# Patient Record
Sex: Male | Born: 1935 | Race: White | Hispanic: No | Smoking: Former smoker
Health system: Southern US, Community
[De-identification: ages and names within clinical notes are randomized; demographics above are authoritative.]

## PROBLEM LIST (undated history)

## (undated) DIAGNOSIS — E871 Hypo-osmolality and hyponatremia: Secondary | ICD-10-CM

## (undated) DIAGNOSIS — Z789 Other specified health status: Secondary | ICD-10-CM

## (undated) DIAGNOSIS — J439 Emphysema, unspecified: Secondary | ICD-10-CM

## (undated) DIAGNOSIS — N183 Chronic kidney disease, stage 3 (moderate): Secondary | ICD-10-CM

## (undated) DIAGNOSIS — I10 Essential (primary) hypertension: Secondary | ICD-10-CM

## (undated) DIAGNOSIS — I2510 Atherosclerotic heart disease of native coronary artery without angina pectoris: Secondary | ICD-10-CM

## (undated) DIAGNOSIS — E785 Hyperlipidemia, unspecified: Secondary | ICD-10-CM

## (undated) DIAGNOSIS — I700 Atherosclerosis of aorta: Secondary | ICD-10-CM

## (undated) DIAGNOSIS — C3492 Malignant neoplasm of unspecified part of left bronchus or lung: Secondary | ICD-10-CM

## (undated) DIAGNOSIS — F419 Anxiety disorder, unspecified: Secondary | ICD-10-CM

## (undated) DIAGNOSIS — C3490 Malignant neoplasm of unspecified part of unspecified bronchus or lung: Secondary | ICD-10-CM

## (undated) HISTORY — DX: Emphysema, unspecified: J43.9

## (undated) HISTORY — DX: Hypo-osmolality and hyponatremia: E87.1

## (undated) HISTORY — DX: Atherosclerosis of aorta: I70.0

## (undated) HISTORY — DX: Hyperlipidemia, unspecified: E78.5

## (undated) HISTORY — DX: Malignant neoplasm of unspecified part of unspecified bronchus or lung: C34.90

## (undated) HISTORY — DX: Essential (primary) hypertension: I10

## (undated) HISTORY — DX: Malignant neoplasm of unspecified part of left bronchus or lung: C34.92

## (undated) HISTORY — DX: Other specified health status: Z78.9

## (undated) HISTORY — PX: CORONARY STENT PLACEMENT: SHX1402

## (undated) HISTORY — DX: Anxiety disorder, unspecified: F41.9

## (undated) HISTORY — DX: Atherosclerotic heart disease of native coronary artery without angina pectoris: I25.10

## (undated) HISTORY — PX: PNEUMONECTOMY: SHX168

## (undated) HISTORY — DX: Chronic kidney disease, stage 3 (moderate): N18.3

---

## 2004-02-18 DIAGNOSIS — C3490 Malignant neoplasm of unspecified part of unspecified bronchus or lung: Secondary | ICD-10-CM

## 2004-02-18 HISTORY — DX: Malignant neoplasm of unspecified part of unspecified bronchus or lung: C34.90

## 2004-09-02 ENCOUNTER — Ambulatory Visit: Payer: Self-pay | Admitting: Internal Medicine

## 2004-09-09 ENCOUNTER — Ambulatory Visit: Payer: Self-pay | Admitting: Internal Medicine

## 2004-09-10 ENCOUNTER — Ambulatory Visit: Payer: Self-pay | Admitting: Cardiology

## 2004-09-23 ENCOUNTER — Ambulatory Visit: Payer: Self-pay | Admitting: Internal Medicine

## 2004-09-26 ENCOUNTER — Ambulatory Visit: Admission: RE | Admit: 2004-09-26 | Discharge: 2004-09-26 | Payer: Self-pay | Admitting: Internal Medicine

## 2004-09-26 ENCOUNTER — Encounter (INDEPENDENT_AMBULATORY_CARE_PROVIDER_SITE_OTHER): Payer: Self-pay | Admitting: Specialist

## 2004-09-26 ENCOUNTER — Ambulatory Visit: Payer: Self-pay | Admitting: Internal Medicine

## 2004-10-02 ENCOUNTER — Ambulatory Visit: Payer: Self-pay | Admitting: Internal Medicine

## 2004-10-18 ENCOUNTER — Ambulatory Visit (HOSPITAL_COMMUNITY): Admission: RE | Admit: 2004-10-18 | Discharge: 2004-10-18 | Payer: Self-pay | Admitting: Thoracic Surgery

## 2004-10-22 ENCOUNTER — Ambulatory Visit (HOSPITAL_COMMUNITY): Admission: RE | Admit: 2004-10-22 | Discharge: 2004-10-22 | Payer: Self-pay | Admitting: Thoracic Surgery

## 2004-10-22 ENCOUNTER — Encounter (INDEPENDENT_AMBULATORY_CARE_PROVIDER_SITE_OTHER): Payer: Self-pay | Admitting: *Deleted

## 2004-10-24 ENCOUNTER — Ambulatory Visit: Payer: Self-pay | Admitting: Internal Medicine

## 2004-12-04 ENCOUNTER — Encounter: Admission: RE | Admit: 2004-12-04 | Discharge: 2004-12-04 | Payer: Self-pay | Admitting: Thoracic Surgery

## 2004-12-12 ENCOUNTER — Ambulatory Visit: Payer: Self-pay | Admitting: Internal Medicine

## 2005-01-16 ENCOUNTER — Ambulatory Visit (HOSPITAL_COMMUNITY): Admission: RE | Admit: 2005-01-16 | Discharge: 2005-01-16 | Payer: Self-pay | Admitting: Internal Medicine

## 2005-02-19 ENCOUNTER — Ambulatory Visit: Payer: Self-pay | Admitting: Internal Medicine

## 2005-02-21 ENCOUNTER — Inpatient Hospital Stay (HOSPITAL_COMMUNITY): Admission: RE | Admit: 2005-02-21 | Discharge: 2005-02-27 | Payer: Self-pay | Admitting: Thoracic Surgery

## 2005-02-21 ENCOUNTER — Encounter (INDEPENDENT_AMBULATORY_CARE_PROVIDER_SITE_OTHER): Payer: Self-pay | Admitting: *Deleted

## 2005-03-05 ENCOUNTER — Encounter: Admission: RE | Admit: 2005-03-05 | Discharge: 2005-03-05 | Payer: Self-pay | Admitting: Thoracic Surgery

## 2005-03-26 ENCOUNTER — Encounter: Admission: RE | Admit: 2005-03-26 | Discharge: 2005-03-26 | Payer: Self-pay | Admitting: Thoracic Surgery

## 2005-04-07 ENCOUNTER — Ambulatory Visit: Payer: Self-pay | Admitting: Internal Medicine

## 2005-04-16 ENCOUNTER — Encounter: Admission: RE | Admit: 2005-04-16 | Discharge: 2005-04-16 | Payer: Self-pay | Admitting: Thoracic Surgery

## 2005-05-20 LAB — COMPREHENSIVE METABOLIC PANEL
ALT: 10 U/L (ref 0–40)
Albumin: 4.1 g/dL (ref 3.5–5.2)
BUN: 16 mg/dL (ref 6–23)
CO2: 22 mEq/L (ref 19–32)
Calcium: 9.3 mg/dL (ref 8.4–10.5)
Chloride: 102 mEq/L (ref 96–112)
Creatinine, Ser: 0.9 mg/dL (ref 0.4–1.5)
Potassium: 6.5 mEq/L (ref 3.5–5.3)

## 2005-05-20 LAB — CBC WITH DIFFERENTIAL/PLATELET
Basophils Absolute: 0.1 10*3/uL (ref 0.0–0.1)
HCT: 38.4 % — ABNORMAL LOW (ref 38.7–49.9)
HGB: 13.1 g/dL (ref 13.0–17.1)
MONO#: 1.1 10*3/uL — ABNORMAL HIGH (ref 0.1–0.9)
NEUT#: 5.1 10*3/uL (ref 1.5–6.5)
NEUT%: 64.3 % (ref 40.0–75.0)
RDW: 17 % — ABNORMAL HIGH (ref 11.2–14.6)
WBC: 7.9 10*3/uL (ref 4.0–10.0)
lymph#: 1.6 10*3/uL (ref 0.9–3.3)

## 2005-05-28 ENCOUNTER — Encounter: Admission: RE | Admit: 2005-05-28 | Discharge: 2005-05-28 | Payer: Self-pay | Admitting: Thoracic Surgery

## 2005-06-11 ENCOUNTER — Ambulatory Visit (HOSPITAL_COMMUNITY): Admission: RE | Admit: 2005-06-11 | Discharge: 2005-06-11 | Payer: Self-pay | Admitting: Internal Medicine

## 2005-06-15 ENCOUNTER — Ambulatory Visit: Payer: Self-pay | Admitting: Internal Medicine

## 2005-06-17 LAB — COMPREHENSIVE METABOLIC PANEL
ALT: 8 U/L (ref 0–40)
Albumin: 4.2 g/dL (ref 3.5–5.2)
CO2: 25 mEq/L (ref 19–32)
Calcium: 9.3 mg/dL (ref 8.4–10.5)
Chloride: 102 mEq/L (ref 96–112)
Creatinine, Ser: 1.1 mg/dL (ref 0.4–1.5)
Total Protein: 7.4 g/dL (ref 6.0–8.3)

## 2005-06-17 LAB — CBC WITH DIFFERENTIAL/PLATELET
BASO%: 0.3 % (ref 0.0–2.0)
HCT: 38.1 % — ABNORMAL LOW (ref 38.7–49.9)
MCHC: 34.2 g/dL (ref 32.0–35.9)
MONO#: 0.9 10*3/uL (ref 0.1–0.9)
RBC: 3.95 10*6/uL — ABNORMAL LOW (ref 4.20–5.71)
RDW: 21.3 % — ABNORMAL HIGH (ref 11.2–14.6)
WBC: 6.2 10*3/uL (ref 4.0–10.0)
lymph#: 1.1 10*3/uL (ref 0.9–3.3)

## 2005-07-21 ENCOUNTER — Ambulatory Visit (HOSPITAL_COMMUNITY): Admission: RE | Admit: 2005-07-21 | Discharge: 2005-07-21 | Payer: Self-pay | Admitting: Internal Medicine

## 2005-09-04 ENCOUNTER — Ambulatory Visit: Payer: Self-pay | Admitting: Internal Medicine

## 2005-09-09 LAB — COMPREHENSIVE METABOLIC PANEL
ALT: 9 U/L (ref 0–40)
Albumin: 4.2 g/dL (ref 3.5–5.2)
Alkaline Phosphatase: 52 U/L (ref 39–117)
Glucose, Bld: 90 mg/dL (ref 70–99)
Potassium: 4.4 mEq/L (ref 3.5–5.3)
Sodium: 138 mEq/L (ref 135–145)
Total Bilirubin: 0.6 mg/dL (ref 0.3–1.2)
Total Protein: 6.9 g/dL (ref 6.0–8.3)

## 2005-09-09 LAB — CBC WITH DIFFERENTIAL/PLATELET
BASO%: 0.4 % (ref 0.0–2.0)
Eosinophils Absolute: 0.2 10*3/uL (ref 0.0–0.5)
MCHC: 34.3 g/dL (ref 32.0–35.9)
MCV: 96.2 fL (ref 81.6–98.0)
MONO#: 0.7 10*3/uL (ref 0.1–0.9)
MONO%: 9.4 % (ref 0.0–13.0)
NEUT#: 4.9 10*3/uL (ref 1.5–6.5)
RBC: 4.42 10*6/uL (ref 4.20–5.71)
RDW: 13.6 % (ref 11.2–14.6)
WBC: 7.7 10*3/uL (ref 4.0–10.0)

## 2005-09-11 ENCOUNTER — Ambulatory Visit (HOSPITAL_COMMUNITY): Admission: RE | Admit: 2005-09-11 | Discharge: 2005-09-11 | Payer: Self-pay | Admitting: Internal Medicine

## 2005-12-05 ENCOUNTER — Ambulatory Visit: Payer: Self-pay | Admitting: Internal Medicine

## 2005-12-09 LAB — CBC WITH DIFFERENTIAL/PLATELET
Eosinophils Absolute: 0.3 10*3/uL (ref 0.0–0.5)
MONO#: 0.8 10*3/uL (ref 0.1–0.9)
NEUT#: 3.9 10*3/uL (ref 1.5–6.5)
Platelets: 298 10*3/uL (ref 145–400)
RBC: 4.42 10*6/uL (ref 4.20–5.71)
RDW: 14.3 % (ref 11.2–14.6)
WBC: 6.4 10*3/uL (ref 4.0–10.0)
lymph#: 1.5 10*3/uL (ref 0.9–3.3)

## 2005-12-09 LAB — COMPREHENSIVE METABOLIC PANEL
ALT: 9 U/L (ref 0–40)
Albumin: 4.1 g/dL (ref 3.5–5.2)
CO2: 27 mEq/L (ref 19–32)
Glucose, Bld: 94 mg/dL (ref 70–99)
Potassium: 4.3 mEq/L (ref 3.5–5.3)
Sodium: 137 mEq/L (ref 135–145)
Total Protein: 6.8 g/dL (ref 6.0–8.3)

## 2005-12-11 ENCOUNTER — Ambulatory Visit (HOSPITAL_COMMUNITY): Admission: RE | Admit: 2005-12-11 | Discharge: 2005-12-11 | Payer: Self-pay | Admitting: Internal Medicine

## 2006-03-04 ENCOUNTER — Ambulatory Visit: Payer: Self-pay | Admitting: Internal Medicine

## 2006-03-04 ENCOUNTER — Ambulatory Visit: Payer: Self-pay | Admitting: Cardiology

## 2006-03-04 ENCOUNTER — Observation Stay (HOSPITAL_COMMUNITY): Admission: EM | Admit: 2006-03-04 | Discharge: 2006-03-05 | Payer: Self-pay | Admitting: Emergency Medicine

## 2006-03-09 ENCOUNTER — Ambulatory Visit: Payer: Self-pay

## 2006-03-09 LAB — CBC WITH DIFFERENTIAL/PLATELET
BASO%: 0.2 % (ref 0.0–2.0)
EOS%: 1.2 % (ref 0.0–7.0)
HCT: 48.1 % (ref 38.7–49.9)
MCH: 32.7 pg (ref 28.0–33.4)
MCHC: 34.1 g/dL (ref 32.0–35.9)
MONO#: 0.8 10*3/uL (ref 0.1–0.9)
NEUT%: 72.1 % (ref 40.0–75.0)
RDW: 13.5 % (ref 11.2–14.6)
WBC: 8 10*3/uL (ref 4.0–10.0)
lymph#: 1.3 10*3/uL (ref 0.9–3.3)

## 2006-03-09 LAB — COMPREHENSIVE METABOLIC PANEL
CO2: 28 mEq/L (ref 19–32)
Creatinine, Ser: 1.3 mg/dL (ref 0.40–1.50)
Glucose, Bld: 92 mg/dL (ref 70–99)
Total Bilirubin: 0.8 mg/dL (ref 0.3–1.2)

## 2006-03-11 ENCOUNTER — Ambulatory Visit (HOSPITAL_COMMUNITY): Admission: RE | Admit: 2006-03-11 | Discharge: 2006-03-11 | Payer: Self-pay | Admitting: Internal Medicine

## 2006-03-12 ENCOUNTER — Ambulatory Visit: Payer: Self-pay | Admitting: Cardiology

## 2006-03-12 LAB — CONVERTED CEMR LAB
BUN: 13 mg/dL (ref 6–23)
Calcium: 9.8 mg/dL (ref 8.4–10.5)
Chloride: 99 meq/L (ref 96–112)
Creatinine, Ser: 1.4 mg/dL (ref 0.4–1.5)
INR: 1 (ref 0.9–2.0)
MCV: 96.3 fL (ref 78.0–100.0)
Prothrombin Time: 12.1 s (ref 10.0–14.0)
RDW: 12.7 % (ref 11.5–14.6)
Sodium: 137 meq/L (ref 135–145)
WBC: 8.9 10*3/uL (ref 4.5–10.5)
aPTT: 32.1 s (ref 26.5–36.5)

## 2006-03-13 ENCOUNTER — Inpatient Hospital Stay (HOSPITAL_BASED_OUTPATIENT_CLINIC_OR_DEPARTMENT_OTHER): Admission: RE | Admit: 2006-03-13 | Discharge: 2006-03-13 | Payer: Self-pay | Admitting: Cardiology

## 2006-03-13 ENCOUNTER — Ambulatory Visit: Payer: Self-pay | Admitting: Cardiology

## 2006-03-13 DIAGNOSIS — I2510 Atherosclerotic heart disease of native coronary artery without angina pectoris: Secondary | ICD-10-CM | POA: Insufficient documentation

## 2006-03-13 HISTORY — DX: Atherosclerotic heart disease of native coronary artery without angina pectoris: I25.10

## 2006-03-17 ENCOUNTER — Ambulatory Visit: Payer: Self-pay | Admitting: Cardiology

## 2006-03-17 ENCOUNTER — Inpatient Hospital Stay (HOSPITAL_COMMUNITY): Admission: AD | Admit: 2006-03-17 | Discharge: 2006-03-18 | Payer: Self-pay | Admitting: Cardiology

## 2006-03-17 DIAGNOSIS — Z9861 Coronary angioplasty status: Secondary | ICD-10-CM

## 2006-03-18 DIAGNOSIS — I10 Essential (primary) hypertension: Secondary | ICD-10-CM

## 2006-03-18 HISTORY — DX: Essential (primary) hypertension: I10

## 2006-03-30 ENCOUNTER — Ambulatory Visit: Payer: Self-pay | Admitting: Cardiology

## 2006-06-03 ENCOUNTER — Ambulatory Visit: Payer: Self-pay | Admitting: Internal Medicine

## 2006-06-08 LAB — CBC WITH DIFFERENTIAL/PLATELET
Basophils Absolute: 0 10*3/uL (ref 0.0–0.1)
Eosinophils Absolute: 0.1 10*3/uL (ref 0.0–0.5)
HCT: 44.1 % (ref 38.7–49.9)
LYMPH%: 18.1 % (ref 14.0–48.0)
MONO#: 1.1 10*3/uL — ABNORMAL HIGH (ref 0.1–0.9)
NEUT#: 3.9 10*3/uL (ref 1.5–6.5)
NEUT%: 62.3 % (ref 40.0–75.0)
Platelets: 255 10*3/uL (ref 145–400)
WBC: 6.2 10*3/uL (ref 4.0–10.0)

## 2006-06-08 LAB — COMPREHENSIVE METABOLIC PANEL
CO2: 27 mEq/L (ref 19–32)
Creatinine, Ser: 1.31 mg/dL (ref 0.40–1.50)
Glucose, Bld: 105 mg/dL — ABNORMAL HIGH (ref 70–99)
Total Bilirubin: 0.5 mg/dL (ref 0.3–1.2)
Total Protein: 7.3 g/dL (ref 6.0–8.3)

## 2006-06-10 ENCOUNTER — Ambulatory Visit (HOSPITAL_COMMUNITY): Admission: RE | Admit: 2006-06-10 | Discharge: 2006-06-10 | Payer: Self-pay | Admitting: Internal Medicine

## 2006-06-30 ENCOUNTER — Ambulatory Visit: Payer: Self-pay | Admitting: Internal Medicine

## 2006-06-30 LAB — CONVERTED CEMR LAB
BUN: 11 mg/dL (ref 6–23)
Creatinine, Ser: 1.4 mg/dL (ref 0.4–1.5)
Potassium: 4.1 meq/L (ref 3.5–5.1)

## 2006-09-30 ENCOUNTER — Ambulatory Visit: Payer: Self-pay | Admitting: Internal Medicine

## 2006-10-06 LAB — COMPREHENSIVE METABOLIC PANEL
Albumin: 4.3 g/dL (ref 3.5–5.2)
CO2: 25 mEq/L (ref 19–32)
Calcium: 9.6 mg/dL (ref 8.4–10.5)
Chloride: 103 mEq/L (ref 96–112)
Glucose, Bld: 77 mg/dL (ref 70–99)
Potassium: 4.6 mEq/L (ref 3.5–5.3)
Sodium: 136 mEq/L (ref 135–145)
Total Bilirubin: 0.5 mg/dL (ref 0.3–1.2)
Total Protein: 7 g/dL (ref 6.0–8.3)

## 2006-10-06 LAB — CBC WITH DIFFERENTIAL/PLATELET
Eosinophils Absolute: 0.1 10*3/uL (ref 0.0–0.5)
HCT: 40.5 % (ref 38.7–49.9)
LYMPH%: 15.1 % (ref 14.0–48.0)
MONO#: 0.8 10*3/uL (ref 0.1–0.9)
NEUT#: 6.3 10*3/uL (ref 1.5–6.5)
Platelets: 260 10*3/uL (ref 145–400)
RBC: 4.3 10*6/uL (ref 4.20–5.71)
WBC: 8.4 10*3/uL (ref 4.0–10.0)

## 2006-10-12 ENCOUNTER — Ambulatory Visit (HOSPITAL_COMMUNITY): Admission: RE | Admit: 2006-10-12 | Discharge: 2006-10-12 | Payer: Self-pay | Admitting: Internal Medicine

## 2006-10-13 ENCOUNTER — Encounter: Payer: Self-pay | Admitting: Internal Medicine

## 2006-11-04 ENCOUNTER — Ambulatory Visit: Payer: Self-pay | Admitting: Cardiology

## 2006-11-04 LAB — CONVERTED CEMR LAB
BUN: 12 mg/dL (ref 6–23)
GFR calc Af Amer: 77 mL/min
GFR calc non Af Amer: 64 mL/min
Potassium: 4.4 meq/L (ref 3.5–5.1)
Sodium: 144 meq/L (ref 135–145)

## 2006-11-16 ENCOUNTER — Ambulatory Visit: Payer: Self-pay | Admitting: Cardiology

## 2006-11-30 ENCOUNTER — Ambulatory Visit: Payer: Self-pay | Admitting: Cardiology

## 2006-11-30 LAB — CONVERTED CEMR LAB
CO2: 30 meq/L (ref 19–32)
Chloride: 102 meq/L (ref 96–112)
Cholesterol: 202 mg/dL (ref 0–200)
Creatinine, Ser: 1.4 mg/dL (ref 0.4–1.5)
HDL: 80.1 mg/dL (ref >39.0)
Potassium: 4.5 meq/L (ref 3.5–5.1)
Sodium: 137 meq/L (ref 135–145)

## 2007-04-02 ENCOUNTER — Ambulatory Visit: Payer: Self-pay | Admitting: Internal Medicine

## 2007-04-06 ENCOUNTER — Ambulatory Visit (HOSPITAL_COMMUNITY): Admission: RE | Admit: 2007-04-06 | Discharge: 2007-04-06 | Payer: Self-pay | Admitting: Internal Medicine

## 2007-04-06 LAB — COMPREHENSIVE METABOLIC PANEL
Albumin: 3.8 g/dL (ref 3.5–5.2)
CO2: 26 mEq/L (ref 19–32)
Calcium: 9.2 mg/dL (ref 8.4–10.5)
Chloride: 100 mEq/L (ref 96–112)
Glucose, Bld: 109 mg/dL — ABNORMAL HIGH (ref 70–99)
Sodium: 133 mEq/L — ABNORMAL LOW (ref 135–145)
Total Bilirubin: 0.9 mg/dL (ref 0.3–1.2)
Total Protein: 7.1 g/dL (ref 6.0–8.3)

## 2007-04-06 LAB — CBC WITH DIFFERENTIAL/PLATELET
Eosinophils Absolute: 0.2 10*3/uL (ref 0.0–0.5)
HCT: 42.9 % (ref 38.7–49.9)
LYMPH%: 25.9 % (ref 14.0–48.0)
MONO#: 0.8 10*3/uL (ref 0.1–0.9)
NEUT#: 4.3 10*3/uL (ref 1.5–6.5)
Platelets: 280 10*3/uL (ref 145–400)
RBC: 4.57 10*6/uL (ref 4.20–5.71)
WBC: 7.1 10*3/uL (ref 4.0–10.0)

## 2007-04-13 ENCOUNTER — Encounter: Payer: Self-pay | Admitting: Internal Medicine

## 2007-10-01 ENCOUNTER — Ambulatory Visit: Payer: Self-pay | Admitting: Internal Medicine

## 2007-10-05 ENCOUNTER — Ambulatory Visit (HOSPITAL_COMMUNITY): Admission: RE | Admit: 2007-10-05 | Discharge: 2007-10-05 | Payer: Self-pay | Admitting: Internal Medicine

## 2007-10-05 LAB — CBC WITH DIFFERENTIAL/PLATELET
BASO%: 0.2 % (ref 0.0–2.0)
EOS%: 2.3 % (ref 0.0–7.0)
HCT: 40.8 % (ref 38.7–49.9)
LYMPH%: 12.9 % — ABNORMAL LOW (ref 14.0–48.0)
MCH: 33.2 pg (ref 28.0–33.4)
MCHC: 34.3 g/dL (ref 32.0–35.9)
NEUT%: 72 % (ref 40.0–75.0)
Platelets: 304 10*3/uL (ref 145–400)

## 2007-10-05 LAB — COMPREHENSIVE METABOLIC PANEL
ALT: 12 U/L (ref 0–53)
AST: 17 U/L (ref 0–37)
Alkaline Phosphatase: 48 U/L (ref 39–117)
Creatinine, Ser: 1.12 mg/dL (ref 0.40–1.50)
Total Bilirubin: 0.8 mg/dL (ref 0.3–1.2)

## 2007-10-11 ENCOUNTER — Encounter: Payer: Self-pay | Admitting: Internal Medicine

## 2007-11-01 ENCOUNTER — Ambulatory Visit: Payer: Self-pay | Admitting: Cardiology

## 2007-11-14 IMAGING — CT CT CHEST W/ CM
2 of 4 series · 15 of 36 positions shown, 18 images · IV contrast (omnipaque)
Comparison: Plain film 12/04/04, CT chest 09/10/04, and PET of 10/18/04.   Direct comparison will be made to the CT of 09/10/04.

CLINICAL DATA: Lung cancer.  Status post radiation therapy, undergoing chemotherapy.
CHEST CT WITH CONTRAST:
TECHNIQUE: Multidetector CT imaging of the chest was performed following the standard protocol during bolus administration of intravenous contrast.
Contrast:  80 cc Omnipaque 300.

[Series 2: chest_routine 5.0 b40f st · axial · 0.68mm/px · z∈[-311,-41]mm · 12 of 64 slices shown, 15 images]
[im 5/64  mediastinal]
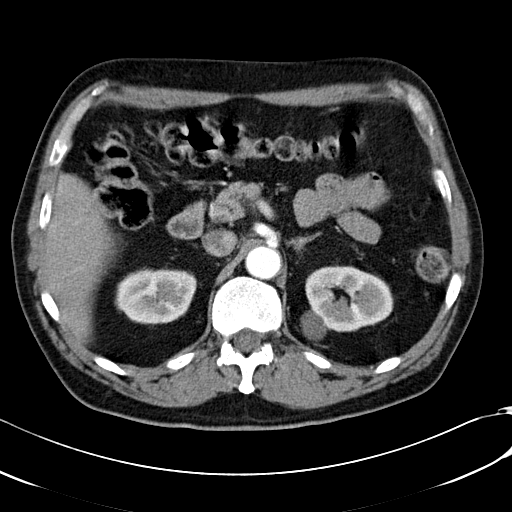
[im 5/64  lung]
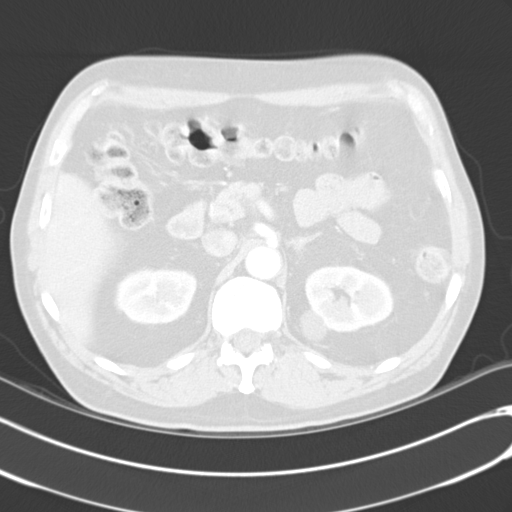
[im 9/64  lung]
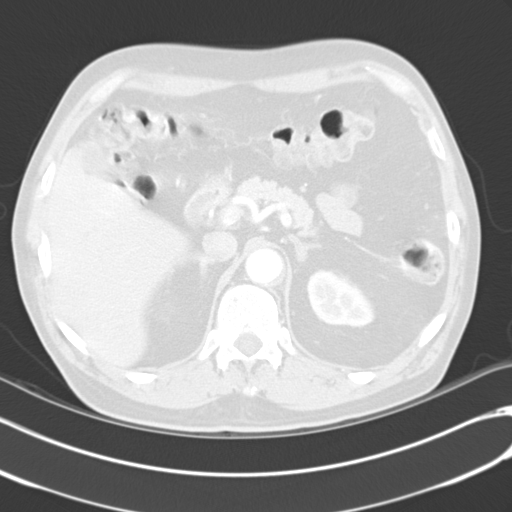
[im 13/64  lung]
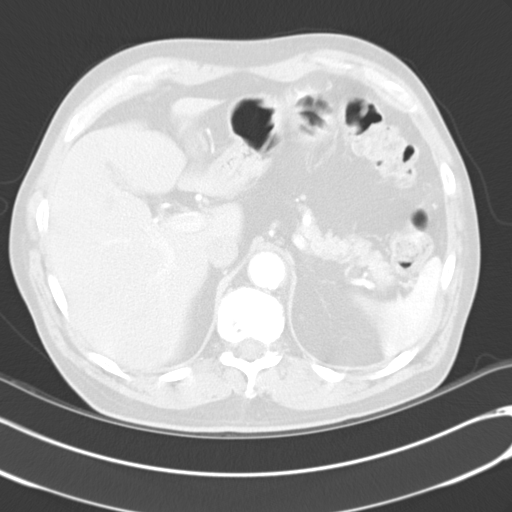
[im 22/64  lung]
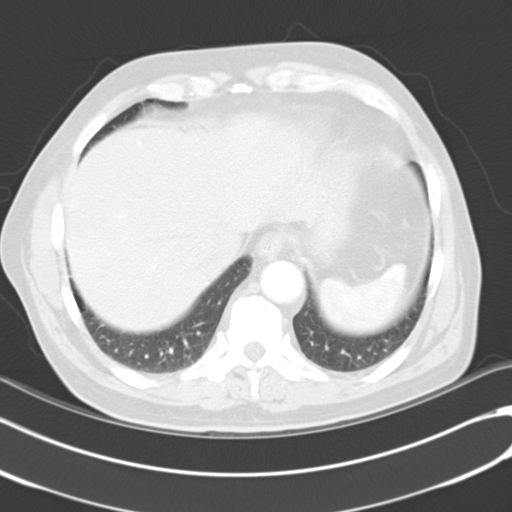
[im 26/64  mediastinal]
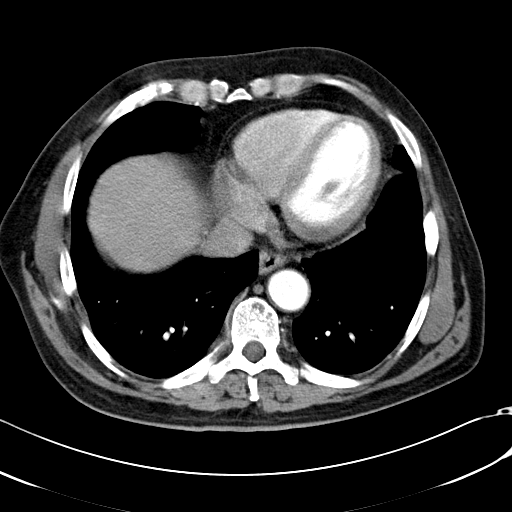
[im 26/64  lung]
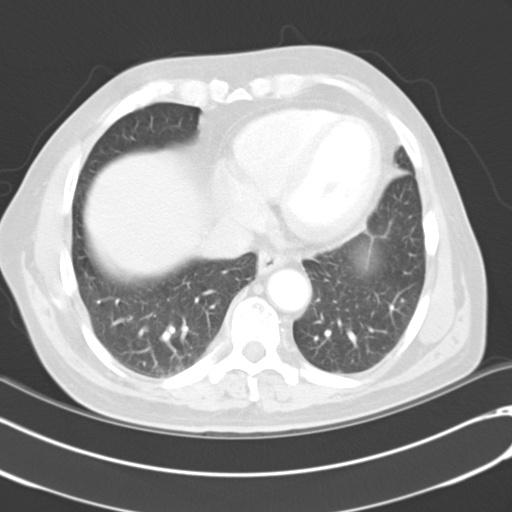
[im 30/64  lung]
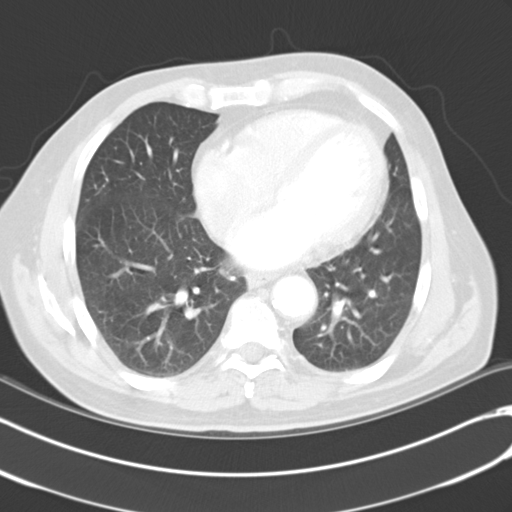
[im 34/64  lung]
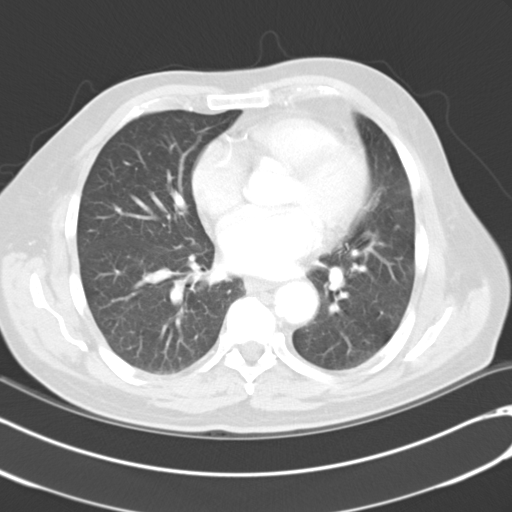
[im 38/64  lung]
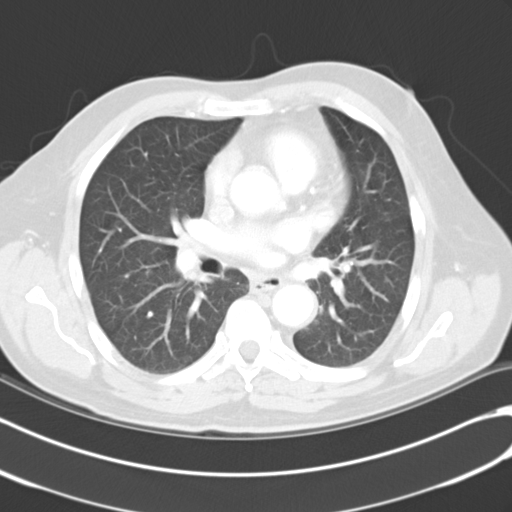
[im 43/64  mediastinal]
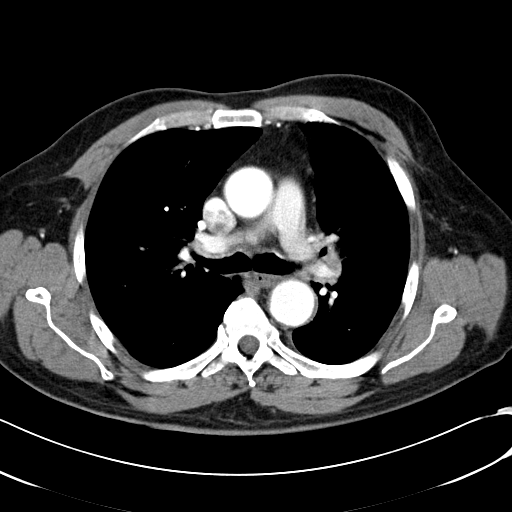
[im 43/64  lung]
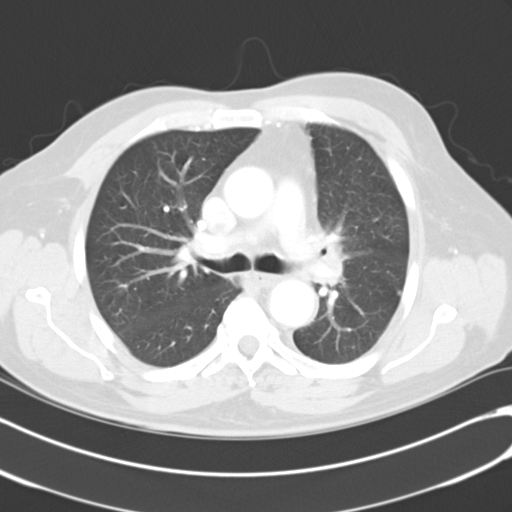
[im 51/64  lung]
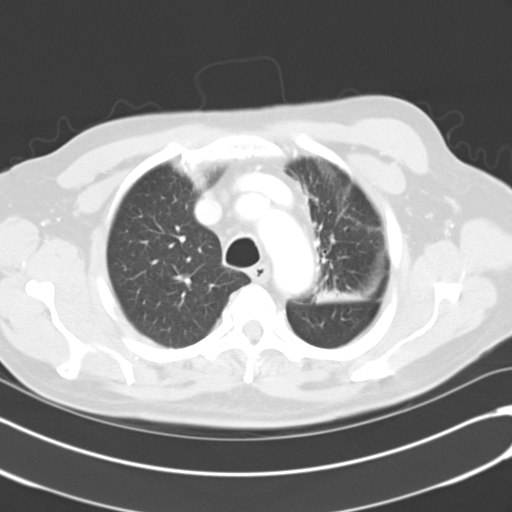
[im 55/64  lung]
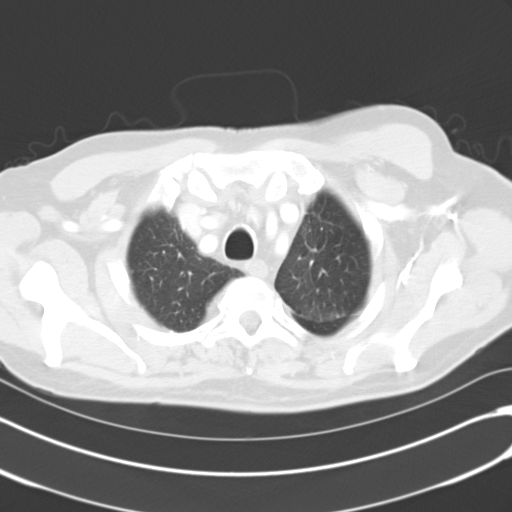
[im 59/64  lung]
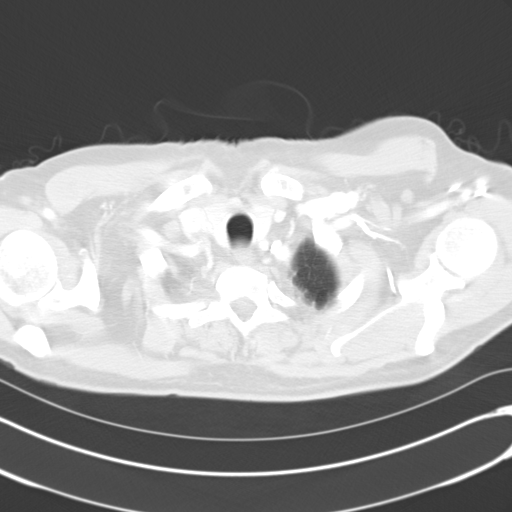

[Series 602: coronal · coronal · 0.68mm/px · 3 of 39 slices shown]
[im 8/39  lung]
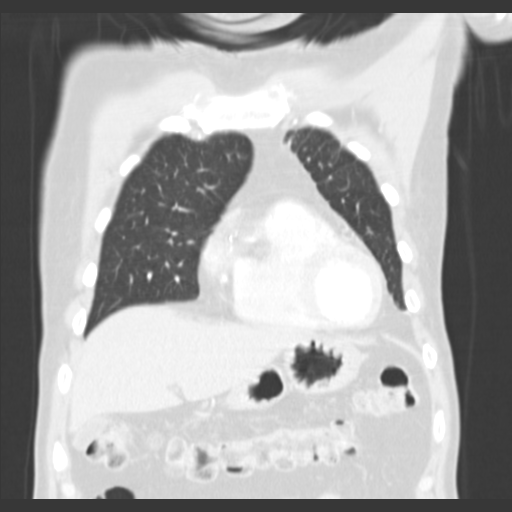
[im 16/39  lung]
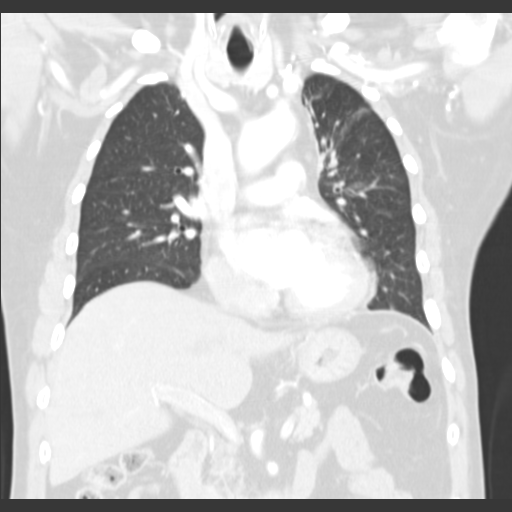
[im 23/39  lung]
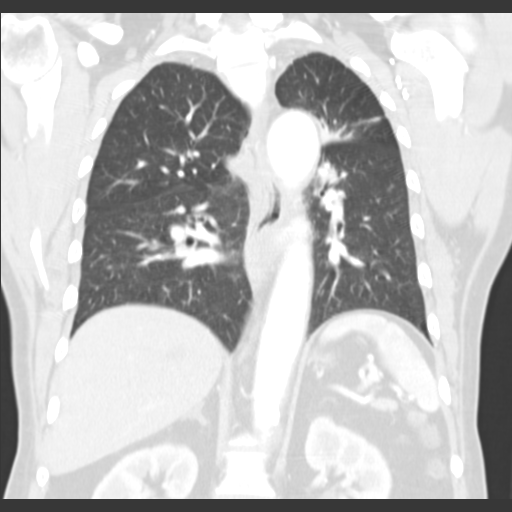

[15 of 36 positions shown; findings below may reference images not displayed]

FINDINGS: Aeration of the airways demonstrates decreased mass effect on the left upper lobe bronchus with similar mass effect on the lingular bronchus due to the tumor described previously.  There is central lobular emphysema.  The nodule along the right major fissure on image 32 today is similar to slightly smaller than on the prior exam and measures approximately 2 mm.  There is a granuloma more centrally in the superior segment right lower lobe.  There is some minimal subpleural nodularity in the left lung posteriorly measuring approximately 2 mm on image 22.  This was similar on the prior exam.  There has been interval improvement in the left upper lobe central mass.  Although somewhat difficult to measure due to its ill-defined nature, at its greatest transverse dimension, at the level of the left upper lobe bronchus, this currently measures 1.7 x 2.8 cm on image 23.  There is only minimal residual soft tissue anterior to the left upper lobe bronchus.  Previously, posterior to the upper lobe bronchus, this measured 2.3 x 3.4 cm with approximately 1.4 cm of tissue seen anterior to the left upper lobe bronchus.  There is improved postobstructive pneumonitis.
Soft tissue windows demonstrate normal heart size without pericardial effusion.  The previous CT described hypermetabolic AP window lymph node currently measures 1.1 by 1.2 cm and measured 1.5 x 1.7 previously.  There is right infrahilar nodule tissue, which is somewhat prominent on image 29 but similar to the prior exam.  There is no pleural effusion.  Limited imaging of the upper abdomen demonstrates old granulomas disease within the liver.  A left renal cyst but acute abnormality.  The adrenal glands are normal.  
Bone windows demonstrate no focal osseous lesion.
IMPRESSION: Overall response to therapy with decreased size of the left upper lobe mass and AP window lymph node.  No evidence of new lesions or metastatic disease.

## 2007-11-19 ENCOUNTER — Ambulatory Visit: Payer: Self-pay | Admitting: Cardiology

## 2007-11-19 LAB — CONVERTED CEMR LAB
BUN: 17 mg/dL (ref 6–23)
Chloride: 96 meq/L (ref 96–112)
Creatinine, Ser: 1.1 mg/dL (ref 0.4–1.5)
Direct LDL: 87.1 mg/dL
GFR calc Af Amer: 85 mL/min
GFR calc non Af Amer: 70 mL/min
HDL: 95 mg/dL (ref >39.0)
Potassium: 4.7 meq/L (ref 3.5–5.1)
Total CHOL/HDL Ratio: 2.2
VLDL: 10 mg/dL (ref 0–40)

## 2008-04-04 ENCOUNTER — Ambulatory Visit: Payer: Self-pay | Admitting: Internal Medicine

## 2008-04-06 ENCOUNTER — Ambulatory Visit (HOSPITAL_COMMUNITY): Admission: RE | Admit: 2008-04-06 | Discharge: 2008-04-06 | Payer: Self-pay | Admitting: Internal Medicine

## 2008-04-06 LAB — CBC WITH DIFFERENTIAL/PLATELET
BASO%: 0.4 % (ref 0.0–2.0)
EOS%: 3.6 % (ref 0.0–7.0)
HCT: 43.4 % (ref 38.7–49.9)
MCH: 32.7 pg (ref 28.0–33.4)
MCHC: 34.1 g/dL (ref 32.0–35.9)
NEUT%: 61.4 % (ref 40.0–75.0)
RBC: 4.53 10*6/uL (ref 4.20–5.71)
RDW: 13.8 % (ref 11.2–14.6)
lymph#: 1.6 10*3/uL (ref 0.9–3.3)

## 2008-04-06 LAB — COMPREHENSIVE METABOLIC PANEL
ALT: 14 U/L (ref 0–53)
AST: 20 U/L (ref 0–37)
Calcium: 9.5 mg/dL (ref 8.4–10.5)
Chloride: 103 mEq/L (ref 96–112)
Creatinine, Ser: 1.35 mg/dL (ref 0.40–1.50)
Sodium: 137 mEq/L (ref 135–145)
Total Protein: 7.3 g/dL (ref 6.0–8.3)

## 2008-04-12 ENCOUNTER — Encounter: Payer: Self-pay | Admitting: Internal Medicine

## 2008-04-23 DIAGNOSIS — E785 Hyperlipidemia, unspecified: Secondary | ICD-10-CM

## 2008-04-23 DIAGNOSIS — I498 Other specified cardiac arrhythmias: Secondary | ICD-10-CM

## 2008-04-23 DIAGNOSIS — C3492 Malignant neoplasm of unspecified part of left bronchus or lung: Secondary | ICD-10-CM

## 2008-04-23 HISTORY — DX: Hyperlipidemia, unspecified: E78.5

## 2008-04-23 HISTORY — DX: Malignant neoplasm of unspecified part of left bronchus or lung: C34.92

## 2008-04-24 ENCOUNTER — Encounter: Payer: Self-pay | Admitting: Cardiology

## 2008-04-24 ENCOUNTER — Ambulatory Visit: Payer: Self-pay | Admitting: Cardiology

## 2008-10-04 ENCOUNTER — Ambulatory Visit: Payer: Self-pay | Admitting: Internal Medicine

## 2008-10-06 ENCOUNTER — Ambulatory Visit (HOSPITAL_COMMUNITY): Admission: RE | Admit: 2008-10-06 | Discharge: 2008-10-06 | Payer: Self-pay | Admitting: Internal Medicine

## 2008-10-06 LAB — COMPREHENSIVE METABOLIC PANEL
ALT: 15 U/L (ref 0–53)
AST: 25 U/L (ref 0–37)
Albumin: 4.4 g/dL (ref 3.5–5.2)
CO2: 27 mEq/L (ref 19–32)
Calcium: 10 mg/dL (ref 8.4–10.5)
Chloride: 97 mEq/L (ref 96–112)
Potassium: 4.6 mEq/L (ref 3.5–5.3)
Sodium: 131 mEq/L — ABNORMAL LOW (ref 135–145)
Total Protein: 7.7 g/dL (ref 6.0–8.3)

## 2008-10-06 LAB — CBC WITH DIFFERENTIAL/PLATELET
Basophils Absolute: 0 10*3/uL (ref 0.0–0.1)
EOS%: 4.6 % (ref 0.0–7.0)
HCT: 44.5 % (ref 38.4–49.9)
HGB: 15.3 g/dL (ref 13.0–17.1)
LYMPH%: 17.4 % (ref 14.0–49.0)
MCH: 32.8 pg (ref 27.2–33.4)
MCV: 95 fL (ref 79.3–98.0)
MONO%: 11.6 % (ref 0.0–14.0)
NEUT%: 66.1 % (ref 39.0–75.0)
Platelets: 294 10*3/uL (ref 140–400)

## 2008-10-10 ENCOUNTER — Encounter: Payer: Self-pay | Admitting: Internal Medicine

## 2008-10-10 ENCOUNTER — Encounter: Payer: Self-pay | Admitting: Cardiology

## 2009-02-08 ENCOUNTER — Ambulatory Visit: Payer: Self-pay | Admitting: Internal Medicine

## 2009-02-08 DIAGNOSIS — R1033 Periumbilical pain: Secondary | ICD-10-CM | POA: Insufficient documentation

## 2009-02-08 DIAGNOSIS — G479 Sleep disorder, unspecified: Secondary | ICD-10-CM | POA: Insufficient documentation

## 2009-02-12 ENCOUNTER — Encounter (INDEPENDENT_AMBULATORY_CARE_PROVIDER_SITE_OTHER): Payer: Self-pay | Admitting: *Deleted

## 2009-02-12 LAB — CONVERTED CEMR LAB
AST: 23 units/L (ref 0–37)
Alkaline Phosphatase: 50 units/L (ref 39–117)
Amylase: 165 units/L — ABNORMAL HIGH (ref 27–131)
BUN: 19 mg/dL (ref 6–23)
Basophils Absolute: 0 10*3/uL (ref 0.0–0.1)
Bilirubin, Direct: 0 mg/dL (ref 0.0–0.3)
Eosinophils Absolute: 0.1 10*3/uL (ref 0.0–0.7)
Hemoglobin: 14.7 g/dL (ref 13.0–17.0)
Lipase: 46 units/L (ref 11.0–59.0)
Lymphocytes Relative: 16.4 % (ref 12.0–46.0)
MCHC: 33.8 g/dL (ref 30.0–36.0)
Monocytes Relative: 10 % (ref 3.0–12.0)
Neutro Abs: 5.6 10*3/uL (ref 1.4–7.7)
Neutrophils Relative %: 71.6 % (ref 43.0–77.0)
RBC: 4.38 M/uL (ref 4.22–5.81)
RDW: 12.6 % (ref 11.5–14.6)

## 2009-02-15 ENCOUNTER — Ambulatory Visit: Payer: Self-pay | Admitting: Internal Medicine

## 2009-02-15 LAB — CONVERTED CEMR LAB
OCCULT 1: NEGATIVE
OCCULT 2: NEGATIVE
OCCULT 3: NEGATIVE

## 2009-02-23 ENCOUNTER — Encounter (INDEPENDENT_AMBULATORY_CARE_PROVIDER_SITE_OTHER): Payer: Self-pay | Admitting: *Deleted

## 2009-04-02 ENCOUNTER — Ambulatory Visit: Payer: Self-pay | Admitting: Internal Medicine

## 2009-04-05 ENCOUNTER — Ambulatory Visit (HOSPITAL_COMMUNITY): Admission: RE | Admit: 2009-04-05 | Discharge: 2009-04-05 | Payer: Self-pay | Admitting: Internal Medicine

## 2009-04-05 LAB — CBC WITH DIFFERENTIAL/PLATELET
Eosinophils Absolute: 0.3 10*3/uL (ref 0.0–0.5)
HCT: 42.6 % (ref 38.4–49.9)
LYMPH%: 20.2 % (ref 14.0–49.0)
MCV: 92.2 fL (ref 79.3–98.0)
MONO#: 0.9 10*3/uL (ref 0.1–0.9)
NEUT#: 4.9 10*3/uL (ref 1.5–6.5)
NEUT%: 63 % (ref 39.0–75.0)
Platelets: 272 10*3/uL (ref 140–400)
WBC: 7.7 10*3/uL (ref 4.0–10.3)

## 2009-04-05 LAB — COMPREHENSIVE METABOLIC PANEL
BUN: 23 mg/dL (ref 6–23)
CO2: 27 mEq/L (ref 19–32)
Creatinine, Ser: 1.5 mg/dL (ref 0.40–1.50)
Glucose, Bld: 114 mg/dL — ABNORMAL HIGH (ref 70–99)
Total Bilirubin: 0.8 mg/dL (ref 0.3–1.2)

## 2009-04-12 ENCOUNTER — Encounter: Payer: Self-pay | Admitting: Cardiology

## 2009-05-07 ENCOUNTER — Ambulatory Visit: Payer: Self-pay | Admitting: Cardiology

## 2009-05-09 ENCOUNTER — Telehealth (INDEPENDENT_AMBULATORY_CARE_PROVIDER_SITE_OTHER): Payer: Self-pay | Admitting: *Deleted

## 2009-05-15 ENCOUNTER — Encounter: Payer: Self-pay | Admitting: Cardiology

## 2009-10-04 ENCOUNTER — Ambulatory Visit: Payer: Self-pay | Admitting: Internal Medicine

## 2009-10-08 ENCOUNTER — Ambulatory Visit (HOSPITAL_COMMUNITY): Admission: RE | Admit: 2009-10-08 | Discharge: 2009-10-08 | Payer: Self-pay | Admitting: Internal Medicine

## 2009-10-08 LAB — COMPREHENSIVE METABOLIC PANEL
ALT: 14 U/L (ref 0–53)
Alkaline Phosphatase: 36 U/L — ABNORMAL LOW (ref 39–117)
CO2: 26 mEq/L (ref 19–32)
Creatinine, Ser: 1.33 mg/dL (ref 0.40–1.50)
Glucose, Bld: 111 mg/dL — ABNORMAL HIGH (ref 70–99)
Total Bilirubin: 0.9 mg/dL (ref 0.3–1.2)

## 2009-10-08 LAB — CBC WITH DIFFERENTIAL/PLATELET
BASO%: 0.5 % (ref 0.0–2.0)
HCT: 40.8 % (ref 38.4–49.9)
LYMPH%: 18.7 % (ref 14.0–49.0)
MCHC: 34.8 g/dL (ref 32.0–36.0)
MCV: 95.2 fL (ref 79.3–98.0)
MONO#: 0.8 10*3/uL (ref 0.1–0.9)
MONO%: 12.1 % (ref 0.0–14.0)
NEUT%: 66.3 % (ref 39.0–75.0)
Platelets: 280 10*3/uL (ref 140–400)
RBC: 4.28 10*6/uL (ref 4.20–5.82)
WBC: 6.5 10*3/uL (ref 4.0–10.3)

## 2009-10-10 ENCOUNTER — Encounter: Payer: Self-pay | Admitting: Internal Medicine

## 2009-11-19 ENCOUNTER — Telehealth: Payer: Self-pay | Admitting: Cardiology

## 2009-11-26 ENCOUNTER — Ambulatory Visit: Payer: Self-pay | Admitting: Internal Medicine

## 2009-11-27 ENCOUNTER — Telehealth: Payer: Self-pay | Admitting: Cardiology

## 2010-03-09 ENCOUNTER — Other Ambulatory Visit: Payer: Self-pay | Admitting: Internal Medicine

## 2010-03-09 DIAGNOSIS — C3490 Malignant neoplasm of unspecified part of unspecified bronchus or lung: Secondary | ICD-10-CM

## 2010-03-10 ENCOUNTER — Encounter: Payer: Self-pay | Admitting: Thoracic Surgery

## 2010-03-10 ENCOUNTER — Encounter: Payer: Self-pay | Admitting: Internal Medicine

## 2010-03-17 LAB — CONVERTED CEMR LAB
Albumin: 4.3 g/dL (ref 3.5–5.2)
Alkaline Phosphatase: 46 units/L (ref 39–117)
Cholesterol: 219 mg/dL — ABNORMAL HIGH (ref 0–200)
HDL: 98.8 mg/dL (ref >39.00)
Total Protein: 7.7 g/dL (ref 6.0–8.3)
Triglycerides: 80 mg/dL (ref 0.0–149.0)
VLDL: 16 mg/dL (ref 0.0–40.0)

## 2010-03-19 NOTE — Progress Notes (Signed)
Summary: [facility] question re med   Phone Note From Other Clinic   Caller: [facility] [phone] Summary of Call: ref#[phone] re  Zandra Abts Initial call taken by: Glynda Jaeger,  November 27, 2009 4:13 PM  Follow-up for Phone Call        [facility] wanted to verify packs or tablets.  Pt has been getting the packs since 07/2009. Follow-up by: Charolotte Capuchin, RN,  November 27, 2009 4:56 PM

## 2010-03-19 NOTE — Letter (Signed)
[facility]   Imported By: Roderic Ovens 05/08/2009 13:32:45  _____________________________________________________________________  External Attachment:    Type:   Image     Comment:   External Document

## 2010-03-19 NOTE — Progress Notes (Signed)
Summary: Med Change   Phone Note Outgoing Call Call back at [phone]-Teresa (daughter)   Summary of Call: Left message for patient's daughter informing her per Dr.Hopper patient needs to stop Lorazepam and begin Doxepin for Insomnia, left number for patient's daughter to call back if any questions or concerns. RX will be sent to [facility].Dennard Nip)   Shonna Chock  May 09, 2009 3:33 PM

## 2010-03-19 NOTE — Assessment & Plan Note (Signed)
Summary: 1 yr rov f/u 414.01 pfh  Medications Added RANITIDINE HCL 300 MG CAPS (RANITIDINE HCL) 1 by mouth daily      Allergies Added: NKDA  Visit Type:  Follow-up Primary Provider:  Dr. Marga Melnick   History of Present Illness: The patient returns for yearly followup. Since I last saw him he has done well. He denies any chest pressure, neck or arm discomfort. He has had no palpitations, presyncope or syncope. He continues to be active doing some walking but staying active working in his yard. His biggest complaint has been insomnia.  Current Medications (verified): 1)  Pravastatin Sodium 80 Mg Tabs (Pravastatin Sodium) .... One By Mouth At Bedtime 2)  Ecotrin 325 Mg Tbec (Aspirin) .... One By Mouth Dialy 3)  Lisinopril 40 Mg Tabs (Lisinopril) .... One By Mouth Daily 4)  Hydrochlorothiazide 25 Mg Tabs (Hydrochlorothiazide) .Marland Kitchen.. 1 By Mouth Once Daily 5)  Ranitidine Hcl 300 Mg Caps (Ranitidine Hcl) .Marland Kitchen.. 1 By Mouth Daily 6)  Klor-Con 20 Meq Pack (Potassium Chloride) .Marland Kitchen.. 1 By Mouth Once Daily 7)  Lorazepam 0.5 Mg Tabs (Lorazepam) .Marland Kitchen.. 1 At Bedtime As Needed  Allergies (verified): No Known Drug Allergies  Past History:  Past Medical History: Reviewed history from 04/23/2008 and no changes required.  1. Coronary artery disease (left main normal; LAD 50% narrowing in the       mid section; circumflex was free of significant disease; the right       coronary artery had 80-90% stenosis in the mid section.  He had a       bare metal stent reducing this to zero percent.  This was in       January 2008.)   2. Hypertension.   3. Dyslipidemia.   4. Squamous cell cancer of the lung.  5. Bradycardia and beta-blocker treated.   Past Surgical History: Reviewed history from 04/23/2008 and no changes required. Status post pneumonectomy and chemotherapy in 2006.   Review of Systems       As stated in the HPI and negative for all other systems.   Vital Signs:  Patient profile:   76  year old male Height:      66 inches Weight:      167 pounds BMI:     27.05 Pulse rate:   70 / minute Resp:     16 per minute BP sitting:   118 / 68  (right arm)  Vitals Entered By: Marrion Coy, CNA (May 07, 2009 10:54 AM)  Physical Exam  General:  Well developed, well nourished, in no acute distress. Head:  normocephalic and atraumatic Eyes:  PERRLA/EOM intact; conjunctiva and lids normal. Mouth:  upper dentures, gums and palate normal. Oral mucosa normal. Neck:  Neck supple, no JVD. No masses, thyromegaly or abnormal cervical nodes. Chest Wall:  no deformities or breast masses noted Lungs:  diminished breath sounds without wheezing or crackles Heart:  Non-displaced PMI, chest non-tender; regular rate and rhythm, S1, S2 without murmurs, rubs or gallops. Carotid upstroke normal, no bruit. Normal abdominal aortic size, no bruits. Femorals normal pulses, no bruits. Pedals normal pulses. No edema, no varicosities. Abdomen:  Bowel sounds positive; abdomen soft and non-tender without masses, organomegaly, or hernias noted. No hepatosplenomegaly. Msk:  Back normal, normal gait. Muscle strength and tone normal. Extremities:  No clubbing or cyanosis. Neurologic:  Alert and oriented x 3. Skin:  Intact without lesions or rashes. Cervical Nodes:  no significant adenopathy Inguinal Nodes:  no significant adenopathy Psych:  Normal affect.   EKG  Procedure date:  05/07/2009  Findings:      sinus rhythm, rate 70, axis within normal limits, intervals within normal limits, no acute ST-T wave changes  Impression & Recommendations:  Problem # 1:  CAD (ICD-414.00)  He has had no new symptoms. I will continue with the meds as listed. He will continue with risk reduction.  Orders: EKG w/ Interpretation (93000)  Problem # 2:  SLEEP DISORDER (ICD-780.50) I sent a flag to Dr. Alwyn Ren as his recent treatment has not helped with this. I will defer to his management.  Problem # 3:   DYSLIPIDEMIA (ICD-272.4)  I reviewed lipids from September of 2009. His HDL was 95 with an LDL of 87. I will repeat this today as he is fasting.  Orders: TLB-Hepatic/Liver Function Pnl (80076-HEPATIC) TLB-Lipid Panel (80061-LIPID)  Problem # 4:  HYPERTENSION (ICD-401.9) The blood pressure is well controlled.  Patient Instructions: 1)  Your physician recommends that you schedule a follow-up appointment in: 1 year 2)  Your physician recommends that you continue on your current medications as directed. Please refer to the Current Medication list given to you today.

## 2010-03-19 NOTE — Assessment & Plan Note (Signed)
Summary: flu shot//ph   Nurse Visit   Allergies: No Known Drug Allergies  Orders Added: 1)  Flu Vaccine 49yrs + MEDICARE PATIENTS [Q2039] 2)  Administration Flu vaccine - MCR [G0008]  Flu Vaccine Consent Questions     Do you have a history of severe allergic reactions to this vaccine? no    Any prior history of allergic reactions to egg and/or gelatin? no    Do you have a sensitivity to the preservative Thimersol? no    Do you have a past history of Guillan-Barre Syndrome? no    Do you currently have an acute febrile illness? no    Have you ever had a severe reaction to latex? no    Vaccine information given and explained to patient? yes    Are you currently pregnant? no    Lot Number:[id number]   Exp Date:08/17/2010   Site Given  right Deltoid IM

## 2010-03-19 NOTE — Letter (Signed)
Summary: [facility]  [facility]   Imported By: Lanelle Bal 11/01/2009 09:22:45  _____________________________________________________________________  External Attachment:    Type:   Image     Comment:   External Document

## 2010-03-19 NOTE — Letter (Signed)
Summary: Appointment - Reminder 2  [facility], Main Office  [address]   [place], [place] [address]   Phone: [phone]  Fax: [phone]     February 23, 2009 MRN: [id number]   [name] [name] [address] [place], [place]  [address]   Dear [name],  Our records indicate that it is time to schedule a follow-up appointment.Dr.Hochrein recommended that you follow up with [place] in March,2011. It is very important that we reach you to schedule this appointment. We look forward to participating in your health care needs. Please contact us at the number listed above at your earliest convenience to schedule your appointment.  If you are unable to make an appointment at this time, give [place] a call so we can update our records.     Sincerely,   Glass blower/designer

## 2010-03-19 NOTE — Letter (Signed)
Summary: Custom - Lipid  [facility], Main Office  [address]   [place], [place] [address]   Phone: [phone]  Fax: [phone]         May 15, 2009 MRN: [id number]     [name] [name] [address] [place], [place]  [address]     Dear [name],  We have reviewed your cholesterol results.  They are as follows:     Total Cholesterol:    219 (Desirable: less than 200)       HDL  Cholesterol:     98.80  (Desirable: greater than 40 for men and 50 for women)       LDL Cholesterol:       DEL  (Desirable: less than 100 for low risk and less than 70 for moderate to high risk)       Triglycerides:       80.0  (Desirable: less than 150)  Our recommendations include:  Looks [place]!  No changes   Call our office at the number listed above if you have any questions.  Lowering your LDL cholesterol is important, but it is only one of a large number of "risk factors" that may indicate that you are at risk for heart disease, stroke or other complications of hardening of the arteries.  Other risk factors include:   A.  Cigarette Smoking* B.  High Blood Pressure* C.  Obesity* D.   Low HDL Cholesterol (see yours above)* E.   Diabetes Mellitus (higher risk if your is uncontrolled) F.  Family history of premature heart disease G.  Previous history of stroke or cardiovascular disease        *These are risk factors YOU HAVE CONTROL OVER.  For more information, visit .  There is now evidence that lowering the TOTAL CHOLESTEROL AND LDL CHOLESTEROL can reduce the risk of heart disease.  The American Heart Association recommends the following guidelines for the treatment of elevated cholesterol:  1.  If there is now current heart disease and less than two risk factors, TOTAL CHOLESTEROL should be less than 200 and LDL CHOLESTEROL should be less than 100. 2.  If there is current heart disease or two or more risk factors, TOTAL CHOLESTEROL should be less than 200 and  LDL CHOLESTEROL should be less than 70.  A diet low in cholesterol, saturated fat, and calories is the cornerstone of treatment for elevated cholesterol.  Cessation of smoking and exercise are also important in the management of elevated cholesterol and preventing vascular disease.  Studies have shown that 30 to 60 minutes of physical activity most days can help lower blood pressure, lower cholesterol, and keep your weight at a healthy level.  Drug therapy is used when cholesterol levels do not respond to therapeutic lifestyle changes (smoking cessation, diet, and exercise) and remains unacceptably high.  If medication is started, it is important to have you levels checked periodically to evaluate the need for further treatment options.      Thank you,   Sander Nephew, RN for Dr Rollene Rotunda [facility] Team

## 2010-03-19 NOTE — Progress Notes (Signed)
Summary: refill request   Phone Note Refill Request Message from:  Patient on November 19, 2009 2:47 PM  Refills Requested: Medication #1:  KLOR-CON 20 MEQ PACK 1 by mouth once daily [facility] mail order   Method Requested: Telephone to Pharmacy Initial call taken by: Glynda Jaeger,  November 19, 2009 2:47 PM Caller: Daughter Rosey Bath [phone] Reason for Call: Talk to Nurse Summary of Call: khlor con   Follow-up for Phone Call        [facility] into [facility]. Pt notified. Marrion Coy, CNA  November 19, 2009 3:16 PM  Follow-up by: Marrion Coy, CNA,  November 19, 2009 3:16 PM    Prescriptions: KLOR-CON 20 MEQ PACK (POTASSIUM CHLORIDE) 1 by mouth once daily  #90 x 1   Entered by:   Marrion Coy, CNA   Authorized by:   Rollene Rotunda, MD, [facility]   Signed by:   Marrion Coy, CNA on 11/19/2009   Method used:   Electronically to        [facility]* (retail)             ,          Ph: [phone]       Fax: [phone]   RxID:   [id number]

## 2010-06-17 ENCOUNTER — Other Ambulatory Visit: Payer: Self-pay | Admitting: Cardiology

## 2010-07-02 NOTE — Assessment & Plan Note (Signed)
[facility]                            CARDIOLOGY OFFICE NOTE   NAME:[name], [name] MILLHOUSE                        MRN:          [id number]  DATE:11/01/2007                            DOB:          Jul 13, 1934    PRIMARY CARE PHYSICIAN:  [name] Dubin. Alwyn Ren, MD,FACP,FCCP.   REASON FOR PRESENTATION:  Evaluate the patient with coronary disease and  hypertension.   HISTORY OF PRESENT ILLNESS:  The patient is a pleasant 76 year old  gentleman with coronary disease as described below.  It has been 1 year  since I last saw him.  He has done well.  He has had no chest discomfort  or epigastric discomfort which was his previous angina.  He is out doing  a lot of yard work which includes cutting wood.  With this, he gets no  chest pressure, neck or arm discomfort.  He has had no palpitations,  presyncope or syncope.  He has no shortness of breath.  Denies any PND  or orthopnea.  He is not walking or exercising routinely although he is  again very active.   PAST MEDICAL HISTORY:  Coronary artery disease (left main normal, LAD  50% stenosis in the mid section, circumflex was free of significant  disease, the right coronary artery had 80-90% mid stenosis.  He had a  bare metal stent placed reducing this to 0% stenosis.  This was in  January 2008), hypertension, dyslipidemia, squamous cell cancer of the  lung status post pneumonectomy and chemotherapy in 2006, and bradycardia  on beta-blockers in the past.   ALLERGIES AND INTOLERANCES:  None.   MEDICATIONS:  1. Pravastatin 80 mg daily.  2. Lisinopril and hydrochlorothiazide 20/12.5 daily.  3. Aspirin 325 mg daily.   REVIEW OF SYSTEMS:  As stated in the HPI and otherwise negative for  other systems.   PHYSICAL EXAMINATION:  GENERAL:  The patient is in no distress.  VITALS:  Blood pressure 183/92, heart rate 66 and regular, weight 160  pounds, and body mass index 26.  HEENT:  Eyes unremarkable.  Pupils equal, round,  and reactive to light.  Fundi not visualized.  Oral mucosa unremarkable.  NECK:  No jugular venous distention at 45 degrees.  Carotid upstroke  brisk and symmetrical.  No bruits.  No thyromegaly.  LYMPHATICS:  No  cervical, axillary, or inguinal adenopathy.  LUNGS:  Clear to auscultation bilaterally.  BACK:  No costovertebral angle tenderness.  CHEST:  Unremarkable.  HEART:  PMI not displaced or sustained, S1 and S2 within normal limits.  No S3, no S4.  No clicks, no rubs, no murmurs.  ABDOMEN:  Flat, positive  bowel sounds normal in frequency and pitch.  No bruits, no rebound, no  guarding.  No midline pulsatile mass.  No organomegaly.  No  splenomegaly.  SKIN:  No rashes, no nodules.  EXTREMITIES:  2+ pulses throughout.  No edema, no cyanosis, no clubbing.  NEURO:  Oriented to person, place, and time.  Cranial nerves II through  XII grossly intact.  Motor grossly intact.   EKG, sinus rhythm, rate 73, axis  within normal limits, intervals within  normal limits, no acute ST-T wave changes.   ASSESSMENT AND PLAN:  1. Coronary artery disease.  The patient has had no symptoms related      to this.  We are going to continue the aggressive secondary risk      reduction.  No further cardiovascular testing is suggested.  2. Hypertension.  Blood pressure still not at target.  Therefore, I am      going to increase his lisinopril and hydrochlorothiazide to 40/25      daily.  I am going to give him potassium 10 mEq daily.  He is going      to get BMET in 2 weeks.  I have encouraged him to get a blood      pressure cuff or to keep a blood pressure at home.  3. Dyslipidemia.  When he comes back for his BMET in two weeks, he is      going to get lipid profile.  The goal would be an LDL less than 100      and HDL greater than 40.  4. Risk reduction.  I did discuss exercise again with him.  He is very      active, but I think he needs to increase his regular aerobic      exercise.  5. Followup.   I will see the patient back in 6 months given his      continued problems with blood pressure.     Rollene Rotunda, MD, [facility]  Electronically Signed    JH/MedQ  DD: 11/01/2007  DT: 11/02/2007  Job #: [id number]   cc:   [name] Dubin. Alwyn Ren, MD,FACP,FCCP

## 2010-07-02 NOTE — Assessment & Plan Note (Signed)
[facility]                            CARDIOLOGY OFFICE NOTE   NAME:Deavers, ASH MCELWAIN                        MRN:          [id number]  DATE:11/16/2006                            DOB:          1934-09-29    PRIMARY CARE PHYSICIAN:  [name] Dubin. Alwyn Ren, MD,FACP,FCCP.   REASON FOR PRESENTATION:  Evaluate patient with coronary disease and  hypertension.   HISTORY OF PRESENT ILLNESS:  The patient is a 76 years old.  He presents  for followup of the above.  He has done well since I last saw him.  He  is having trouble sleeping.  However, he remains active.  He is not  exercising but he did split wood for instance the other day.  With this,  he denies any chest discomfort or arm discomfort which was his previous  angina.  He denies any neck discomfort.  He has had no palpitations,  presyncope, or syncope.  He denies any shortness of breath.   PAST MEDICAL HISTORY:  1. Coronary artery disease (left main normal; LAD 50% narrowing in the      mid section; circumflex was free of significant disease; the right      coronary artery had 80-90% stenosis in the mid section.  He had a      bare metal stent reducing this to zero percent.  This was in      January 2008.)  2. Hypertension.  3. Dyslipidemia.  4. Squamous cell cancer of the lung, status post pneumonectomy and      chemotherapy in 2006.  5. Bradycardia and beta-blocker treated.   ALLERGIES:  None.   MEDICATIONS:  1. Ranitidine 300 mg daily.  2. Pravachol 40 mg daily.  3. Aspirin 325 mg daily.  4. Lotensin HCT 10/12.5 daily.   REVIEW OF SYSTEMS:  As stated in the HPI and otherwise negative for  other systems.   PHYSICAL EXAMINATION:  GENERAL:  The patient is in no distress.  VITAL SIGNS:  Blood pressure 140/90, heart rate 87 regular, weight 168  pounds, body mass index 26.  HEENT:  Eyelids unremarkable.  Pupils are equal, round, and reactive to  light .  Fundi not visualized.  Oral mucosa  unremarkable.  NECK:  No jugular venous distention.  Wave form within normal limits.  Carotid upstroke brisk and symmetric.  No bruits.  No thyromegaly.  LYMPHATICS:  No cervical, axillary, or inguinal adenopathy.  LUNGS:  Clear to auscultation bilaterally.  BACK:  No costovertebral angle tenderness.  CHEST:  Unremarkable.  HEART:  PMI not displaced or sustained.  S1 and S2 within normal limits.  No S3, no S4, no clicks, no rubs, no murmurs.  ABDOMEN:  Flat.  Positive bowel sounds, normal in frequency and pitch.  No bruits.  No rebound.  No guarding.  No midline pulsatile mass.  No  hepatomegaly.  No splenomegaly.  SKIN:  No rashes.  No nodules.  EXTREMITIES:  Two plus pulses throughout.  No edema, no cyanosis, no  clubbing.  NEUROLOGIC:  Oriented to person, place and time.  Cranial nerves II-XII  grossly intact.  Motor grossly intact.   EKG:  Sinus rhythm, rate 78, axis within normal limits, intervals within  normal limits.  No acute ST-T wave changes.   ASSESSMENT/PLAN:  1. Coronary disease.  The patient is doing well with respect to this.      No further cardiovascular testing is suggested.  He will continue      with secondary risk reduction.  2. Hypertension.  Blood pressure is still not well controlled, not      quite a target.  I will go ahead and take the liberty of increasing      his Lisinopril to 20/12.5 daily.  He will come back in 2 weeks for      a B-MET.  3. Dyslipidemia.  I will get a lipid profile when he comes back for      his B-MET.  4. Risk reduction.  I did discuss exercise regimen with him.  5. Insomnia.  I will take the liberty of going ahead and giving him      some Lunesta, a 85-month supply.  If this works and he wants it      refilled, I have asked him to talk with Dr. Alwyn Ren.  If he has      persistent problems despite this, he should also follow up with Dr.      Alwyn Ren.   FOLLOWUP:  I will see him back yearly.  I have encouraged him to follow  up  more routinely with Dr. Alwyn Ren for his blood pressure control and  lipid management.  I will review the next lipid profile and be happy to  be involved as needed.     Rollene Rotunda, MD, [facility]  Electronically Signed    JH/MedQ  DD: 11/16/2006  DT: 11/16/2006  Job #: [id number]   cc:   [name] Dubin. Alwyn Ren, MD,FACP,FCCP

## 2010-07-05 NOTE — Cardiovascular Report (Signed)
NAME:  [name] [name], [name] [name]                 ACCOUNT NO.:  [id number]   MEDICAL RECORD NO.:  [id number]          PATIENT TYPE:  OIB   LOCATION:  2807                         FACILITY:  [facility]   PHYSICIAN:  Everardo Beals. Juanda Chance, MD, FACCDATE OF BIRTH:  1935-01-23   DATE OF PROCEDURE:  03/17/2006  DATE OF DISCHARGE:                            CARDIAC CATHETERIZATION   CLINICAL HISTORY:  Mr. Schurman is 76 years old and had no prior history of  known heart disease.  He was hospitalized on January16 with chest pain  and ruled out for myocardial infarction.  He subsequently had a Myoview  scan which showed inferior ischemia.  I studied him in the outpatient  laboratory and was found to have a 90% stenosis in the proximal to mid  right coronary artery.  He also has lung cancer and last January had a  pneumonectomy followed by Chemotherapy for a non-small cell cancer.   PROCEDURE:  The procedure was followed by the right femoral artery using  arterial sheath and 6-French preformed coronary catheters.  A front wall  arterial puncture was performed and Omnipaque contrast was used.  The  patient was given Angiomax bolus with infusion and was given a 300 mg  load of Plavix this morning.  He had been put on Plavix last week as  well.  He also received enteric-coated aspirin since his procedure last  week.  We used a JR-4 6-French guiding catheter with side holes and a  Prowater wire.  We crossed the lesion with the wire without difficulty.  We predilated with 3.0 x 50 mm Maverick performing one inflation to 8  atmospheres for 30 seconds.  We then deployed a 3.5 x 16 mm Liberte  stent.  We had some difficulty advancing the stent to the lesion due to  tortuosity and some calcification of the vessel.  We were able to  position the stent and we deployed it with one inflation of 14  atmospheres for 30 seconds.  We then postdilated with a 4.0 x 12 mm  Quantum Maverick performing a total of 4 inflations up to 20  atmospheres  for 30 seconds.  We re-crossed the lesion for the last postdilatation.  The patient tolerated the procedure well and left the laboratory in  satisfactory condition.   RESULTS:  Initially, the stenosis in the proximal mid right coronary was  estimated at 90%.  Following stenting, this improved to 0%.   CONCLUSION:  Successful percutaneous coronary intervention of a lesion  in the proximal mid right coronary artery using a Liberte bare metal  stent with improvement in center narrowing from 90% to 0%.   DISPOSITION:  The patient was returned to 6500  for further observation.      Bruce Elvera Lennox Juanda Chance, MD, [facility]  Electronically Signed     BRB/MEDQ  D:  03/17/2006  T:  03/17/2006  Job:  [id number]   cc:   [name] Dubin. Alwyn Ren, MD,FACP,FCCP  Everardo Beals Juanda Chance, MD, [facility]

## 2010-07-05 NOTE — Discharge Summary (Signed)
NAME:  [name] [name], [name] [name]                 ACCOUNT NO.:  [id number]   MEDICAL RECORD NO.:  [id number]          PATIENT TYPE:  INP   LOCATION:  6523                         FACILITY:  [facility]   PHYSICIAN:  Bruce R. Juanda Chance, MD, FACCDATE OF BIRTH:  [birth year]   DATE OF ADMISSION:  03/17/2006  DATE OF DISCHARGE:  03/18/2006                               DISCHARGE SUMMARY   PRIMARY CARDIOLOGIST:  Dr. Rollene Rotunda.   PRIMARY CARE PHYSICIAN:  Dr. [name] Dubin. Hopper.   PROCEDURES PERFORMED DURING HOSPITALIZATION:  PCI and stenting to right  coronary artery using a Liberte stent 3.5 x 16 and 4.0 x 12, decreasing  RCA stenosis from 90% to 0%.   PRIMARY DIAGNOSIS:  Status post cardiac catheterization dated March 12, 2006, revealing coronary artery disease with 50% narrowing in the  mid left anterior descending, 80-90% stenosis in the mid right coronary  artery with normal left ventricular function by noninvasive studies per  Dr. Charlies Constable.   SECONDARY DIAGNOSES:  1. Hypertension.  2. Hyperlipidemia.  3. Ex-smoker.  4. History of squamous cell lung carcinoma.      a.     Status post left pneumonectomy.      b.     Status post chemotherapy.  5. Significant bradycardia with beta blockers while hospitalized.   HOSPITAL COURSE:  This is a very pleasant 76 year old Caucasian male who  was admitted on March 17, 2006 for a scheduled PCI of the right  coronary artery as assessed by cardiac catheterization completed on  March 13, 2006 by Dr. Juanda Chance.  The patient did well during the  procedure without any evidence of hematoma, bleeding or infection at the  cath site.  There were no arrhythmias seen.  There was no subsequent  chest pain, shortness of breath or any indications from the patient that  the procedure was detrimental to him.   The patient was resumed on his prior home medications on day of  discharge and examined by Dr. Juanda Chance on day of discharge, who felt he  was stable to go home.   The patient was given instructions on  postcardiac catheterization and need to followup.   DISCHARGE LABS:  Hemoglobin 14.2, hematocrit 41.8, white blood cell is  9.2, platelets 250.  PT 12.1, INR 1.0, PTT 32.1.  Sodium 137, potassium  4.3, chloride 103, CO2 29, glucose 91, BUN 14, creatinine 1.24.   VITAL SIGNS:  On discharge, blood pressure 118/72, pulse 66,  respirations 20, temperature 98.1 with an O2 saturation of 96%.   FOLLOW PLANS AND APPOINTMENT:  1. The patient has a previously scheduled appointment with Dr. Edwena Felty on February 11 at 4 p.m.  2. The patient has been given postcardiac catheterization instructions      with particular emphasis on right groin site for evidence of      hematoma, bleeding or signs of infection or severe pain.  3. The patient has been advised to continue his home medications      without any changes.   DISCHARGE MEDICATIONS:  1. Plavix 75  mg once a day.  2. Enteric-coated aspirin 325 once a day.  3. HCTZ 12.5 once a day.  4. Zocor 20 mg 1 p.o. q.h.s.   ALLERGIES:  CIPROFLOXACIN AND INTOLERANCE TO BETA BLOCKERS CAUSING  SEVERE BRADYCARDIA.   Time spent with the patient, to include physician time, 30 minutes.      Bettey Mare. Lyman Bishop, NP      Everardo Beals. Juanda Chance, MD, [facility]  Electronically Signed    KML/MEDQ  D:  03/18/2006  T:  03/18/2006  Job:  [id number]   cc:   [name] Dubin. Alwyn Ren, MD,FACP,FCCP

## 2010-07-05 NOTE — Op Note (Signed)
NAMESIEGFRIED, VIETH                 ACCOUNT NO.:  [id number]   MEDICAL RECORD NO.:  [id number]          PATIENT TYPE:  INP   LOCATION:  2550                         FACILITY:  [facility]   PHYSICIAN:  Ines Bloomer, M.D. DATE OF BIRTH:  February 02, 1935   DATE OF PROCEDURE:  02/21/2005  DATE OF DISCHARGE:                                 OPERATIVE REPORT   PREOPERATIVE DIAGNOSIS:  Left upper lobe hilar mass, status post  chemotherapy.   POSTOPERATIVE DIAGNOSIS:  Left upper lobe hilar mass, status post  chemotherapy.   OPERATION:  Left video-assisted thoracoscopic surgery, left thoracotomy with  node dissection.   SURGEON:  Ines Bloomer, M.D.   ASSISTANT:  Coral Ceo, P.A.-C.   ANESTHESIA:  General.   DESCRIPTION OF PROCEDURE:  After adequate general anesthesia the patient was  turned to the left lateral thoracotomy position.  Percutaneous arterial and  a CVP line were inserted.  A dual lumen tube was inserted.  The patient was  prepped and draped in the usual sterile manner.  This patient had presented  with a clinically stage IIIA non-small-cell lung cancer and underwent  chemotherapy, now returns for a resection, after there had been some down-  sizing with the chemotherapy.  A trocar site was made in the sixth  intercostal space at the anterior axillary line. A  trocar was inserted and  a 0-degree scope was inserted.  There were multiple adhesions of the upper  lobe to the chest wall, so we could not really tell whether it was  resectable or not using the scope, so we did a posterior lateral thoracotomy  through the fifth intercostal space, taking down the intercostal muscle  bundle from between the fifth and the sixth ribs, and then resecting the  fifth rib sub-pericostally.  A Tuffier was placed in the balloon and the  tumor was evaluated and it appeared that it was probably going to be  resectable.  Dissection was started in the left superior hilum and we  started  dissecting out the pulmonary artery and the superior pulmonary vein.  In resecting laterally around the pulmonary artery and the aortopulmonary  window, there were multiple nodes that were dissected free, and they were  dissected down laterally, and we got good multiple nodes both 10L and 11L,  and proximally we got the 5L and 4L nodes.  The inferior pulmonary ligament  was taken down.  The inferior pulmonary vein was looped with a vascular  tape.  Then attention was again turned superiorly and the pulmonary artery  was meticulously dissected out and looped with a vascular tape.  Finally the  superior pulmonary vein was dissected out and freed up and looped with a  vascular tape.  The pulmonary artery was clamped, with no change in the  pressure.  Then unclamped after 10 minutes.  The patient was given 125 mg of  Solu-Medrol.  Then the inferior pulmonary vein was stapled and divided with  the auto-suture #35 Roticulator.  Then the superior pulmonary vein was also  stapled and divided with the auto-suture #30 Roticulator.  Finally the  pulmonary artery was divided with the auto-suture #45  Roticulator.  The  bronchus was dissected up more again, dissecting out some 4L and 5 nodes,  completing dissecting all the nodes, and then stapling the bronchus with a  #55 access Ethicon stapler and dividing the bronchus.  The frozen section  revealed that it was squamous cell cancer with negative margins.  The  bronchus was reinforced with interrupted #4-0 Prolene, and then the  intercostal muscle flap was sutured to the bronchus.  All bleeding was  electrocoagulated.  The chest tube was brought in through the trocar site  and tied in place with #0 silk.  An intercostal nerve block was done in the  usual fashion.  The On-Q catheter was placed in the usual fashion along the  ribs and secured in place with Steri-Strips.  The chest was closed with six  pericostals of #1 Vicryl, with #1 Vicryl in the muscle  layer, #2-0 Vicryl in  the subcutaneous tissue and Ethicon skin clips.   The patient returned to the recovery room in serious condition.           ______________________________  Ines Bloomer, M.D.     DPB/MEDQ  D:  02/21/2005  T:  02/21/2005  Job:  [id number]   cc:   Lajuana Matte, MD  Fax: [phone]

## 2010-07-05 NOTE — Assessment & Plan Note (Signed)
[facility]                            CARDIOLOGY OFFICE NOTE   NAME:[name] [name], [name] [name]                        MRN:          [id number]  DATE:03/12/2006                            DOB:          1934/07/19    PRIMARY CARDIOLOGIST:  Dr. Rollene Rotunda.   PRIMARY CARE PHYSICIAN:  Dr. Marga Melnick.   HISTORY OF PRESENT ILLNESS:  [name] [name] is a 76 year old male patient  with no previously known coronary artery disease who was recently  admitted to [facility] on January 16 with complaints of chest  pain.  He ruled out for myocardial infarction and was discharged home.  He underwent stress Myoview testing on March 09, 2006.  The patient  developed ventricular ectopy including a couple of fusion beats with  exertion, as well as nonsustained ventricular tachycardia.  His nuclear  images revealed a very mild inferior ischemia.  He was brought back into  the office today to discuss the results of his test.  He denies any  current chest pain or shortness of breath.  Denies any syncope or near-  syncope.  Denies any orthopnea or paroxysmal nocturnal dyspnea.  Denies  any lower extremity edema.  Denies any palpitations.   CURRENT MEDICATIONS:  1. Aspirin 81 mg daily.  2. Hydrochlorothiazide 25 mg 1/2 tablet daily.  3. Ranitidine 300 mg daily.  4. Pravachol 40 mg daily.   ALLERGIES:  CIPRO causes a rash.   SOCIAL HISTORY:  Denies any current tobacco abuse.  He quit smoking in  1995.   FAMILY HISTORY:  Significant for coronary artery disease.   REVIEW OF SYSTEMS:  Please see HPI.  Denies any fevers or chills, cough,  melena, hematochezia, hematuria, dysuria.  The rest of the review of  systems are negative.   PHYSICAL EXAM:  He is a well-developed, well-nourished male in no  distress.  Blood pressure 140/90, pulse 66, weight 167 pounds.  HEENT:  Unremarkable.  NECK:  Without JVD.  CARDIAC:  S1, S2.  Regular rate and rhythm without murmurs,  clicks,  rubs, or gallops.  LUNGS:  Clear to auscultation bilaterally without wheeze, rales, or  rhonchi.  ABDOMEN:  Soft and nontender with normoactive bowel sounds.  No  organomegaly.  EXTREMITIES:  Without edema.  Calves are soft and nontender.  Femoral  artery pulses are 2+ bilaterally without bruits.   ELECTROCARDIOGRAM:  Sinus rhythm with a heart rate of 66.  No acute  changes.   IMPRESSION:  1. Abnormal stress Myoview scan with possible very mild inferior      ischemia and exertional ventricular ectopy, including nonsustained      ventricular tachycardia.  2. Hypertension.  3. Hyperlipidemia.  4. Family history of coronary artery disease.  5. Ex-smoker.  6. History of squamous cell lung carcinoma.      a.     Status post left pneumonectomy.      b.     Status post chemotherapy.  7. Significant bradycardia with beta blockers in the hospital.   PLAN:  The patient recently presented to the hospital with atypical  chest pain.  He has significant risk factors for coronary artery  disease.  His Myoview scan is abnormal as noted above.  At this point in  time, we recommend proceeding with outpatient cardiac catheterization to  better define his anatomy.  Risks and benefits have been discussed with  the patient and he agrees to proceed.  I also discussed this with Dr.  Myrtis Ser today and he agrees with the above assessment and plan.  The  patient will be maintained on aspirin.  I have given him a prescription  for sublingual nitroglycerin to be taken as needed.  He knows to go to  the emergency room should he ever feel that he needs to take  nitroglycerin.  He did have significant bradycardia in the hospital with  low-dose beta blocker.  Therefore, he will not be started on beta  blocker at this point in time.      [name] Newcomer, PA-C  Electronically Signed      [name] Abed, MD, [facility]  Electronically Signed   SW/MedQ  DD: 03/12/2006  DT: 03/12/2006  Job #: [id number]    cc:   [name] [name]. [name] Ren, MD,FACP,FCCP

## 2010-07-05 NOTE — Discharge Summary (Signed)
[name] [name], [name] [name]                 ACCOUNT NO.:  [id number]   MEDICAL RECORD NO.:  [id number]          PATIENT TYPE:  INP   LOCATION:  3313                         FACILITY:  [facility]   PHYSICIAN:  Jerold Coombe, P.A.DATE OF BIRTH:  [birth year]   DATE OF ADMISSION:  02/21/2005  DATE OF DISCHARGE:                                 DISCHARGE SUMMARY   ADMISSION DIAGNOSIS:  Left lung mass clinically stage II-A non-small cell  lung cancer.   DISCHARGE DIAGNOSIS:  1.  Squamous cell carcinoma of the left lung status post pneumonectomy      ([id number]).  2.  Postoperative hypokalemia, improved.  3.  Postoperative premature ventricular contractions.  4.  Hypertension.  5.  History of tobacco use, none since 1985.  6.  Allergy to Cipro which causes a rash.   PROCEDURES:  Left video assisted thoracoscopic surgery, left thoracotomy for  left radical pneumonectomy, fifth rib resection, intercostal muscle flap  closure of bronchial stump, and lymph node dissection by Dr. Jovita Gamma  that was done on February 21, 2005.   BRIEF HISTORY:  [name] [name] is a 76 year old Caucasian male with a history of  tobacco use.  He presented with a cough for several months.  Chest x-ray  revealed a left upper lobe mass.  A CT scan showed a large mass at the left  superior hilum with occlusion of the left upper lobe and pulmonary artery  involvement.  There was a mediastinal and left AP window adenopathy.  Pulmonary function tests showed an FVC of 2.85 with an FEV1 of 2.36.  A PET  scan was obtained which was positive for cancer in the left upper lobe.  He  underwent mediastinoscopy which was negative.  Bronchoscopy was positive for  cancer suggesting non-small cell adenocarcinoma.  He had no hemoptysis,  excessive sputum, fever, chills, or shortness of breath.  He had previous  Cardiolite which was negative.  Since he was clinically a stage III-A, he  was referred for chemotherapy and underwent four cycles of  chemotherapy with  a good response to the cancer.  At that point, Dr. Edwyna Shell felt he was  appropriate for left upper lobectomy versus left pneumonectomy with lymph  node dissection.   HOSPITAL COURSE:  [name] [name] was electively admitted to [facility]  on February 21, 2005, and did ultimately undergo a left pneumonectomy.  Pathology showed moderately differentiated squamous cell carcinoma involving  both the left upper and lower lobes.  There was metastatic carcinoma  involvement for the seven hilar lymph nodes.  Postoperatively, he was  transferred to the surgical intensive care unit and remained there until  February 25, 2005.  While in the intensive care unit, he remained overall  stable.  Chest tubes were discontinued over the next few days.  He did have  some frequent premature atrial contractions which prompted the initiation of  digoxin and beta blocker therapy.  On the evening of postoperative day two,  he did spike a temperature of 101.4 and sputum and urine cultures were  ordered.  It appears no sputum culture  was obtained, but urine culture was  negative.  Ultimately, it was felt his fevers were most likely secondary to  atelectasis as his fever subsided.  White blood counts also remained normal,  although initial reactive leukocytosis peaking at 13.6 as he had also  received a dose of Solu-Medrol postoperatively.  He was treated routinely  with Zinacef postoperatively and was later changed to Maxipime for a total  of six doses.  Additionally, he was also treated successfully with potassium  for mild hypokalemia.  By postoperative day four, [name] [name] was felt stable  to transfer out of the surgical intensive care unit to the step down unit  3300.  At this time, chest tubes are discontinued.  He also had been weaned  from supplemental oxygen.  Chest x-ray remained stable showing small right  effusion and stable air and fluid in the pneumonectomy space.  He had been   maintained in sinus rhythm and blood pressure was also stable ranging about  120 to 140 over 60s.  His temperature was stable between 98 and 99.  He was  tolerating a regular diet and bowel and bladder were functioning  appropriately.  Both physical therapy and occupational therapy were ordered  prior to discharge to assess any mobility issues and make recommendations  for equipment if needed.  At the time of this dictation, evaluation reports  are still pending but home health arrangements will be made if felt  appropriate.  By postoperative day five, January 10, [name] [name] was felt in  near readiness for discharge home, as he remained stable overnight out of  the intensive care unit.  Discharge orders to be written on February 27, 2005, pending no significant change in status and if his follow up labs and  chest x-ray remain stable.   LABORATORY DATA:  At the time of this dictation, his most recent labs show  sodium 133, potassium 3.3 which was supplemented, chloride 99, CO2 28, BUN  11, creatinine 0.9, blood glucose 103, AST 30, ALT 12, alkaline phos 34,  total bilirubin 0.8, albumin 2.6.  Hemoglobin 10.7, hematocrit 30.6, white  blood count 6.8, platelet count 232.  Pneumonectomy pathology showing  moderately differentiated squamous cell carcinoma involving the left upper  and lower lobes with metastatic carcinoma involving 4-7 hilar lymph nodes  (prior MRI of the brain in 2006 showed no metastatic lesions).   DISCHARGE MEDICATIONS:  1.  Tylox 1-2 tablets p.o. q.4h. p.r.n. pain.  2.  Lanoxin 0.125 mg daily.  3.  Lopressor 25 mg p.o. b.i.d.  4.  Multi-vitamin p.o. daily.   DISCHARGE INSTRUCTIONS:  He is to resume a regular diet.  He is to avoid  driving or heavy lifting for the next three weeks.  He should increase his  activity slowly and was encouraged to continue daily walking and breathing  exercises.  He may shower and clean his incision daily with mild soap and water.  The  staples will be removed at his follow up appointment with Dr.  Edwyna Shell.  He should call if he develops fever greater than 101, redness or  drainage from his incision site, new onset or increasing shortness of  breath.  He is to follow up with Dr. Edwyna Shell at the [facility] office on March 05, 2005, at 3:40 p.m.  He is to have a chest x-ray one hour before this  appointment at the [facility].  He is to follow up with Dr.  Arbutus Ped as directed at  [facility] and to schedule follow up  with Dr. Alwyn Ren within the next four weeks to re-evaluate his new medication  regimen in regards to his heart rate and postoperative premature atrial  contractions.      Jerold Coombe, P.A.     AWZ/MEDQ  D:  02/26/2005  T:  02/26/2005  Job:  [id number]   cc:   Lajuana Matte, MD  Fax: [phone]   [name] Dubin. Alwyn Ren, M.D. [facility]  [phone] [place]  [place] [address]

## 2010-07-05 NOTE — Op Note (Signed)
NAMEABDULMALIK, DARCO                 ACCOUNT NO.:  [id number]   MEDICAL RECORD NO.:  [id number]          PATIENT TYPE:  OUT   LOCATION:  XRAY                         FACILITY:  [facility]   PHYSICIAN:  Ines Bloomer, M.D. DATE OF BIRTH:  01-17-1935   DATE OF PROCEDURE:  DATE OF DISCHARGE:  10/18/2004                                 OPERATIVE REPORT   PREOPERATIVE DIAGNOSIS:  Probable stage III non-small-cell lung cancer.   POSTOPERATIVE DIAGNOSIS:  Probable stage III non-small-cell lung cancer.   OPERATION PERFORMED:  Fiberoptic bronchoscopy, mediastinoscopy.   After general anesthesia, the fiberoptic bronchoscope was passed through the  endotracheal tube.  Carina was in the midline.  The right upper lobe, right  middle lobe and right lower lobe orifices were normal.  The left main stem  was normal but there was complete occlusion of the left upper lobe orifices  and there was evidence of cancer in the orifice of the right lower lobe.  Pictures were taken.  There had been a previous biopsy, so we knew  it was  non-small-cell lung cancer.  The video bronchoscope was removed. The  anterior neck was prepped and draped in the usual sterile manner.  Transverse incision was made.  It was carried down with electrocautery  through subcutaneous and fascia.  Pretracheal fascia was entered.  Biopsies  of 4R, 7 and 4L nodes were done.  Strap muscles were closed with 2-0 Vicryl,  subcutaneous tissue with 3-0 Vicryl and Dermabond for the skin.  Patient  tolerated the procedure well and was returned to the recovery room in stable  condition.           ______________________________  Ines Bloomer, M.D.     DPB/MEDQ  D:  10/22/2004  T:  10/22/2004  Job:  [id number]

## 2010-07-05 NOTE — Discharge Summary (Signed)
[name] [name], STEINER                 ACCOUNT NO.:  [id number]   MEDICAL RECORD NO.:  [id number]          PATIENT TYPE:  OBV   LOCATION:  3713                         FACILITY:  [facility]   PHYSICIAN:  Madolyn Frieze. Jens Som, MD, FACCDATE OF BIRTH:  February 03, 1935   DATE OF ADMISSION:  03/04/2006  DATE OF DISCHARGE:  03/05/2006                               DISCHARGE SUMMARY   PROCEDURES:  None.   PRIMARY DIAGNOSIS:  Chest pain, enzymes negative for MI and outpatient  schedule possible, possible reflux.   SECONDARY DIAGNOSES:  1. History of squamous lung cancer, status post left thoracotomy with      node dissection February 21, 2006, with follow-up chemotherapy.  2. Remote history of tobacco use, quit 1995.  3. Hypertension  4. Hyperlipidemia with a total cholesterol 230, triglycerides 54, HDL      83, LDL 136.  5. Allergy or intolerance to Cipro with a rash.  6. Family history of coronary artery disease.  7. History of PVCs.   TIME OF DISCHARGE:  Thirty eight minutes.   HOSPITAL COURSE:  Mr. Tippett is a 76 year old male with no previous  history of coronary artery disease.  While driving, he developed sudden  onset of diaphoresis and chest tightness that lasted about 30 minutes.  He saw Dr. Drue Novel who sent him to the emergency room for admission.   Mr. Parkison was admitted overnight.  His cardiac enzymes were within  normal limits.  The lipid profile was described above.  His initial  blood pressure was elevated at greater than 200 systolic.  He was  started on a beta blocker and a diuretic and at discharge systolic blood  pressure was 137/84.  He became bradycardic and only one dose of the  metoprolol was given, so this medication was discontinued.  He is to  continue on a low-dose diuretic and follow as an outpatient.   A lipid profile was performed with results described above.  Dr.  Jens Som initially recommended Zocor 40 mg a day, but the patient stated  he could only afford [facility] $4  medications, and so this was changed to  Pravachol 40 mg a day after [employer] was contacted and advised that they  did not have Zocor or simvastatin on their $4 list.  He was on a proton  pump inhibitor, but this was changed to ranitidine 300 mg a day for the  same reason.  Of note, after receiving an initial dose of metoprolol 25  mg, he had a 3 second pause, but was asymptomatic.  Dr. Jens Som  evaluated Mr. Wootan on March 05, 2006, and considered him stable for  discharge with outpatient follow-up arranged.   DISCHARGE INSTRUCTIONS:  1. His activity level is to be increased gradually.  He is to stick to      a low-fat diet.  He is to follow up in the office with an      outpatient Myoview on January 21, and he is to follow up with Dr.      Antoine Poche on February 3.   DISCHARGE MEDICATIONS:  1. Aspirin 81  mg a day.  2. Ranitidine 300 mg daily.  3. HCTZ 25 mg 1/2 tablet q. Day.  4. Pravachol 40 mg daily.      Theodore Demark, PA-C      Madolyn Frieze. Jens Som, MD, [facility]  Electronically Signed    RB/MEDQ  D:  03/05/2006  T:  03/05/2006  Job:  [id number]   cc:   [name] Dubin. Alwyn Ren, MD,FACP,FCCP

## 2010-07-05 NOTE — Cardiovascular Report (Signed)
NAME:  [name] [name], [name] [name]                 ACCOUNT NO.:  [id number]   MEDICAL RECORD NO.:  [id number]          PATIENT TYPE:  OIB   LOCATION:  1963                         FACILITY:  [facility]   PHYSICIAN:  Bruce R. Juanda Chance, MD, FACCDATE OF BIRTH:  03-22-1934   DATE OF PROCEDURE:  03/13/2006  DATE OF DISCHARGE:                            CARDIAC CATHETERIZATION   PRIMARY CARE PHYSICIAN:  Dr. Lona Kettle.   CLINICAL HISTORY:  [name] is 73 years and has no prior history of  heart disease.  He was hospitalized on January 16 with chest pain and  ruled out for myocardial infarction.  He was discharged and had an  outpatient Cardiolite scan which suggested inferior ischemia.  He was  seen by Dr. Myrtis Ser and Tereso Newcomer and scheduled for catheterization  today.  His past history is significant for a squamous cell cancer of  the left lung treated with pneumonectomy and chemotherapy.  He also has  hypertension and hyperlipidemia.   PROCEDURE:  The procedure was performed via femoral artery using  arterial sheath and 4-French preformed coronary catheters.  A  ______________ anterior wall puncture was performed, and Omnipaque  contrast was used.  We did not do a left ventriculogram because his  creatinine was 1.4, but we did do a distal aortogram because of his  elevated creatinine and hypertension looking for renal artery stenosis.   The patient tolerated the procedure well and left the laboratory in  satisfactory condition.   RESULTS:  The aortic pressure was 159/74 with a mean of 107.  The left  ventricle pressure was 159/12.   Left main coronary artery was free of significant disease.   Left anterior descending artery gave rise to a large septal perforator  and a very large diagonal branch which was about the size of the LAD.  The LAD itself had a 50% narrowing in its mid portion.  The rest of the  vessels were free of significant disease.   The circumflex artery gave rise to a ramus branch  and a large  posterolateral branch.  These vessels were free of significant disease.   The right coronary artery was a moderate-sized vessel that gave rise to  a conus branch, a right ventricular branch, a posterior descending  branch and a posterolateral branch.  There was an 80-90% stenosis in the  mid portion of the right coronary artery with some hypodensity and  slight calcium.  The rest of the vessel was free of major obstruction.   No left ventriculogram was performed.   A distal aortogram was performed which showed patent renal arteries and  no significant aortoiliac obstruction.   CONCLUSION:  Coronary artery disease with 50% narrowing in the mid LAD  and 80-90% stenosis in the mid-right coronary artery with normal LV  function by noninvasive studies.   RECOMMENDATIONS:  Will plan PCI of the right coronary artery next week.      Bruce Elvera Lennox Juanda Chance, MD, [facility]  Electronically Signed     BRB/MEDQ  D:  03/13/2006  T:  03/13/2006  Job:  [id number]   cc:  [name] Dubin. Alwyn Ren, MD,FACP,FCCP  Rollene Rotunda, MD, [facility]

## 2010-07-05 NOTE — Assessment & Plan Note (Signed)
[facility]                            CARDIOLOGY OFFICE NOTE   NAME:[name] [name], [name] [name]                          MRN:          [id number]  DATE:  03/30/2006                              DOB:    PRIMARY CARE PHYSICIAN:  [name] Dubin. Alwyn Ren, M.D.   REASON FOR PRESENTATION:  Patient with coronary disease, status post  stenting.   HISTORY OF PRESENT ILLNESS:  The patient returns for follow-up, status  post stenting of a right coronary lesion.  He had chest discomfort but  ruled out for myocardial infarction in late January.  He was discharged  for a stress perfusion study which suggested some odd inferior ischemia.  He subsequently had a catheterization which demonstrated left main  normal, the LAD had 50% narrowing in the midsection, the circumflex was  free of significant disease, the right coronary artery had an 80-90'%  stenosis in the midsection.  The patient subsequently had a bare metal  stent, reducing the 90% mid lesion to 0.  His EF was well-reserved.   Since discharge he has had none of the arm discomfort that he had at the  time of presentation.  He was also burping and passing quite a bit of  gas at that time.  He relates this to his heart disease, though it  sounded very atypical.  He is no longer doing as much of that, though he  is doing a little bit.  He has been starting a walking regimen and  cannot bring on any symptoms.   PAST MEDICAL HISTORY:  1. Coronary artery disease as described.  2. Hypertension.  3. Hyperlipidemia.  4. Squamous cell lung cancer, status post pneumonectomy and      chemotherapy in 2006.  5. Bradycardia on beta blockers.   ALLERGIES:  None.   CURRENT MEDICATIONS:  1. Aspirin 81 mg daily.  2. Hydrochlorothiazide 12.5 mg daily.  3. Ranitidine 300 mg daily.  4. Pravachol 40 mg daily.  5. Plavix 75 mg daily  6. Aspirin 325 mg daily.   REVIEW OF SYSTEMS:  As stated in the HPI, otherwise negative for other  systems.   PHYSICAL EXAMINATION:  GENERAL:  The patient is in no distress.  VITAL SIGNS:  Blood pressure 182/108, heart rate 80 and regular, weight  168 pounds, body mass index 26.  HEENT:  Eyes unremarkable.  Pupils equal, round, and reactive to light.  Fundi not visualized.  Oral mucosa unremarkable.  NECK:  No jugular venous distention to 45 degrees, carotid upstroke  brisk and symmetric.  No bruits, no thyromegaly.  LYMPHATICS:  No cervical, axillary, or inguinal adenopathy.  LUNGS:  Clear to auscultation bilaterally.  BACK:  No costovertebral angle tenderness.  CHEST:  Unremarkable.  HEART:  PMI not displaced or sustained.  S1 and S2 within normal limits.  No S3, no S4, no clicks, no rubs, no murmurs.  ABDOMEN:  Flat.  Positive bowel sounds.  Normal in frequency and pitch.  No bruits, no rebound, no guarding, no midline pulsatile masses.  No  hepatomegaly, no splenomegaly.  SKIN:  No rashes, no nodules.  EXTREMITIES:  2+ pulses throughout.  No edema, no cyanosis, no clubbing.  NEUROLOGIC:  Oriented to person, place, and time.  Cranial nerves II-XII  grossly intact.  Motor grossly intact.   LABORATORY DATA:  EKG:  Sinus rhythm, rate 72, axis within normal  limits.  Intervals within normal limits.  No acute ST/T-wave changes.   ASSESSMENT/PLAN:  1. Coronary disease.  The patient is having no further cardiovascular      symptoms.  No further cardiovascular testing is suggested.  He will      continue with aggressive secondary risk reduction.  He can stop his      Plavix after one month with the bare metal stent.  2. Hypertension.  Blood pressure is elevated today.  He is going to      keep an eye on this.  The next step could be to increase      hydrochlorothiazide.  After that I would use an ACE inhibitor.  He      did not tolerate beta blockers.  3. Follow-up.  Will see him back in six months or sooner if needed.     Rollene Rotunda, MD, [facility]  Electronically Signed     JH/MedQ  DD: 03/30/2006  DT: 03/30/2006  Job #: [id number]   cc:   [name] Dubin. Alwyn Ren, MD,FACP,FCCP

## 2010-07-05 NOTE — Op Note (Signed)
NAME:  [name] [name], [name] [name]                 ACCOUNT NO.:  [id number]   MEDICAL RECORD NO.:  [id number]          PATIENT TYPE:  AMB   LOCATION:  CARD                         FACILITY:  [facility]   PHYSICIAN:  Casimiro Needle B. Sherene Sires, M.D. [facility] OF BIRTH:  07-18-1934   DATE OF PROCEDURE:  09/26/2004  DATE OF DISCHARGE:                                 OPERATIVE REPORT   PROCEDURE:  Fiberoptic bronchoscopy with Wang biopsy and endobronchial  biopsy of the right upper lobe orifice.   REFERRING PHYSICIAN:  [name] Dubin. Alwyn Ren, M.D. [facility]   INDICATION:  Please see attached pulmonary consultation note from the office  records.  This patient agreed to the procedure after full discussion of  risks, benefits, and alternatives.   MEDICATIONS:  He was premedicated with a total of 5 mg IV Versed and 25 mg  IV Demerol with adequate sedation and cough suppression.  He was also  pretreated with 1% lidocaine by updraft nebulizer.  The right naris was  prepared with 2% lidocaine jelly.   DESCRIPTION OF PROCEDURE:  Using a standard flexible video bronchoscope, the  right naris was easily cannulated with good visualization into the  oropharynx and larynx.  The cords moved normally and there were no apparent  upper airway lesions.   Using additional 1% lidocaine, the entire tracheobronchial tree was explored  bilaterally with the following findings:  1.  Trachea, carina, and all the right-sided airways were normal.  2.  The left main stem bronchus was normal.  3.  At the very tip of the left main stem bronchus, there was an obvious      mass at the level of the __________ between the left upper lobe and left      lower lobe partially obstructing the left lower lobe but completely      obstructing the left upper lobe in an asymmetric fashion.  This appeared      erythematous and hypervascular with a vegetative surface extending into      the left upper lobe consistent with bronchogenic carcinoma.   Initially the mass was  approached with a Wang biopsy with the needle  introduced into the _________ between the left upper lobe and left lower  lobe x2.   Additionally, the mass was biopsied at the level of the left upper lobe  orifice x4 with adequate tissue obtained by standard endobronchial forceps  technique and minimal bleeding was encountered.   The patient tolerated the procedure well.   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  This patient would  appear to be a candidate for pneumonectomy based on endobronchial anatomy.  Will need to evaluate the studies that are pending (which include bronchial  washings for cytology) and then set him up for a PET scan and PFTs to see if  he is a candidate for pneumonectomy.       MBW/MEDQ  D:  09/26/2004  T:  09/26/2004  Job:  [id number]   cc:   Tedd Sias  [address]  [place]  [place] [address]  Fax: [phone]

## 2010-07-05 NOTE — H&P (Signed)
NAME:  [name] [name], [name] [name]                 ACCOUNT NO.:  [id number]   MEDICAL RECORD NO.:  [id number]          PATIENT TYPE:  INP   LOCATION:  NA                           FACILITY:  [facility]   PHYSICIAN:  Ines Bloomer, M.D. DATE OF BIRTH:  Oct 09, 1934   DATE OF ADMISSION:  02/21/2005  DATE OF DISCHARGE:                                HISTORY & PHYSICAL   CHIEF COMPLAINT:  Left lung mass.   HISTORY OF PRESENT ILLNESS:  This 76 year old Caucasian male is a long-time  smoker.  He started having a cough for several months.  The chest x-ray  revealed a left upper lobe mass.  A CT scan showed a large mass in the left  superior hilum with occlusion of the left upper lobe and pulmonary artery  involvement.  There was some mediastinal and left AP window adenopathy.  Pulmonary function tests showed a FVC of 2.85 with an FEV-1 of 2.36.  He  quit smoking in 1985.  He is presently retired.  PET scan was obtained which  was positive for the cancer in the left upper lobe.  He underwent  mediastinoscopy which was negative. Bronchoscopy was positive for cancer.  He had a nonsmall cell cancer adenocarcinoma.  He has had no hemoptysis,  excessive sputum, fevers, chills or shortness of breath.  He had a previous  Cardiolite that was negative in the past.  Because he was clinically a stage  IIIA he was referred for chemotherapy and underwent four cycles of  chemotherapy with good response to the cancer and is now being re-admitted  for resection of this lesion which would be either left upper lobectomy or  possible left pneumonectomy with node dissection.   PAST MEDICAL HISTORY:  Significant for hypertension.  He had previously been  on lisinopril but is not presently on lisinopril and no medications.  He is  allergic to CIPRO which causes a rash.   FAMILY HISTORY:  Positive.  His father had a myocardial infarction and there  is also cardiac disease in his brother and hypertension in the mother as  well as  hyperlipidemia in another brother.  No history of cancer.   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  He is married and does not drink alcohol on a regular basis.   REVIEW OF SYSTEMS:  He is 168 pounds.  He is 5 feet 6 inches.  His weight  has been stable.  He has got no cardiac, no angina, or atrial fibrillation.  Pulmonary is in history of present illness.  GI:  No nausea, vomiting,  hiatal hernia or dysphagia.  GU:  No dysuria or frequent urination.  VASCULAR:  No TIA's, DVT.  NEUROLOGICAL:  No history of headaches, black  outs or seizures.  ORTHOPEDICS:  He has no arthritis.  No psychiatric  illnesses.  EYE/ENT:  No changes in eye sight or hearing.  HEMATOLOGICAL:  No problems with anemia.   PHYSICAL EXAMINATION:  GENERAL:  He is a well developed Caucasian male in no  acute  distress.  VITAL SIGNS:  Blood pressure is 138/80, pulse 66, respirations 18,  saturations 97%.  HEAD:  Atraumatic.  EYES:  Pupils equal, reactive to light and accommodation.  Extraocular  movements are normal.  EARS:  Tympanic membranes are intact.  NOSE:  There is no septal deviation.  MOUTH:  Without lesion.  NECK:  Supple with no supraclavicular or axillary adenopathy.  CHEST:  Clear to auscultation and percussion.  HEART:  Regular sinus rhythm.  No murmurs.  ABDOMEN:  Soft.  There is no hepatosplenomegaly.  Bowel sounds are normal.  EXTREMITIES:  Pulses are 2+. There is no clubbing or edema.  NEUROLOGICAL:  He is oriented x 3.  Cranial nerves II-XII are intact.  Sensory and motor are intact.  SKIN:  Without lesion.   IMPRESSION:  Clinically stage IIA nonsmall cell lung cancer.  History of  hypertension in the past.   PLAN:  Possible left upper lobectomy.  Possible left pneumonectomy.           ______________________________  Ines Bloomer, M.D.     DPB/MEDQ  D:  02/19/2005  T:  02/19/2005  Job:  [id number]

## 2010-07-05 NOTE — H&P (Signed)
[name] [name], [name] [name]                 ACCOUNT NO.:  [id number]   MEDICAL RECORD NO.:  [id number]          PATIENT TYPE:  OBV   LOCATION:  3713                         FACILITY:  [facility]   PHYSICIAN:  Rollene Rotunda, MD, FACCDATE OF BIRTH:  04-Sep-1934   DATE OF ADMISSION:  03/04/2006  DATE OF DISCHARGE:                              HISTORY & PHYSICAL   PRIMARY CARE PHYSICIAN:  Dr. Alwyn Ren.   PRIMARY CARDIOLOGIST:  [facility], being seen by Dr. Antoine Poche.   PATIENT PROFILE:  [age]-year-old Caucasian male with no prior  history of coronary artery disease, who presents with atypical chest  pain.   PROBLEMS:  1. Atypical chest pain.      a.     August 2002 Myoview:  No ischemia.  Ejection fraction 63%.  2. Stage II non-small-cell/squamous cell left lung carcinoma.      a.     Status post left pneumonectomy, February 21, 2005.      b.     Status post chemotherapy, finishing in June or July 2007;       the patient is not sure.  3. Remote tobacco abuse, approximately a 45-pack-year history,      quitting in 1995.  4. Untreated hypertension.  5. Untreated hyperlipidemia.   HISTORY OF PRESENT ILLNESS:  [age]-year-old Caucasian male without  prior history of CAD.  He was in his usual state of health until  approximately 11:30 a.m. this morning when he was driving and developed  sudden onset of diaphoresis with 2-3/10 substernal chest tightness and  mild shortness of breath lasting approximately 30 minutes and resolving  spontaneously.  He also had associated belching, which persisted for  about another hour until about 1 p.m..  He saw Dr. Drue Novel in the office and  ECG was performed and showed no acute changes; however, given his  symptoms with history of hypertension, hyperlipidemia, and tobacco  abuse, decision was made to send him to the [facility] ED for  evaluation..  Here, he currently denies any chest pain or shortness of  breath, but says he is still intermittently  belching as well as passing  gas.  He feels as though his abdomen has been bloated for the past 3 or  4 days and his last bowel movement was about 2 days ago.  He denies any  PND, orthopnea, nausea, vomiting, dizziness, syncope or edema.   ALLERGIES:  CIPRO causes a rash.   CURRENT MEDICATIONS:  None.   FAMILY HISTORY:  Mother died of CAD/MI in her 78s.  Father died of an MI  in his 79s.  He has a brother with hypertension and another brother with  hyperlipidemia.   SOCIAL HISTORY:  He lives in [place] by himself.  He is recently widowed.  He has 3 grown daughters.  He is a retired Music therapist.  He drinks a 6-  pack of beer a day.  He denies any drug use and smoked about a pack and  a half a day for 30 years, quitting in 1995.   REVIEW OF SYSTEMS:  Positive for chest pain and mild  shortness of  breath, as well as diaphoresis as outlined in the HPI.  All other  systems reviewed negative.   PHYSICAL EXAM:  VITAL SIGNS:  Temperature 97.3, heart rate 75,  respirations 18, blood pressure 210/91 and then 163/89 on repeat.  Pulse  oximetry 96% on room air.  GENERAL:  A pleasant white male in no acute distress, awake, alert and  oriented x3.  NECK:  Normal carotid upstrokes.  No bruits or JVD.  LUNGS:  Respirations regular and unlabored with no breath sounds on the  left and clear to auscultation on the right.  CARDIAC:  Regular S1 and S2.  No S3, S4 or murmurs.  ABDOMEN:  Round, soft, nontender and non-distended.  Bowel sounds  present x4.  EXTREMITIES:  Warm, dry and pink.  No clubbing, cyanosis or edema.  Dorsalis pedis and posterior tibial pulses 2+ and equal bilaterally.  No  femoral bruits are noted.   RADIOLOGICAL AND ACCESSORY CLINICAL DATA:  Chest x-ray is pending.   EKG shows sinus rhythm with a normal axis and a rate of 67 beats per  minute.  There are no acute ST-T changes.   LABORATORY WORK:  Hemoglobin 17.0, hematocrit 50.  Sodium 135, potassium  4.1, chloride 106, CO2  25.5, BUN 12, creatinine 1.3, glucose 91.  WBC  9.9, platelets 308,000.  CK-MB 1.0, troponin-I less than 0.05.   ASSESSMENT AND PLAN:  1. Chest pain, now resolved.  Concerning for angina, as he had chest      pain, shortness of breath and diaphoresis that resolved      spontaneously.  He continues to belch with some abdominal bloating      for the past 3-4 days.  Question cardiac versus gastrointestinal      etiology.  Plan to admit and rule out.  We will add proton pump      inhibitor and will provide him with a GI cocktail here in the      emergency department.  As blood pressure is elevated, we will add      beta blocker and hydrochlorothiazide.  If cardiac enzymes are      negative, we will plan an outpatient Myoview and I have already      scheduled this for Monday, March 09, 2006 at 9:45 a.m.; this was      the first available in the office.  If for some reason the decision      is to perform an inpatient Myoview prior to discharge in the a.m.,      I will hold his beta blocker in the morning.  2. Hypertension.  Blood pressure was markedly elevated on arrival to      the emergency department and is now down some without treatment.      He is not sure if he had a prior history of hypertension.  We will      plan to add a beta blocker and hydrochlorothiazide.  His creatinine      is normal.  3. Hyperlipidemia.  He says he was previously on a statin, but this      fell off of his medication list following his lung surgery.  We      will check lipids and LFTs and add statin if necessary.      Nicolasa Ducking, ANP      Rollene Rotunda, MD, [facility]  Electronically Signed    CB/MEDQ  D:  03/04/2006  T:  03/05/2006  Job:  [id number]

## 2010-07-09 ENCOUNTER — Encounter: Payer: Self-pay | Admitting: *Deleted

## 2010-07-19 ENCOUNTER — Encounter: Payer: Self-pay | Admitting: Cardiology

## 2010-07-19 ENCOUNTER — Ambulatory Visit (INDEPENDENT_AMBULATORY_CARE_PROVIDER_SITE_OTHER): Payer: Medicare Other | Admitting: Cardiology

## 2010-07-19 VITALS — BP 148/86 | HR 62 | Resp 18 | Ht 67.0 in | Wt 172.0 lb

## 2010-07-19 DIAGNOSIS — I10 Essential (primary) hypertension: Secondary | ICD-10-CM

## 2010-07-19 DIAGNOSIS — I2510 Atherosclerotic heart disease of native coronary artery without angina pectoris: Secondary | ICD-10-CM

## 2010-07-19 DIAGNOSIS — E785 Hyperlipidemia, unspecified: Secondary | ICD-10-CM

## 2010-07-19 MED ORDER — LISINOPRIL 40 MG PO TABS: 40.0000 mg | ORAL_TABLET | Freq: Every day | ORAL | Status: DC

## 2010-07-19 MED ORDER — PRAVASTATIN SODIUM 80 MG PO TABS: 80.0000 mg | ORAL_TABLET | Freq: Every day | ORAL | Status: DC

## 2010-07-19 NOTE — Patient Instructions (Addendum)
You are being scheduled for a treadmill.  Please follow the instructions given. Continue your current medications. Please have a fasting lipid panel the day you come back for your treadmill.

## 2010-07-19 NOTE — Assessment & Plan Note (Signed)
The blood pressure is at target. No change in medications is indicated. We will continue with therapeutic lifestyle changes (TLC).  

## 2010-07-19 NOTE — Assessment & Plan Note (Signed)
He will have a lipid profile when he returns.

## 2010-07-19 NOTE — Progress Notes (Signed)
HPI The patient presents for one year followup.  Since I last saw him he has had no new cardiovascular complaints. He doesn't exercise routinely though he stays active doing some light chores around his house. He denies any chest pressure, neck or arm discomfort. He denies any palpitations, presyncope or syncope. He has no shortness of breath, PND or orthopnea. He has had no weight gain or edema.  No Known Allergies  Current Outpatient Prescriptions  Medication Sig Dispense Refill  . aspirin 325 MG tablet Take 325 mg by mouth daily.        . hydrochlorothiazide 25 MG tablet Take 25 mg by mouth daily.        Marland Kitchen KLOR-CON 20 MEQ packet MIX AND DRINK 1 PACKET ONCE DAILY  90 packet  0  . lisinopril (PRINIVIL,ZESTRIL) 40 MG tablet Take 40 mg by mouth daily.        . pravastatin (PRAVACHOL) 80 MG tablet Take 80 mg by mouth daily.        . ranitidine (ZANTAC) 300 MG capsule Take 300 mg by mouth every evening.        Marland Kitchen DISCONTD: potassium chloride SA (K-DUR,KLOR-CON) 20 MEQ tablet Take 20 mEq by mouth daily.        Marland Kitchen DISCONTD: doxepin (SINEQUAN) 10 MG capsule Take 10 mg by mouth as needed.          Past Medical History  Diagnosis Date  . ABDOMINAL PAIN, PERIUMBILICAL   . BRADYCARDIA   . CAD   . DYSLIPIDEMIA   . HYPERTENSION   . POSTSURG PERCUT TRANSLUMINAL COR ANGPLSTY STS   . SLEEP DISORDER   . Squamous cell lung cancer     Past Surgical History  Procedure Date  . Pneumonectomy     chemotherapy in 2006    ROS:  As stated in the HPI and negative for all other systems.  PHYSICAL EXAM BP 148/86  Pulse 62  Resp 18  Ht 5\' 7"  (1.702 m)  Wt 172 lb (78.019 kg)  BMI 26.94 kg/m2 GENERAL:  Well appearing HEENT:  Pupils equal round and reactive, fundi not visualized, oral mucosa unremarkable NECK:  No jugular venous distention, waveform within normal limits, carotid upstroke brisk and symmetric, no bruits, no thyromegaly LYMPHATICS:  No cervical, inguinal adenopathy LUNGS:  Clear to  auscultation bilaterally BACK:  No CVA tenderness CHEST:  Unremarkable HEART:  PMI not displaced or sustained,S1 and S2 within normal limits, no S3, no S4, no clicks, no rubs, no murmurs ABD:  Flat, positive bowel sounds normal in frequency in pitch, no bruits, no rebound, no guarding, no midline pulsatile mass, no hepatomegaly, no splenomegaly EXT:  2 plus pulses throughout, no edema, no cyanosis no clubbing SKIN:  No rashes no nodules NEURO:  Cranial nerves II through XII grossly intact, motor grossly intact throughout PSYCH:  Cognitively intact, oriented to person place and time   EKG:  Sinus rhythm, rate 62, axis within normal limits, intervals within normal limits, no acute ST-T wave changes.   ASSESSMENT AND PLAN

## 2010-07-19 NOTE — Assessment & Plan Note (Signed)
I would like him to exercise more.  It has been 4 years since his stent.  I will bring the patient back for a POET (Plain Old Exercise Test). This will allow me to screen for obstructive coronary disease, risk stratify and very importantly provide a prescription for exercise.

## 2010-09-10 ENCOUNTER — Ambulatory Visit (INDEPENDENT_AMBULATORY_CARE_PROVIDER_SITE_OTHER): Payer: Medicare Other | Admitting: Cardiology

## 2010-09-10 ENCOUNTER — Encounter: Payer: BC Managed Care – PPO | Admitting: Cardiology

## 2010-09-10 ENCOUNTER — Other Ambulatory Visit (INDEPENDENT_AMBULATORY_CARE_PROVIDER_SITE_OTHER): Payer: Medicare Other | Admitting: *Deleted

## 2010-09-10 DIAGNOSIS — E785 Hyperlipidemia, unspecified: Secondary | ICD-10-CM

## 2010-09-10 DIAGNOSIS — I2510 Atherosclerotic heart disease of native coronary artery without angina pectoris: Secondary | ICD-10-CM

## 2010-09-10 LAB — LIPID PANEL
Cholesterol: 194 mg/dL (ref 0–200)
HDL: 102 mg/dL (ref >39.00)
LDL Cholesterol: 79 mg/dL (ref 0–99)
Total CHOL/HDL Ratio: 2
Triglycerides: 63 mg/dL (ref 0.0–149.0)
VLDL: 12.6 mg/dL (ref 0.0–40.0)

## 2010-09-10 NOTE — Progress Notes (Signed)
Exercise Treadmill Test  Pre-Exercise Testing Evaluation Rhythm: normal sinus  Rate: 72   PR:  .13 QRS:  .08  QT:  .34 QTc: .43     Test  Exercise Tolerance Test Ordering MD: Angelina Sheriff, MD  Interpreting MD:  Angelina Sheriff, MD  Unique Test No: 1  Treadmill:  1  Indication for ETT: CAD  Contraindication to ETT: No   Stress Modality: exercise - treadmill  Cardiac Imaging Performed: non   Protocol: standard Bruce - maximal  Max BP:  177/96  Max MPHR (bpm):  146 85% MPR (bpm):  124  MPHR obtained (bpm):  142 % MPHR obtained:  96  Reached 85% MPHR (min:sec):  2:52 Total Exercise Time (min-sec):  5:00  Workload in METS:  7.0 Borg Scale: 19  Reason ETT Terminated:  desired heart rate attained    ST Segment Analysis At Rest: normal ST segments - no evidence of significant ST depression With Exercise: no evidence of significant ST depression  Other Information Arrhythmia:  No Angina during ETT:  absent (0) Quality of ETT:  diagnostic  ETT Interpretation:  normal - no evidence of ischemia by ST analysis  Comments: The patient had an moderate exercise tolerance.  There was no chest pain.  There was an appropriate level of dyspnea.  There were no arrhythmias, a normal heart rate response and normal BP response.  There were no ischemic ST T wave changes.  He did not have a normal heart rate recovery.  Recommendations: Negative adequate ETT.  No further testing is indicated.  Based on the above I gave the patient a prescription for exercise.

## 2010-09-16 ENCOUNTER — Encounter: Payer: Self-pay | Admitting: *Deleted

## 2010-09-23 ENCOUNTER — Other Ambulatory Visit: Payer: Self-pay | Admitting: Cardiology

## 2010-10-07 ENCOUNTER — Other Ambulatory Visit: Payer: Self-pay | Admitting: Internal Medicine

## 2010-10-07 ENCOUNTER — Encounter (HOSPITAL_COMMUNITY): Payer: Self-pay

## 2010-10-07 ENCOUNTER — Encounter (HOSPITAL_BASED_OUTPATIENT_CLINIC_OR_DEPARTMENT_OTHER): Payer: Medicare Other | Admitting: Internal Medicine

## 2010-10-07 ENCOUNTER — Ambulatory Visit (HOSPITAL_COMMUNITY)
Admission: RE | Admit: 2010-10-07 | Discharge: 2010-10-07 | Disposition: A | Discharge status: Home / Self Care | Payer: Medicare Other | Source: Ambulatory Visit | Attending: Internal Medicine | Admitting: Internal Medicine

## 2010-10-07 DIAGNOSIS — I2510 Atherosclerotic heart disease of native coronary artery without angina pectoris: Secondary | ICD-10-CM | POA: Insufficient documentation

## 2010-10-07 DIAGNOSIS — C3490 Malignant neoplasm of unspecified part of unspecified bronchus or lung: Secondary | ICD-10-CM

## 2010-10-07 DIAGNOSIS — C3410 Malignant neoplasm of upper lobe, unspecified bronchus or lung: Secondary | ICD-10-CM

## 2010-10-07 DIAGNOSIS — I700 Atherosclerosis of aorta: Secondary | ICD-10-CM | POA: Insufficient documentation

## 2010-10-07 LAB — CMP (CANCER CENTER ONLY)
AST: 25 U/L (ref 11–38)
Albumin: 3.6 g/dL (ref 3.3–5.5)
Alkaline Phosphatase: 47 U/L (ref 26–84)
BUN, Bld: 14 mg/dL (ref 7–22)
Creat: 1.2 mg/dl (ref 0.6–1.2)
Potassium: 4.6 mEq/L (ref 3.3–4.7)

## 2010-10-07 LAB — CBC WITH DIFFERENTIAL/PLATELET
Basophils Absolute: 0 10*3/uL (ref 0.0–0.1)
EOS%: 2.6 % (ref 0.0–7.0)
HGB: 14.3 g/dL (ref 13.0–17.1)
MCH: 32.9 pg (ref 27.2–33.4)
MCV: 95.1 fL (ref 79.3–98.0)
MONO%: 14.4 % — ABNORMAL HIGH (ref 0.0–14.0)
RDW: 13.6 % (ref 11.0–14.6)

## 2010-10-07 MED ORDER — IOHEXOL 300 MG/ML  SOLN
80.0000 mL | Freq: Once | INTRAMUSCULAR | Status: AC | PRN
  Administered 2010-10-07: 80 mL via INTRAVENOUS

## 2010-10-09 ENCOUNTER — Encounter (HOSPITAL_BASED_OUTPATIENT_CLINIC_OR_DEPARTMENT_OTHER): Payer: Medicare Other | Admitting: Internal Medicine

## 2010-10-09 DIAGNOSIS — C3410 Malignant neoplasm of upper lobe, unspecified bronchus or lung: Secondary | ICD-10-CM

## 2011-02-03 ENCOUNTER — Other Ambulatory Visit: Payer: Self-pay | Admitting: Cardiology

## 2011-02-18 HISTORY — PX: CATARACT EXTRACTION, BILATERAL: SHX1313

## 2011-03-28 ENCOUNTER — Other Ambulatory Visit: Payer: Self-pay | Admitting: Internal Medicine

## 2011-03-28 DIAGNOSIS — C3490 Malignant neoplasm of unspecified part of unspecified bronchus or lung: Secondary | ICD-10-CM

## 2011-03-29 ENCOUNTER — Telehealth: Payer: Self-pay | Admitting: Internal Medicine

## 2011-03-29 NOTE — Telephone Encounter (Signed)
S/w the pt's dtr regarding the pt's aug 2013 appts

## 2011-05-11 ENCOUNTER — Other Ambulatory Visit: Payer: Self-pay | Admitting: Cardiology

## 2011-05-12 NOTE — Telephone Encounter (Signed)
..   Requested Prescriptions   Pending Prescriptions Disp Refills  . hydrochlorothiazide (HYDRODIURIL) 25 MG tablet [Pharmacy Med Name: HYDROCHLOROTHIAZIDE TAB 25MG ] 90 tablet 0    Sig: TAKE 1 TABLET DAILY  . ranitidine (ZANTAC) 300 MG capsule [Pharmacy Med Name: RANITIDINE CAPS 300MG ] 90 capsule 0    Sig: TAKE 1 CAPSULE DAILY  patient needs to call for appointment

## 2011-06-02 ENCOUNTER — Other Ambulatory Visit: Payer: Self-pay | Admitting: *Deleted

## 2011-06-02 ENCOUNTER — Telehealth: Payer: Self-pay | Admitting: Cardiology

## 2011-06-02 MED ORDER — PRAVASTATIN SODIUM 80 MG PO TABS: 80.0000 mg | ORAL_TABLET | Freq: Every day | ORAL | Status: DC

## 2011-06-02 MED ORDER — LISINOPRIL 40 MG PO TABS: 40.0000 mg | ORAL_TABLET | Freq: Every day | ORAL | Status: DC

## 2011-06-02 MED ORDER — RANITIDINE HCL 300 MG PO CAPS: 300.0000 mg | ORAL_CAPSULE | Freq: Every morning | ORAL | Status: DC

## 2011-06-02 MED ORDER — HYDROCHLOROTHIAZIDE 25 MG PO TABS: 25.0000 mg | ORAL_TABLET | Freq: Every day | ORAL | Status: DC

## 2011-06-02 NOTE — Telephone Encounter (Signed)
Walk in pt Form " Pt Dropped Off Handicapped Placard for Completion & Refill Request" sent to Message Nurse 06/02/11/Km

## 2011-06-06 ENCOUNTER — Telehealth: Payer: Self-pay | Admitting: Cardiology

## 2011-06-06 NOTE — Telephone Encounter (Signed)
Daughter Rosey Bath returning nurse call, she can be reached at [phone]

## 2011-06-06 NOTE — Telephone Encounter (Signed)
Daughter aware paperwork has been completed and given to MR.  Once it has been scanned into the system they will be able to pick it up.  Pt is having trouble walking distances since being treated for lung cancer

## 2011-06-06 NOTE — Telephone Encounter (Signed)
NA and mailbox is unable to accept messages

## 2011-06-06 NOTE — Telephone Encounter (Signed)
NA and mailbox not taking message

## 2011-06-10 NOTE — Telephone Encounter (Signed)
LMOVM ( Daughter's Voicemail) Rosey Bath @ [phone] letting her Know Handicapped PlaCard for Father Ready for Pick-Up 06/10/11/Km

## 2011-06-16 DIAGNOSIS — H2510 Age-related nuclear cataract, unspecified eye: Secondary | ICD-10-CM | POA: Diagnosis not present

## 2011-06-17 ENCOUNTER — Telehealth: Payer: Self-pay | Admitting: Cardiology

## 2011-06-17 ENCOUNTER — Telehealth: Payer: Self-pay | Admitting: *Deleted

## 2011-06-17 NOTE — Telephone Encounter (Signed)
Pt's daughter called stating she needs medical clearance for pt's cataract's surgery, per Dr Donnald Garre she needs to contact medical doctor for medical clearance.  She also stated the eye MD wants clearance from who did his surgery.  Informed her it was Dr Edwyna Shell who did the surgery in 2007.  She verbalized understanding.  SLJ

## 2011-06-17 NOTE — Telephone Encounter (Signed)
Walk in Pt Form " Pt Dropped Off paper from [facility]" Sent to Avie Arenas  06/17/11/Km

## 2011-06-19 ENCOUNTER — Ambulatory Visit: Payer: Medicare Other | Admitting: Internal Medicine

## 2011-06-30 DIAGNOSIS — H2510 Age-related nuclear cataract, unspecified eye: Secondary | ICD-10-CM | POA: Diagnosis not present

## 2011-06-30 DIAGNOSIS — IMO0002 Reserved for concepts with insufficient information to code with codable children: Secondary | ICD-10-CM | POA: Diagnosis not present

## 2011-07-28 DIAGNOSIS — IMO0002 Reserved for concepts with insufficient information to code with codable children: Secondary | ICD-10-CM | POA: Diagnosis not present

## 2011-07-28 DIAGNOSIS — H2510 Age-related nuclear cataract, unspecified eye: Secondary | ICD-10-CM | POA: Diagnosis not present

## 2011-08-08 ENCOUNTER — Ambulatory Visit (INDEPENDENT_AMBULATORY_CARE_PROVIDER_SITE_OTHER): Payer: Medicare Other | Admitting: Internal Medicine

## 2011-08-08 ENCOUNTER — Encounter: Payer: Self-pay | Admitting: Internal Medicine

## 2011-08-08 VITALS — BP 138/80 | HR 80 | Temp 98.2°F | Resp 12 | Ht 67.5 in | Wt 171.8 lb

## 2011-08-08 DIAGNOSIS — E785 Hyperlipidemia, unspecified: Secondary | ICD-10-CM | POA: Diagnosis not present

## 2011-08-08 DIAGNOSIS — M791 Myalgia, unspecified site: Secondary | ICD-10-CM

## 2011-08-08 DIAGNOSIS — R7309 Other abnormal glucose: Secondary | ICD-10-CM | POA: Diagnosis not present

## 2011-08-08 DIAGNOSIS — I10 Essential (primary) hypertension: Secondary | ICD-10-CM | POA: Diagnosis not present

## 2011-08-08 DIAGNOSIS — Z0000 Encounter for general adult medical examination without abnormal findings: Secondary | ICD-10-CM | POA: Diagnosis not present

## 2011-08-08 DIAGNOSIS — C3490 Malignant neoplasm of unspecified part of unspecified bronchus or lung: Secondary | ICD-10-CM | POA: Diagnosis not present

## 2011-08-08 DIAGNOSIS — IMO0001 Reserved for inherently not codable concepts without codable children: Secondary | ICD-10-CM

## 2011-08-08 NOTE — Progress Notes (Signed)
Subjective:    Patient ID: [name] [name], male    DOB: 11/12/1934, 77 y.o.   MRN: [id number]  HPI Medicare Wellness Visit:  The following psychosocial & medical history were reviewed as required by Medicare.   Social history: caffeine: 3 cups/ day , alcohol:  35 beers / week ,  tobacco use :quit 1998  & exercise : physically active.   Home & personal  safety / fall risk:no issues, activities of daily living: no limitations, seatbelt use : yes , and smoke alarm employment : yes .  Power of Attorney/Living Will status : no Living Will  Vision ( as recorded per Nurse) & Hearing  evaluation :  Ophth exam 07/28/11.No hearing evaluation Orientation :oriented X 3 , memory & recall :good,  math testing: good,and mood & affect : normal . Depression / anxiety:denied Travel history : never , immunization status :unsure , transfusion history:  no, and preventive health surveillance ( colonoscopies, BMD , etc as per protocol/ [facility]): never had colonoscopy (SOC discussed), Dental care:  dentures . Chart reviewed &  Updated. Active issues reviewed & addressed.       Review of Systems HYPERTENSION: Disease Monitoring: Blood pressure range-not monitored  Chest pain, palpitations-no       Dyspnea- no Medications: Compliance-yes Lightheadedness,Syncope- no    Edema-no  FASTING HYPERGLYCEMIA, PMH of:   Polyuria/phagia/dipsia- no       Visual problems- S/P cataract extraction   HYPERLIPIDEMIA: Disease Monitoring: See symptoms for Hypertension Medications: Compliance- yes Abd pain, bowel changes- no   Muscle aches-cramps in calves @ rest           Objective:   Physical Exam Gen.: Healthy and well-nourished in appearance. Alert, appropriate and cooperative throughout exam.Appears younger than stated age  Head: Normocephalic without obvious abnormalities; pattern  alopecia  Eyes: No corneal or conjunctival inflammation noted. Pupils equal round reactive to light and accommodation.  Extraocular  motion intact.  Ears: External  ear exam reveals no significant lesions or deformities. Canals clear .TMs normal. Hearing is grossly decreased on L. Nose: External nasal exam reveals no deformity or inflammation. Nasal mucosa are pink and moist. No lesions or exudates noted.  Mouth: Oral mucosa and oropharynx reveal no lesions or exudates. Upper plate lower partial Neck: No deformities, masses, or tenderness noted.  Thyroid  normal Lungs: Normal respiratory effort. Bronchovesicular breath sounds are present on the left Heart: Normal rate and rhythm. Normal S1 and S2. No gallop, click, or rub. S4 without murmur. Heart sounds are decreased on the left Abdomen: Bowel sounds normal; abdomen soft and nontender. No masses, organomegaly or hernias noted. Genitalia:deferred due to age                                                                                   Musculoskeletal/extremities: No deformity or scoliosis noted of  the thoracic or lumbar spine. No clubbing, cyanosis, edema. Osteoarthritic finger deformities noted. Tone & strength  normal. Nail health  good. Vascular: Carotid, radial artery, dorsalis pedis and  posterior tibial pulses are full and equal. No bruits present. Neurologic: Alert and oriented x3. Deep tendon reflexes symmetrical and normal.  Skin: Marked solar keratotic lesions of the forearms; forms deeply tanned Lymph: No cervical, axillary lymphadenopathy present. Psych: Mood and affect are normal. Normally interactive                                                                                         Assessment & Plan:  #1 Medicare Wellness Exam; criteria met ; data entered #2 Problem List reviewed ; Assessment/ Recommendations made #3 forearm lesions should be evaluated for pre-malignant change #4 muscle cramps; I doubt this is related to statin but CK should be checked. Vitamin D 3 1000 IU & Mag/Cal would be recommended as needed along with isometric  exercises Plan: see Orders

## 2011-08-08 NOTE — Assessment & Plan Note (Signed)
LDL was 79 09/10/10; HDL is 102. Minimal LDL goal is less than 100.

## 2011-08-08 NOTE — Assessment & Plan Note (Signed)
Blood pressure control is adequate; ideally be an average less than 135/85. Renal function was normal 10/07/10; recheck indicated

## 2011-08-08 NOTE — Patient Instructions (Addendum)
Preventive Health Care: Exercise at least 30-45 minutes a day,  3-4 days a week.  Eat a low-fat diet with lots of fruits and vegetables, up to 7-9 servings per day.  Consume less than 40 grams of sugar per day from foods & drinks with High Fructose Corn Sugar as # 1,2,3 or # 4 on label. Please perform isometric exercises before going to bed; magcal may help the muscle symptoms as we discussed.  Alcohol If you drink, do it moderately,less than 9 drinks per week, preferably less than 6 @ most. Health Care Power of Attorney & Living Will. Complete if not in place ; these place you in charge of your health care decisions. Depression is common in our stressful world.If you're feeling down or losing interest in things you normally enjoy, please call . Please  schedule fasting Labs in mid-late July : CK,BMET,Lipids, hepatic panel, CBC & dif, TSH,A1c. PLEASE BRING THESE INSTRUCTIONS TO FOLLOW UP  LAB APPOINTMENT.This will guarantee correct labs are drawn, eliminating need for repeat blood sampling ( needle sticks ! ). Diagnoses /Codes: 272.4,790.29,401.9,729.1,162.9.  Please try to go on My Chart within  24 hours after blood draw  to allow me to release the results directly to you.    To prevent sleep dysfunction follow these instructions for sleep hygiene. Do not read, watch TV, or eat in bed. Do not get into bed until you are ready to turn off the light &  to go to sleep. Do not ingest stimulants ( decongestants, diet pills, nicotine, caffeine) after the evening meal.

## 2011-08-22 ENCOUNTER — Encounter: Payer: Self-pay | Admitting: Cardiology

## 2011-08-22 ENCOUNTER — Ambulatory Visit (INDEPENDENT_AMBULATORY_CARE_PROVIDER_SITE_OTHER): Payer: Medicare Other | Admitting: Cardiology

## 2011-08-22 VITALS — BP 125/80 | HR 67 | Ht 67.0 in | Wt 169.8 lb

## 2011-08-22 DIAGNOSIS — I10 Essential (primary) hypertension: Secondary | ICD-10-CM | POA: Diagnosis not present

## 2011-08-22 DIAGNOSIS — I2510 Atherosclerotic heart disease of native coronary artery without angina pectoris: Secondary | ICD-10-CM | POA: Diagnosis not present

## 2011-08-22 DIAGNOSIS — E785 Hyperlipidemia, unspecified: Secondary | ICD-10-CM

## 2011-08-22 DIAGNOSIS — I498 Other specified cardiac arrhythmias: Secondary | ICD-10-CM | POA: Diagnosis not present

## 2011-08-22 DIAGNOSIS — R001 Bradycardia, unspecified: Secondary | ICD-10-CM

## 2011-08-22 NOTE — Assessment & Plan Note (Signed)
The blood pressure is at target. No change in medications is indicated. We will continue with therapeutic lifestyle changes (TLC).  

## 2011-08-22 NOTE — Assessment & Plan Note (Signed)
I'll have him get a fasting lipid profile in a few days when he has labs drawn by Dr. Alwyn Ren. The goal is an LDL less than 100 and HDL greater than 40.

## 2011-08-22 NOTE — Progress Notes (Signed)
   HPI The patient presents for one year followup.  Since I last saw him he has had no new cardiovascular complaints. He doesn't exercise routinely though he stays active doing some light chores around his house.  His garden was eaten by deer this year.  He denies any chest pressure, neck or arm discomfort. He denies any palpitations, presyncope or syncope. He has no shortness of breath, PND or orthopnea. He has had no weight gain or edema.  No Known Allergies  Current Outpatient Prescriptions  Medication Sig Dispense Refill  . aspirin 325 MG tablet Take 325 mg by mouth daily.        . hydrochlorothiazide (HYDRODIURIL) 25 MG tablet Take 1 tablet (25 mg total) by mouth daily.  90 tablet  0  . KLOR-CON 20 MEQ packet MIX AND DRINK 1 PACKET ONCE DAILY  90 packet  1  . lisinopril (PRINIVIL,ZESTRIL) 40 MG tablet Take 1 tablet (40 mg total) by mouth daily.  90 tablet  0  . pravastatin (PRAVACHOL) 80 MG tablet Take 1 tablet (80 mg total) by mouth daily.  90 tablet  0  . ranitidine (ZANTAC) 300 MG capsule Take 1 capsule (300 mg total) by mouth every morning.  90 capsule  0    Past Medical History  Diagnosis Date  . BRADYCARDIA   . CAD   . DYSLIPIDEMIA   . HYPERTENSION   . POSTSURG PERCUT TRANSLUMINAL COR ANGPLSTY STS     LAD 50%, mid RCA 90% treated with BMS 2008  . SLEEP DISORDER   . Squamous cell lung cancer 2006    Past Surgical History  Procedure Date  . Pneumonectomy     chemotherapy in 2006  . Cataract extraction, bilateral 2013  . Coronary artery stent 2008    ROS:  As stated in the HPI and negative for all other systems.  PHYSICAL EXAM BP 125/80  Pulse 67  Ht 5\' 7"  (1.702 m)  Wt 169 lb 12.8 oz (77.021 kg)  BMI 26.59 kg/m2 GENERAL:  Well appearing HEENT:  Pupils equal round and reactive, fundi not visualized, oral mucosa unremarkable, dentures NECK:  No jugular venous distention, waveform within normal limits, carotid upstroke brisk and symmetric, no bruits, no  thyromegaly LUNGS:  Clear to auscultation bilaterally BACK:  No CVA tenderness CHEST:  Unremarkable HEART:  PMI not displaced or sustained,S1 and S2 within normal limits, no S3, no S4, no clicks, no rubs, no murmurs ABD:  Flat, positive bowel sounds normal in frequency in pitch, no bruits, no rebound, no guarding, no midline pulsatile mass, no hepatomegaly, no splenomegaly EXT:  2 plus pulses throughout, no edema, no cyanosis no clubbing  EKG:  Sinus rhythm, rate 67, axis within normal limits, intervals within normal limits, no acute ST-T wave changes.  08/22/2011  ASSESSMENT AND PLAN

## 2011-08-22 NOTE — Assessment & Plan Note (Signed)
He had a negative exercise treadmill test last year. He has had no new symptoms. No change in therapy is indicated.

## 2011-08-22 NOTE — Patient Instructions (Addendum)
Your physician wants you to follow-up in: 1 year.  You will receive a reminder letter in the mail two months in advance. If you don't receive a letter, please call our office to schedule the follow-up appointment.  Your physician recommends that you return for lab work in: Fasting lipid when you see Dr Alwyn Ren

## 2011-09-08 ENCOUNTER — Other Ambulatory Visit: Payer: Self-pay | Admitting: Cardiology

## 2011-09-08 DIAGNOSIS — H16149 Punctate keratitis, unspecified eye: Secondary | ICD-10-CM | POA: Diagnosis not present

## 2011-09-09 NOTE — Telephone Encounter (Signed)
..   Requested Prescriptions   Signed Prescriptions Disp Refills  . lisinopril (PRINIVIL,ZESTRIL) 40 MG tablet 90 tablet 3    Sig: TAKE 1 TABLET DAILY    Authorizing Provider: Rollene Rotunda    Ordering User: Kaylum Shrum M  . ranitidine (ZANTAC) 300 MG capsule 90 capsule 3    Sig: Take 1 capsule (300 mg total) by mouth daily.    Authorizing Provider: Rollene Rotunda    Ordering User: Nichoals Heyde M  . KLOR-CON 20 MEQ packet 90 packet 3    Sig: MIX AND DRINK 1 PACKET ONCE DAILY    Authorizing Provider: Rollene Rotunda    Ordering User: Vivek Grealish M  . hydrochlorothiazide (HYDRODIURIL) 25 MG tablet 90 tablet 3    Sig: TAKE 1 TABLET DAILY    Authorizing Provider: Rollene Rotunda    Ordering User: Malynn Lucy M  . pravastatin (PRAVACHOL) 80 MG tablet 90 tablet 3    Sig: TAKE 1 TABLET AT BEDTIME    Authorizing Provider: Rollene Rotunda    Ordering User: Christella Hartigan, Leilyn Frayre Judie Petit

## 2011-09-10 ENCOUNTER — Other Ambulatory Visit (INDEPENDENT_AMBULATORY_CARE_PROVIDER_SITE_OTHER): Payer: Medicare Other

## 2011-09-10 DIAGNOSIS — E785 Hyperlipidemia, unspecified: Secondary | ICD-10-CM

## 2011-09-10 DIAGNOSIS — R7309 Other abnormal glucose: Secondary | ICD-10-CM | POA: Diagnosis not present

## 2011-09-10 DIAGNOSIS — IMO0001 Reserved for inherently not codable concepts without codable children: Secondary | ICD-10-CM

## 2011-09-10 DIAGNOSIS — I10 Essential (primary) hypertension: Secondary | ICD-10-CM

## 2011-09-10 LAB — CBC WITH DIFFERENTIAL/PLATELET
Lymphocytes Relative: 17.9 % (ref 12.0–46.0)
Lymphs Abs: 1.2 10*3/uL (ref 0.7–4.0)
MCHC: 33.6 g/dL (ref 30.0–36.0)
MCV: 97.4 fl (ref 78.0–100.0)
Neutrophils Relative %: 63.5 % (ref 43.0–77.0)
RDW: 14.3 % (ref 11.5–14.6)

## 2011-09-10 LAB — LIPID PANEL
Cholesterol: 202 mg/dL — ABNORMAL HIGH (ref 0–200)
VLDL: 8.6 mg/dL (ref 0.0–40.0)

## 2011-09-10 LAB — BASIC METABOLIC PANEL
BUN: 18 mg/dL (ref 6–23)
Calcium: 9.9 mg/dL (ref 8.4–10.5)
GFR: 57.69 mL/min — ABNORMAL LOW (ref >60.00)
Potassium: 4.4 mEq/L (ref 3.5–5.1)
Sodium: 131 mEq/L — ABNORMAL LOW (ref 135–145)

## 2011-09-10 LAB — TSH: TSH: 1.25 u[IU]/mL (ref 0.35–5.50)

## 2011-09-10 NOTE — Progress Notes (Signed)
Lab only 

## 2011-10-03 ENCOUNTER — Other Ambulatory Visit: Payer: Medicare Other | Admitting: Lab

## 2011-10-03 ENCOUNTER — Ambulatory Visit (HOSPITAL_COMMUNITY)
Admission: RE | Admit: 2011-10-03 | Discharge: 2011-10-03 | Disposition: A | Discharge status: Home / Self Care | Payer: Medicare Other | Source: Ambulatory Visit | Attending: Internal Medicine | Admitting: Internal Medicine

## 2011-10-03 DIAGNOSIS — I700 Atherosclerosis of aorta: Secondary | ICD-10-CM | POA: Insufficient documentation

## 2011-10-03 DIAGNOSIS — C3410 Malignant neoplasm of upper lobe, unspecified bronchus or lung: Secondary | ICD-10-CM | POA: Diagnosis not present

## 2011-10-03 DIAGNOSIS — Z9221 Personal history of antineoplastic chemotherapy: Secondary | ICD-10-CM | POA: Diagnosis not present

## 2011-10-03 DIAGNOSIS — Z902 Acquired absence of lung [part of]: Secondary | ICD-10-CM | POA: Diagnosis not present

## 2011-10-03 DIAGNOSIS — I2510 Atherosclerotic heart disease of native coronary artery without angina pectoris: Secondary | ICD-10-CM | POA: Insufficient documentation

## 2011-10-03 DIAGNOSIS — C3490 Malignant neoplasm of unspecified part of unspecified bronchus or lung: Secondary | ICD-10-CM | POA: Diagnosis not present

## 2011-10-03 DIAGNOSIS — J90 Pleural effusion, not elsewhere classified: Secondary | ICD-10-CM | POA: Diagnosis not present

## 2011-10-03 DIAGNOSIS — R918 Other nonspecific abnormal finding of lung field: Secondary | ICD-10-CM | POA: Insufficient documentation

## 2011-10-03 DIAGNOSIS — N289 Disorder of kidney and ureter, unspecified: Secondary | ICD-10-CM | POA: Insufficient documentation

## 2011-10-03 LAB — CBC WITH DIFFERENTIAL/PLATELET
Eosinophils Absolute: 0.3 10*3/uL (ref 0.0–0.5)
HGB: 14.6 g/dL (ref 13.0–17.1)
LYMPH%: 22 % (ref 14.0–49.0)
MONO#: 0.9 10*3/uL (ref 0.1–0.9)
NEUT#: 3.8 10*3/uL (ref 1.5–6.5)
Platelets: 257 10*3/uL (ref 140–400)
RBC: 4.47 10*6/uL (ref 4.20–5.82)
RDW: 13.8 % (ref 11.0–14.6)
WBC: 6.3 10*3/uL (ref 4.0–10.3)

## 2011-10-03 LAB — COMPREHENSIVE METABOLIC PANEL
Albumin: 4.2 g/dL (ref 3.5–5.2)
CO2: 26 mEq/L (ref 19–32)
Glucose, Bld: 90 mg/dL (ref 70–99)
Potassium: 4.6 mEq/L (ref 3.5–5.3)
Sodium: 132 mEq/L — ABNORMAL LOW (ref 135–145)
Total Protein: 7.2 g/dL (ref 6.0–8.3)

## 2011-10-08 ENCOUNTER — Telehealth: Payer: Self-pay | Admitting: Internal Medicine

## 2011-10-08 ENCOUNTER — Ambulatory Visit (HOSPITAL_BASED_OUTPATIENT_CLINIC_OR_DEPARTMENT_OTHER): Payer: Medicare Other | Admitting: Internal Medicine

## 2011-10-08 VITALS — BP 181/87 | HR 67 | Temp 96.8°F | Resp 20 | Ht 67.0 in | Wt 172.4 lb

## 2011-10-08 DIAGNOSIS — C3410 Malignant neoplasm of upper lobe, unspecified bronchus or lung: Secondary | ICD-10-CM

## 2011-10-08 DIAGNOSIS — C3490 Malignant neoplasm of unspecified part of unspecified bronchus or lung: Secondary | ICD-10-CM

## 2011-10-08 NOTE — Progress Notes (Signed)
[facility] [phone]   Fax:([phone]  OFFICE PROGRESS NOTE  Marga Melnick, MD [phone] [address] [place] [address]  DIAGNOSIS: Stage IIIa (T3, N2, M0) non-small cell lung cancer consistent with squamous cell carcinoma involving the left hilum and AP window mediastinal lymph nodes diagnosed in September of 2006.  PRIOR THERAPY: #1 status post 3 cycles of neoadjuvant chemotherapy with cisplatin and docetaxel. Last dose was given 12/20/2004 was significant response to the treatment. #2 status post left radical nephrectomy under the care of Dr. Edwyna Shell on 02/21/2005 and the final pathology was consistent with a stage II B. (T2, N1, MX) non-small cell lung cancer. #3 status post 3 cycles of adjuvant chemotherapy was carboplatin and paclitaxel. Last dose was given 05/20/2005.  CURRENT THERAPY: Observation.  INTERVAL HISTORY: [name] [name] 77 y.o. male returns to the clinic today for routine annual followup visit. The patient is doing fine with no specific complaints. He denied having any significant weight loss or night sweats. He has no chest pain, shortness breath, cough or hemoptysis. He has repeat CT scan of the chest performed recently and he is here today for evaluation and discussion of his scan results.  MEDICAL HISTORY: Past Medical History  Diagnosis Date  . BRADYCARDIA   . CAD   . DYSLIPIDEMIA   . HYPERTENSION   . POSTSURG PERCUT TRANSLUMINAL COR ANGPLSTY STS     LAD 50%, mid RCA 90% treated with BMS 2008  . SLEEP DISORDER   . Squamous cell lung cancer 2006    ALLERGIES:   has no known allergies.  MEDICATIONS:  Current Outpatient Prescriptions  Medication Sig Dispense Refill  . aspirin 325 MG tablet Take 325 mg by mouth daily.        . hydrochlorothiazide (HYDRODIURIL) 25 MG tablet TAKE 1 TABLET DAILY  90 tablet  3  . KLOR-CON 20 MEQ packet MIX AND DRINK 1 PACKET ONCE DAILY  90 packet  3  . lisinopril  (PRINIVIL,ZESTRIL) 40 MG tablet TAKE 1 TABLET DAILY  90 tablet  3  . pravastatin (PRAVACHOL) 80 MG tablet TAKE 1 TABLET AT BEDTIME  90 tablet  3  . ranitidine (ZANTAC) 300 MG capsule Take 1 capsule (300 mg total) by mouth every morning.  90 capsule  0  . DISCONTD: ranitidine (ZANTAC) 300 MG capsule Take 1 capsule (300 mg total) by mouth daily.  90 capsule  3    SURGICAL HISTORY:  Past Surgical History  Procedure Date  . Pneumonectomy     chemotherapy in 2006  . Cataract extraction, bilateral 2013  . Coronary artery stent 2008    REVIEW OF SYSTEMS:  A comprehensive review of systems was negative.   PHYSICAL EXAMINATION: General appearance: alert, cooperative and no distress Neck: no adenopathy Lymph nodes: Cervical, supraclavicular, and axillary nodes normal. Resp: clear to auscultation bilaterally Cardio: regular rate and rhythm, S1, S2 normal, no murmur, click, rub or gallop GI: soft, non-tender; bowel sounds normal; no masses,  no organomegaly Extremities: extremities normal, atraumatic, no cyanosis or edema  ECOG PERFORMANCE STATUS: 1 - Symptomatic but completely ambulatory  Blood pressure 181/87, pulse 67, temperature 96.8 F (36 C), temperature source Oral, resp. rate 20, height 5\' 7"  (1.702 m), weight 172 lb 6.4 oz (78.2 kg).  LABORATORY DATA: Lab Results  Component Value Date   WBC 6.3 10/03/2011   HGB 14.6 10/03/2011   HCT 43.2 10/03/2011   MCV 96.6 10/03/2011  PLT 257 10/03/2011      Chemistry      Component Value Date/Time   NA 132* 10/03/2011 0720   NA 130 10/07/2010 0803   K 4.6 10/03/2011 0720   K 4.6 10/07/2010 0803   CL 96 10/03/2011 0720   CL 87* 10/07/2010 0803   CO2 26 10/03/2011 0720   CO2 27 10/07/2010 0803   BUN 19 10/03/2011 0720   BUN 14 10/07/2010 0803   CREATININE 1.30 10/03/2011 0720   CREATININE 1.2 10/07/2010 0803      Component Value Date/Time   CALCIUM 9.8 10/03/2011 0720   CALCIUM 9.2 10/07/2010 0803   ALKPHOS 40 10/03/2011 0720   ALKPHOS 47  10/07/2010 0803   AST 18 10/03/2011 0720   AST 25 10/07/2010 0803   ALT 9 10/03/2011 0720   BILITOT 0.5 10/03/2011 0720   BILITOT 0.90 10/07/2010 0803       RADIOGRAPHIC STUDIES: Ct Chest Wo Contrast  10/03/2011  *RADIOLOGY REPORT*  Clinical Data: Lung cancer status post left lung surgery and chemotherapy (completed in 2007).  CT CHEST WITHOUT CONTRAST  Technique:  Multidetector CT imaging of the chest was performed following the standard protocol without IV contrast.  Comparison: Chest CT 10/07/2010  Findings:  Mediastinum: Chronic shift of cardiomediastinal structures into the left hemithorax. Heart size is normal. There is no significant pericardial fluid, thickening or pericardial calcification. There is atherosclerosis of the thoracic aorta, the great vessels of the mediastinum and the coronary arteries, including calcified atherosclerotic plaque in the left main, left anterior descending, left circumflex and right coronary arteries. No definite pathologically enlarged mediastinal or hilar lymph nodes. Please note that accurate exclusion of hilar adenopathy is limited on noncontrast CT scans.  Lungs/Pleura: Status post left pneumonectomy.  Tiny chronic thick- walled left-sided pleural effusion is unchanged.  Several small calcified nodules in the right lung are compatible with granulomas, and are similar to prior examinations.  No definite new or enlarging suspicious appearing pulmonary nodules or masses are otherwise identified within the right lung.  No right-sided pleural effusion.  No consolidative airspace disease in the right lung.  Upper Abdomen: Exophytic 2.7 cm low attenuation lesion extending from the superior pole of the left kidney is unchanged, and was previously characterized as a simple cyst.  Calcified granuloma in the liver.  Musculoskeletal: There are no aggressive appearing lytic or blastic lesions noted in the visualized portions of the skeleton.  IMPRESSION: 1.  Status post left  pneumonectomy, without evidence of residual or recurrent disease in the thorax. 2. Atherosclerosis, including left main and three-vessel coronary artery disease. Assessment for potential risk factor modification, dietary therapy or pharmacologic therapy may be warranted, if clinically indicated. 3.  Sequelae of old granulomatous disease, as above.  Original Report Authenticated By: Florencia Reasons, M.D.    ASSESSMENT: This is a very pleasant 77 years old white male with history of stage IIIa non-small cell lung cancer status post neoadjuvant chemotherapy followed by left radical pneumonectomy followed by adjuvant chemotherapy and has been observation since April 2007 with no evidence for disease recurrence.   PLAN: I discussed the scan results with the patient and recommended for him to continue on observation for now with repeat CT scan of the chest without contrast in one year. The patient would come back for followup visit at that time.  He was advised to call me immediately if he has any concerning symptoms in the interval.  All questions were answered. The patient knows to  call the clinic with any problems, questions or concerns. We can certainly see the patient much sooner if necessary.

## 2011-10-08 NOTE — Telephone Encounter (Signed)
appts made and printed for pt aom °

## 2012-01-09 DIAGNOSIS — L570 Actinic keratosis: Secondary | ICD-10-CM | POA: Diagnosis not present

## 2012-03-26 DIAGNOSIS — L570 Actinic keratosis: Secondary | ICD-10-CM | POA: Diagnosis not present

## 2012-05-14 ENCOUNTER — Encounter: Payer: Self-pay | Admitting: Internal Medicine

## 2012-05-14 ENCOUNTER — Ambulatory Visit (INDEPENDENT_AMBULATORY_CARE_PROVIDER_SITE_OTHER): Payer: Medicare Other | Admitting: Internal Medicine

## 2012-05-14 VITALS — BP 118/76 | HR 71 | Temp 97.6°F | Wt 168.0 lb

## 2012-05-14 DIAGNOSIS — R1033 Periumbilical pain: Secondary | ICD-10-CM | POA: Diagnosis not present

## 2012-05-14 DIAGNOSIS — I10 Essential (primary) hypertension: Secondary | ICD-10-CM

## 2012-05-14 DIAGNOSIS — R252 Cramp and spasm: Secondary | ICD-10-CM | POA: Diagnosis not present

## 2012-05-14 DIAGNOSIS — G479 Sleep disorder, unspecified: Secondary | ICD-10-CM

## 2012-05-14 LAB — CBC WITH DIFFERENTIAL/PLATELET
Basophils Relative: 0.4 % (ref 0.0–3.0)
Eosinophils Absolute: 0.1 10*3/uL (ref 0.0–0.7)
MCHC: 34.1 g/dL (ref 30.0–36.0)
MCV: 93.1 fl (ref 78.0–100.0)
Monocytes Absolute: 1.2 10*3/uL — ABNORMAL HIGH (ref 0.1–1.0)
Neutro Abs: 3.4 10*3/uL (ref 1.4–7.7)
Neutrophils Relative %: 56.8 % (ref 43.0–77.0)
RBC: 4.7 Mil/uL (ref 4.22–5.81)

## 2012-05-14 LAB — BASIC METABOLIC PANEL
CO2: 24 mEq/L (ref 19–32)
Calcium: 9.3 mg/dL (ref 8.4–10.5)
Glucose, Bld: 87 mg/dL (ref 70–99)
Sodium: 125 mEq/L — ABNORMAL LOW (ref 135–145)

## 2012-05-14 MED ORDER — CLONAZEPAM 0.5 MG PO TABS: ORAL_TABLET | ORAL | Status: DC

## 2012-05-14 NOTE — Patient Instructions (Addendum)
To prevent sleep dysfunction follow these instructions for sleep hygiene. Do not read, watch TV, or eat in bed. Do not get into bed until you are ready to turn off the light &  to go to sleep. Do not ingest stimulants ( decongestants, diet pills, nicotine, caffeine) after the evening meal. Reflux of gastric acid may be asymptomatic as this may occur mainly during sleep.The triggers for reflux  include stress; the "aspirin family" ; alcohol; peppermint; and caffeine (coffee, tea, cola, and chocolate). The aspirin family would include aspirin and the nonsteroidal agents such as ibuprofen &  Naproxen. Tylenol would not cause reflux. If having symptoms ; food & drink should be avoided for @ least 2 hours before going to bed.  Prilosec OTC 30 min pre b'fast as trial. Please complete and return stool cards; these will determine whether there is any gastrointestinal bleeding risk.  If you activate the  My Chart system; lab & Xray results will be released directly  to you as soon as I review & address these through the computer. If you choose not to sign up for My Chart within 36 hours of labs being drawn; results will be reviewed & interpretation added before being copied & mailed, causing a delay in getting the results to you.If you do not receive that report within 7-10 days ,please call. Additionally you can use this system to gain direct  access to your records  if  out of town or @ an office of a  physician who is not in  the My Chart network.  This improves continuity of care & places you in control of your medical record.

## 2012-05-14 NOTE — Progress Notes (Signed)
  Subjective:    Patient ID: [name] [name], male    DOB: Jul 05, 1934, 78 y.o.   MRN: [id number]  HPI   Insomnia Onset:> 7 years ago after wife's death ;progressive in past year w/o additional trigger  Pattern: Difficulty going to sleep:yes Frequent awakening:yes Early awakening:yes Nightmares:no Abnormal leg movement:not observed (lives alone) but cramping in calves @ night Snoring:not observed Apnea:not onbserved  Risk factors/sleep hygiene: Stimulants:denied Alcohol intake:2-3 beers during day Reading, watching TV, eating in bed: no Daytime naps:no Stress/anxiety:daughters state possibly Work/travel factors:no  Impact: Daytime hypersomnolence: yes Motor vehicle accident/motor dysfunction: no Treatment to date/efficacy:      Review of Systems   He describes intermittent periumbilical burning discomfort without associated unexplained weight loss, significant dyspepsia, dysphagia, or rectal bleeding. His stools have been dark but he has been taking Pepto-Bismol for the abdominal symptoms.     Objective:   Physical Exam Gen.: Healthy and well-nourished in appearance. Alert, appropriate and cooperative throughout exam.Appears younger than stated age  Head: Normocephalic without obvious abnormalities Eyes: No corneal or conjunctival inflammation noted. No icterus Nose: External nasal exam reveals no deformity or inflammation. Nasal mucosa are pink and moist. No lesions or exudates noted.  Mouth: Oral mucosa and oropharynx reveal no lesions or exudates. Upper denture & lower partial Neck: No deformities, masses, or tenderness noted. Thyroid normal. Lungs: Normal respiratory effort; chest expands symmetrically. Lungs are clear to auscultation without rales, wheezes, or increased work of breathing. Heart: Normal rate and rhythm. Normal S1 and S2. No gallop, click, or rub. Apical S 4 w/o murmur. Abdomen: Bowel sounds normal; abdomen soft and nontender. No masses, organomegaly or  hernias noted.                                  Musculoskeletal/extremities:  No clubbing, cyanosis,or  Edema.Joints  reveal mild  DJD DIP changes. Nail health good. Able to lie down & sit up w/o help. Negative SLR bilaterally Vascular: Carotid, radial artery, dorsalis pedis and  posterior tibial pulses are full and equal. No bruits present. Neurologic: Alert and oriented x3. Deep tendon reflexes symmetrical and normal.    Skin: Intact without suspicious lesions or rashes. Lymph: No cervical, axillary lymphadenopathy present. Psych: Mood and affect are normal. Normally interactive                                                                                        Assessment & Plan:   #1 chronic sleep disorder; rule out sleep apnea or other contributing factor such as daytime naps  #2 leg cramps; rule out hypokalemia related to diuretic  #3 epigastric discomfort; but stool most likely related to Pepto-Bismol  Plan: See orders & recommendations. His daughters will review his sleep pattern with his fishing buddies. If possible they have been asked to observe his nocturnal sleep pattern as well.

## 2012-05-19 ENCOUNTER — Encounter: Payer: Self-pay | Admitting: Internal Medicine

## 2012-06-25 ENCOUNTER — Telehealth: Payer: Self-pay | Admitting: General Practice

## 2012-06-25 MED ORDER — ALPRAZOLAM 0.25 MG PO TABS: ORAL_TABLET | ORAL | Status: DC

## 2012-06-25 NOTE — Telephone Encounter (Signed)
D/C Clonazepam ; alprazolam 0.25 q 8-12 PRN only #30

## 2012-06-25 NOTE — Telephone Encounter (Signed)
Before the medication is changed; please increase the clonazepam to one whole pill at bedtime. If response is suboptimal; we could consider changing medications.

## 2012-06-25 NOTE — Telephone Encounter (Signed)
Med filled #30 per hopp.

## 2012-06-25 NOTE — Telephone Encounter (Signed)
Pt daughter called the office. Wanted [place]opp to know that the clonazepam that was prescribed at April office visit is not really working. Pt daughter would like to know if we could either increase the dose or change the medication to a XANAX type mediation (pt tried Xanax and states it worked great). They would like to stay in the medications to pertain to anxiety and steer clear of sleep aids. Would prefer the Rx to go to [facility]. Please advise.

## 2012-06-25 NOTE — Telephone Encounter (Signed)
Pt daughter called back and she stated that pt was not aware he should only be on 1/2 tablet. Has been on e a whole tablet since it was prescribed.

## 2012-07-13 ENCOUNTER — Encounter: Payer: Self-pay | Admitting: Cardiology

## 2012-09-10 ENCOUNTER — Ambulatory Visit: Payer: Medicare Other | Admitting: Cardiology

## 2012-10-01 ENCOUNTER — Other Ambulatory Visit: Payer: Self-pay | Admitting: Cardiology

## 2012-10-05 ENCOUNTER — Ambulatory Visit (HOSPITAL_COMMUNITY)
Admission: RE | Admit: 2012-10-05 | Discharge: 2012-10-05 | Disposition: A | Discharge status: Home / Self Care | Payer: Medicare Other | Source: Ambulatory Visit | Attending: Internal Medicine | Admitting: Internal Medicine

## 2012-10-05 ENCOUNTER — Other Ambulatory Visit (HOSPITAL_BASED_OUTPATIENT_CLINIC_OR_DEPARTMENT_OTHER): Payer: Medicare Other | Admitting: Lab

## 2012-10-05 ENCOUNTER — Encounter (HOSPITAL_COMMUNITY): Payer: Self-pay

## 2012-10-05 DIAGNOSIS — C3490 Malignant neoplasm of unspecified part of unspecified bronchus or lung: Secondary | ICD-10-CM | POA: Diagnosis not present

## 2012-10-05 DIAGNOSIS — E871 Hypo-osmolality and hyponatremia: Secondary | ICD-10-CM | POA: Diagnosis not present

## 2012-10-05 DIAGNOSIS — Z9221 Personal history of antineoplastic chemotherapy: Secondary | ICD-10-CM | POA: Insufficient documentation

## 2012-10-05 DIAGNOSIS — N281 Cyst of kidney, acquired: Secondary | ICD-10-CM | POA: Diagnosis not present

## 2012-10-05 DIAGNOSIS — J90 Pleural effusion, not elsewhere classified: Secondary | ICD-10-CM | POA: Diagnosis not present

## 2012-10-05 DIAGNOSIS — I2510 Atherosclerotic heart disease of native coronary artery without angina pectoris: Secondary | ICD-10-CM | POA: Insufficient documentation

## 2012-10-05 LAB — CBC WITH DIFFERENTIAL/PLATELET
Basophils Absolute: 0.1 10*3/uL (ref 0.0–0.1)
EOS%: 3.4 % (ref 0.0–7.0)
Eosinophils Absolute: 0.2 10*3/uL (ref 0.0–0.5)
HGB: 15 g/dL (ref 13.0–17.1)
MCH: 32.5 pg (ref 27.2–33.4)
MCV: 95.3 fL (ref 79.3–98.0)
MONO%: 13.6 % (ref 0.0–14.0)
NEUT#: 4.2 10*3/uL (ref 1.5–6.5)
RBC: 4.61 10*6/uL (ref 4.20–5.82)
RDW: 13.8 % (ref 11.0–14.6)
lymph#: 1.7 10*3/uL (ref 0.9–3.3)

## 2012-10-05 LAB — COMPREHENSIVE METABOLIC PANEL (CC13)
AST: 18 U/L (ref 5–34)
Alkaline Phosphatase: 47 U/L (ref 40–150)
BUN: 16.7 mg/dL (ref 7.0–26.0)
Creatinine: 1.4 mg/dL — ABNORMAL HIGH (ref 0.7–1.3)
Potassium: 4.2 mEq/L (ref 3.5–5.1)
Total Bilirubin: 0.6 mg/dL (ref 0.20–1.20)

## 2012-10-07 ENCOUNTER — Ambulatory Visit (HOSPITAL_BASED_OUTPATIENT_CLINIC_OR_DEPARTMENT_OTHER): Payer: Medicare Other | Admitting: Internal Medicine

## 2012-10-07 ENCOUNTER — Telehealth: Payer: Self-pay | Admitting: Internal Medicine

## 2012-10-07 ENCOUNTER — Other Ambulatory Visit: Payer: Self-pay | Admitting: Medical Oncology

## 2012-10-07 ENCOUNTER — Telehealth: Payer: Self-pay | Admitting: Medical Oncology

## 2012-10-07 ENCOUNTER — Encounter: Payer: Self-pay | Admitting: Internal Medicine

## 2012-10-07 VITALS — BP 176/84 | HR 67 | Temp 97.0°F | Resp 20 | Ht 67.0 in | Wt 168.7 lb

## 2012-10-07 DIAGNOSIS — I10 Essential (primary) hypertension: Secondary | ICD-10-CM | POA: Diagnosis not present

## 2012-10-07 DIAGNOSIS — C3490 Malignant neoplasm of unspecified part of unspecified bronchus or lung: Secondary | ICD-10-CM

## 2012-10-07 MED ORDER — CLONIDINE HCL 0.1 MG PO TABS
0.2000 mg | ORAL_TABLET | Freq: Once | ORAL | Status: AC
  Administered 2012-10-07: 0.2 mg via ORAL

## 2012-10-07 NOTE — Telephone Encounter (Signed)
gave pt appt for lab and MD on August 2015 °

## 2012-10-07 NOTE — Telephone Encounter (Signed)
I left message on daughters phone to call me back with pts current med list.

## 2012-10-07 NOTE — Progress Notes (Signed)
[facility] [phone]   [phone]  OFFICE PROGRESS NOTE  Marga Melnick, MD [phone] [address] [facility] [address] 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   PRIOR THERAPY:  #1 status post 3 cycles of neoadjuvant chemotherapy with cisplatin and docetaxel. Last dose was given 12/20/2004 was significant response to the treatment.  #2 status post left pneumonectomy under the care of Dr. Edwyna Shell on 02/21/2005 and the final pathology was consistent with a stage II B. (T2, N1, MX) non-small cell lung cancer.  #3 status post 3 cycles of adjuvant chemotherapy was carboplatin and paclitaxel. Last dose was given 05/20/2005.   CURRENT THERAPY: Observation.  INTERVAL HISTORY: [name] [name] 78 y.o. male returns to the clinic today for annual follow up visit. The patient is feeling fine today with no specific complaints except for anxiety and hypertension. His blood pressure is always high when he come to the visit because of concern about his scan results. The patient mentions that at home his blood pressure is usually within the acceptable range. He was seen recently by his primary care physician and there was no concern about his hypertension. He denied having any significant weight loss or night sweats. The patient denied having any chest pain, shortness breath, cough or hemoptysis. He had repeat CT scan of the chest performed recently and he is here for evaluation and discussion of his scan results.  MEDICAL HISTORY: Past Medical History  Diagnosis Date  . BRADYCARDIA   . CAD   . DYSLIPIDEMIA   . HYPERTENSION   . POSTSURG PERCUT TRANSLUMINAL COR ANGPLSTY STS     LAD 50%, mid RCA 90% treated with BMS 2008  . SLEEP DISORDER   . Squamous cell lung cancer 2006    ALLERGIES:   has No Known Allergies.  MEDICATIONS:  Current Outpatient Prescriptions  Medication Sig Dispense Refill  . ALPRAZolam (XANAX) 0.25 MG tablet Take 1 tablet every 8-12 hours as needed for anxiety or sleep.  30 tablet  0  . aspirin 325 MG tablet Take 325 mg by mouth daily.        . clonazePAM (KLONOPIN) 0.5 MG tablet 1/2 qhs prn only  30 tablet  0  . lisinopril (PRINIVIL,ZESTRIL) 40 MG tablet TAKE 1 TABLET DAILY  90 tablet  2  . pravastatin (PRAVACHOL) 80 MG tablet TAKE 1 TABLET AT BEDTIME  90 tablet  2   No current facility-administered medications for this visit.    SURGICAL HISTORY:  Past Surgical History  Procedure Laterality Date  . Pneumonectomy      chemotherapy in 2006  . Cataract extraction, bilateral  2013  . Coronary artery stent  2008    REVIEW OF SYSTEMS:  A comprehensive review of systems was negative except for: Behavioral/Psych: positive for anxiety   PHYSICAL EXAMINATION: General appearance: alert, cooperative and no distress Head: Normocephalic, without obvious abnormality, atraumatic Neck: no adenopathy Lymph nodes: Cervical, supraclavicular, and axillary nodes normal. Resp: clear to auscultation on the right with absent breath sounds and dullness to percussion on the left. Cardio: regular rate and rhythm, S1, S2 normal, no murmur, click, rub or gallop GI: soft, non-tender; bowel sounds normal; no masses,  no organomegaly Extremities: extremities normal, atraumatic, no cyanosis or edema  ECOG PERFORMANCE STATUS: 1 - Symptomatic but  completely ambulatory  Blood pressure 204/85, pulse 67, temperature 97 F (36.1 C), temperature source Oral, resp. rate 20, height 5\' 7"  (1.702 m), weight 168 lb 11.2 oz (76.522 kg).  LABORATORY DATA: Lab Results  Component Value Date   WBC 7.2 10/05/2012   HGB 15.0 10/05/2012   HCT 43.9 10/05/2012   MCV 95.3 10/05/2012   PLT 271 10/05/2012      Chemistry      Component Value Date/Time   NA 136 10/05/2012 0751   NA 125*  05/14/2012 1216   NA 130 10/07/2010 0803   K 4.2 10/05/2012 0751   K 4.0 05/14/2012 1216   K 4.6 10/07/2010 0803   CL 89* 05/14/2012 1216   CL 87* 10/07/2010 0803   CO2 23 10/05/2012 0751   CO2 24 05/14/2012 1216   CO2 27 10/07/2010 0803   BUN 16.7 10/05/2012 0751   BUN 18 05/14/2012 1216   BUN 14 10/07/2010 0803   CREATININE 1.4* 10/05/2012 0751   CREATININE 1.7* 05/14/2012 1216   CREATININE 1.2 10/07/2010 0803      Component Value Date/Time   CALCIUM 9.5 10/05/2012 0751   CALCIUM 9.3 05/14/2012 1216   CALCIUM 9.2 10/07/2010 0803   ALKPHOS 47 10/05/2012 0751   ALKPHOS 40 10/03/2011 0720   ALKPHOS 47 10/07/2010 0803   AST 18 10/05/2012 0751   AST 18 10/03/2011 0720   AST 25 10/07/2010 0803   ALT 11 10/05/2012 0751   ALT 9 10/03/2011 0720   ALT 17 10/07/2010 0803   BILITOT 0.60 10/05/2012 0751   BILITOT 0.5 10/03/2011 0720   BILITOT 0.90 10/07/2010 0803       RADIOGRAPHIC STUDIES: Ct Chest Wo Contrast  10/05/2012   *RADIOLOGY REPORT*  Clinical Data: Lung carcinoma.  Restaging.  Chemotherapy complete. No current complaints.  CT CHEST WITHOUT CONTRAST  Technique:  Multidetector CT imaging of the chest was performed following the standard protocol without IV contrast.  Comparison: 10/03/2011  Findings: Postsurgical changes from the left pneumonectomy are stable including resection of the left sixth rib.  The mediastinum has shifted to the left.  There is minimal loculated pleural fluid that persists along the posterior lateral mid to lower hemithorax.  The right lung is hyperexpanded.  Three small calcified granulomata are noted in the posterior right upper lobe.  A larger calcified granuloma is noted in the right lower lobe adjacent to the oblique fissure.  There are a few minor areas of reticular scarring.  A tiny nodular density is noted along the oblique fissure laterally in the right lower lobe.  All of these findings are stable.  There is no infiltrate or evidence of pulmonary edema.  No right pleural  effusion is seen.  The heart is borderline enlarged with a left atrial predominance. There is a trace amount pericardial fluid.  Dense coronary artery calcifications are noted.  No mediastinal or hilar masses are noted and there are no pathologically enlarged lymph nodes.  No neck base masses or adenopathy are seen.  There is no axillary adenopathy.  Below the diaphragm, there is a 3 cm cyst arising from the posterior margin of the left kidney, minimally larger than it was previously.  There are no adrenal or liver masses.  There are no osteoblastic or osteolytic lesions.  IMPRESSION: No evidence of locally recurrent lung carcinoma.  No evidence of metastatic disease.  No evidence of new neoplastic disease.  Findings are essentially stable from the prior study.   Original Report Authenticated  By: Amie Portland, M.D.    ASSESSMENT AND PLAN: this is a very pleasant 78 years old white male with history of stage IIIA non-small cell lung cancer status post neoadjuvant chemotherapy followed by left pneumonectomy followed by 3 cycles of adjuvant chemotherapy and has been observation since April 2007 was no evidence for disease recurrence. I discussed the scan results with the patient today and recommended for him to continue on observation with repeat CT scan of the chest without contrast in one year. For the hypertension, I gave the patient clonidine 0.2 mg by mouth x1. Repeat blood pressure measurement showed improvement in his hypertension. I recommended for the patient to contact his primary care physician for reevaluation and management of his hypertension. He was advised to call immediately if he has any concerning symptoms in the interval.  The patient voices understanding of current disease status and treatment options and is in agreement with the current care plan.  All questions were answered. The patient knows to call the clinic with any problems, questions or concerns. We can certainly see the patient  much sooner if necessary.

## 2012-10-08 ENCOUNTER — Telehealth: Payer: Self-pay | Admitting: Cardiology

## 2012-10-08 ENCOUNTER — Ambulatory Visit: Payer: Medicare Other | Admitting: Family Medicine

## 2012-10-08 ENCOUNTER — Encounter: Payer: Self-pay | Admitting: Cardiology

## 2012-10-08 ENCOUNTER — Ambulatory Visit (INDEPENDENT_AMBULATORY_CARE_PROVIDER_SITE_OTHER): Payer: Medicare Other | Admitting: Cardiology

## 2012-10-08 VITALS — BP 190/89 | HR 60 | Ht 67.0 in | Wt 168.8 lb

## 2012-10-08 DIAGNOSIS — I498 Other specified cardiac arrhythmias: Secondary | ICD-10-CM | POA: Diagnosis not present

## 2012-10-08 DIAGNOSIS — I2510 Atherosclerotic heart disease of native coronary artery without angina pectoris: Secondary | ICD-10-CM | POA: Diagnosis not present

## 2012-10-08 DIAGNOSIS — I10 Essential (primary) hypertension: Secondary | ICD-10-CM

## 2012-10-08 MED ORDER — CLONIDINE HCL 0.1 MG PO TABS: 0.1000 mg | ORAL_TABLET | ORAL | Status: DC | PRN

## 2012-10-08 MED ORDER — AMLODIPINE BESYLATE 5 MG PO TABS: 5.0000 mg | ORAL_TABLET | Freq: Every day | ORAL | Status: DC

## 2012-10-08 NOTE — Telephone Encounter (Signed)
New problem   Pt's daughter stated pt's bp is extremely high it has been... 217/93.... 205/95...197/86...111/91 pt would like to be seen today. Please call pt's daughter.

## 2012-10-08 NOTE — Progress Notes (Signed)
   HPI The patient presents for evaluation of high blood pressure. He was seen yesterday in the oncology clinic and noted to have blood pressure in the 190s. He took his blood pressure multiple times last night and it was in the 190s fairly consistently. He says he's been feeling great.  He does a lot of your to work. The patient denies any new symptoms such as chest discomfort, neck or arm discomfort. There has been no new shortness of breath, PND or orthopnea. There have been no reported palpitations, presyncope or syncope.  No Known Allergies  Current Outpatient Prescriptions  Medication Sig Dispense Refill  . ALPRAZolam (XANAX) 0.25 MG tablet Take 1 tablet every 8-12 hours as needed for anxiety or sleep.  30 tablet  0  . aspirin 325 MG tablet Take 325 mg by mouth daily.        Marland Kitchen lisinopril (PRINIVIL,ZESTRIL) 40 MG tablet TAKE 1 TABLET DAILY  90 tablet  2  . pravastatin (PRAVACHOL) 80 MG tablet TAKE 1 TABLET AT BEDTIME  90 tablet  2   No current facility-administered medications for this visit.    Past Medical History  Diagnosis Date  . BRADYCARDIA   . CAD   . DYSLIPIDEMIA   . HYPERTENSION   . POSTSURG PERCUT TRANSLUMINAL COR ANGPLSTY STS     LAD 50%, mid RCA 90% treated with BMS 2008  . SLEEP DISORDER   . Squamous cell lung cancer 2006    Past Surgical History  Procedure Laterality Date  . Pneumonectomy      chemotherapy in 2006  . Cataract extraction, bilateral  2013  . Coronary artery stent  2008    ROS:  As stated in the HPI and negative for all other systems.  PHYSICAL EXAM BP 190/89  Pulse 60  Ht 5\' 7"  (1.702 m)  Wt 168 lb 12.8 oz (76.567 kg)  BMI 26.43 kg/m2 GENERAL:  Well appearing HEENT:  Pupils equal round and reactive, fundi not visualized, oral mucosa unremarkable, dentures NECK:  No jugular venous distention, waveform within normal limits, carotid upstroke brisk and symmetric, no bruits, no thyromegaly LUNGS:  Clear to auscultation bilaterally BACK:  No  CVA tenderness CHEST:  Unremarkable HEART:  PMI not displaced or sustained,S1 and S2 within normal limits, no S3, no S4, no clicks, no rubs, no murmurs ABD:  Flat, positive bowel sounds normal in frequency in pitch, no bruits, no rebound, no guarding, no midline pulsatile mass, no hepatomegaly, no splenomegaly EXT:  2 plus pulses throughout, no edema, no cyanosis no clubbing  EKG:  Sinus rhythm, rate 60, axis within normal limits, intervals within normal limits, no acute ST-T wave changes.  10/08/2012  ASSESSMENT AND PLAN  HTN:  I have instructed him to start Norvasc 5 mg daily. In the meantime if he has a systolic above 190 he is to relax and maybe take 1 of his anxiolytics and repeat the reading in about 30 minutes. It is still at this level he can take 0.1 clonidine which we will give him. He will keep a blood pressure diary and let us know he is trending down with the addition of Norvasc.  CAD:  The patient has no new sypmtoms.  No further cardiovascular testing is indicated.  We will continue with aggressive risk reduction and meds as listed.

## 2012-10-08 NOTE — Telephone Encounter (Signed)
Pt coming in today for evaluation of HTN at 1:30

## 2012-10-08 NOTE — Patient Instructions (Addendum)
Please start Amlodipine 5 mg a day. You may take Clonidine 0.1 mg as needed for a blood pressure above 190 systolically.  Keep a dairy of your blood pressures.  Take it once a day at different times of the day and call if it continues to stay elevated.  Follow up as scheduled.

## 2012-10-19 ENCOUNTER — Encounter: Payer: Self-pay | Admitting: Medical Oncology

## 2012-10-19 ENCOUNTER — Telehealth: Payer: Self-pay | Admitting: Medical Oncology

## 2012-10-19 NOTE — Telephone Encounter (Signed)
Rosey Bath called with pt med list.

## 2012-10-22 ENCOUNTER — Encounter: Payer: Self-pay | Admitting: Cardiology

## 2012-10-22 ENCOUNTER — Ambulatory Visit (INDEPENDENT_AMBULATORY_CARE_PROVIDER_SITE_OTHER): Payer: Medicare Other | Admitting: Cardiology

## 2012-10-22 VITALS — BP 154/69 | HR 61 | Ht 67.0 in | Wt 167.4 lb

## 2012-10-22 DIAGNOSIS — I498 Other specified cardiac arrhythmias: Secondary | ICD-10-CM | POA: Diagnosis not present

## 2012-10-22 DIAGNOSIS — I2510 Atherosclerotic heart disease of native coronary artery without angina pectoris: Secondary | ICD-10-CM

## 2012-10-22 DIAGNOSIS — I10 Essential (primary) hypertension: Secondary | ICD-10-CM | POA: Diagnosis not present

## 2012-10-22 NOTE — Progress Notes (Signed)
   HPI The patient presents for evaluation of high blood pressure. Since he was last seen he's had no new problems. I started him on Norvasc. He tolerated this and his blood pressures are now down. I reviewed her blood pressure diary. He's had no presyncope or syncope. The patient denies any new symptoms such as chest discomfort, neck or arm discomfort. There has been no new shortness of breath, PND or orthopnea. There have been no reported palpitations, presyncope or syncope.   No Known Allergies  Current Outpatient Prescriptions  Medication Sig Dispense Refill  . ALPRAZolam (XANAX) 0.25 MG tablet Take 1 tablet every 8-12 hours as needed for anxiety or sleep.  30 tablet  0  . amLODipine (NORVASC) 5 MG tablet Take 1 tablet (5 mg total) by mouth daily.  30 tablet  11  . aspirin 325 MG tablet Take 325 mg by mouth daily.        . cloNIDine (CATAPRES) 0.1 MG tablet Take 1 tablet (0.1 mg total) by mouth as needed.  30 tablet  3  . lisinopril (PRINIVIL,ZESTRIL) 40 MG tablet TAKE 1 TABLET DAILY  90 tablet  2  . pravastatin (PRAVACHOL) 80 MG tablet TAKE 1 TABLET AT BEDTIME  90 tablet  2   No current facility-administered medications for this visit.    Past Medical History  Diagnosis Date  . BRADYCARDIA   . CAD   . DYSLIPIDEMIA   . HYPERTENSION   . POSTSURG PERCUT TRANSLUMINAL COR ANGPLSTY STS     LAD 50%, mid RCA 90% treated with BMS 2008  . SLEEP DISORDER   . Squamous cell lung cancer 2006    Past Surgical History  Procedure Laterality Date  . Pneumonectomy      chemotherapy in 2006  . Cataract extraction, bilateral  2013  . Coronary artery stent  2008    ROS:  Insomnia.  Otherwise as stated in the HPI and negative for all other systems.  PHYSICAL EXAM BP 154/69  Pulse 61  Ht 5\' 7"  (1.702 m)  Wt 167 lb 6.4 oz (75.932 kg)  BMI 26.21 kg/m2 GENERAL:  Well appearing NECK:  No jugular venous distention, waveform within normal limits, carotid upstroke brisk and symmetric, no bruits,  no thyromegaly LUNGS:  Clear to auscultation bilaterally HEART:  PMI not displaced or sustained,S1 and S2 within normal limits, no S3, no S4, no clicks, no rubs, no murmurs ABD:  Flat, positive bowel sounds normal in frequency in pitch, no bruits, no rebound, no guarding, no midline pulsatile mass, no hepatomegaly, no splenomegaly EXT:  2 plus pulses throughout, no edema, no cyanosis no clubbing  EKG:  Sinus rhythm, rate 61, axis within normal limits, intervals within normal limits, no acute ST-T wave changes.  10/22/2012  ASSESSMENT AND PLAN  HTN:  His blood pressure is well-controlled on the meds as listed. I reviewed his blood pressure diary. No change in therapy is indicated.  CAD:  The patient has no new sypmtoms.  No further cardiovascular testing is indicated.  We will continue with aggressive risk reduction and meds as listed.

## 2012-10-22 NOTE — Patient Instructions (Addendum)
The current medical regimen is effective;  continue present plan and medications.  Follow up in 1 year with Dr Hochrein.  You will receive a letter in the mail 2 months before you are due.  Please call us when you receive this letter to schedule your follow up appointment.  

## 2012-11-15 ENCOUNTER — Telehealth: Payer: Self-pay | Admitting: Cardiology

## 2012-11-15 MED ORDER — AMLODIPINE BESYLATE 5 MG PO TABS: 5.0000 mg | ORAL_TABLET | Freq: Every day | ORAL | Status: DC

## 2012-11-15 NOTE — Telephone Encounter (Signed)
New Problem:  Pt's daughter is calling in to check on the status new Rx. Pt's daughter states at last appt she was told [facility]. Pt's daughter does not recall the name of the new medication but she states it is for her father's BP. Please advise

## 2012-11-15 NOTE — Telephone Encounter (Signed)
Returned call to patient's daughter Rosey Bath she stated her father needed a 90 day refill on [facility]lodipine [facility].Refill sent to pharmacy.

## 2012-11-19 ENCOUNTER — Other Ambulatory Visit: Payer: Self-pay | Admitting: Dermatology

## 2012-11-19 DIAGNOSIS — L821 Other seborrheic keratosis: Secondary | ICD-10-CM | POA: Diagnosis not present

## 2012-11-19 DIAGNOSIS — D485 Neoplasm of uncertain behavior of skin: Secondary | ICD-10-CM | POA: Diagnosis not present

## 2012-11-19 DIAGNOSIS — D239 Other benign neoplasm of skin, unspecified: Secondary | ICD-10-CM | POA: Diagnosis not present

## 2012-11-19 DIAGNOSIS — L570 Actinic keratosis: Secondary | ICD-10-CM | POA: Diagnosis not present

## 2013-05-13 DIAGNOSIS — D239 Other benign neoplasm of skin, unspecified: Secondary | ICD-10-CM | POA: Diagnosis not present

## 2013-05-13 DIAGNOSIS — L570 Actinic keratosis: Secondary | ICD-10-CM | POA: Diagnosis not present

## 2013-08-29 ENCOUNTER — Other Ambulatory Visit: Payer: Self-pay | Admitting: Cardiology

## 2013-10-04 ENCOUNTER — Other Ambulatory Visit (HOSPITAL_BASED_OUTPATIENT_CLINIC_OR_DEPARTMENT_OTHER): Payer: Medicare Other

## 2013-10-04 ENCOUNTER — Ambulatory Visit (HOSPITAL_COMMUNITY)
Admission: RE | Admit: 2013-10-04 | Discharge: 2013-10-04 | Disposition: A | Discharge status: Home / Self Care | Payer: Medicare Other | Source: Ambulatory Visit | Attending: Internal Medicine | Admitting: Internal Medicine

## 2013-10-04 ENCOUNTER — Encounter (HOSPITAL_COMMUNITY): Payer: Self-pay

## 2013-10-04 DIAGNOSIS — C3490 Malignant neoplasm of unspecified part of unspecified bronchus or lung: Secondary | ICD-10-CM | POA: Diagnosis not present

## 2013-10-04 DIAGNOSIS — C3410 Malignant neoplasm of upper lobe, unspecified bronchus or lung: Secondary | ICD-10-CM | POA: Diagnosis not present

## 2013-10-04 DIAGNOSIS — I10 Essential (primary) hypertension: Secondary | ICD-10-CM

## 2013-10-04 LAB — COMPREHENSIVE METABOLIC PANEL (CC13)
ALT: 10 U/L (ref 0–55)
ANION GAP: 10 meq/L (ref 3–11)
AST: 25 U/L (ref 5–34)
Albumin: 3.9 g/dL (ref 3.5–5.0)
Alkaline Phosphatase: 43 U/L (ref 40–150)
BILIRUBIN TOTAL: 0.55 mg/dL (ref 0.20–1.20)
BUN: 18.1 mg/dL (ref 7.0–26.0)
CO2: 24 meq/L (ref 22–29)
Calcium: 10 mg/dL (ref 8.4–10.4)
Chloride: 103 mEq/L (ref 98–109)
Creatinine: 1.5 mg/dL — ABNORMAL HIGH (ref 0.7–1.3)
GLUCOSE: 98 mg/dL (ref 70–140)
Potassium: 5.1 mEq/L (ref 3.5–5.1)
Sodium: 136 mEq/L (ref 136–145)
Total Protein: 7.8 g/dL (ref 6.4–8.3)

## 2013-10-04 LAB — CBC WITH DIFFERENTIAL/PLATELET
BASO%: 0.6 % (ref 0.0–2.0)
Basophils Absolute: 0 10*3/uL (ref 0.0–0.1)
EOS ABS: 0.2 10*3/uL (ref 0.0–0.5)
EOS%: 2.5 % (ref 0.0–7.0)
HEMATOCRIT: 44.9 % (ref 38.4–49.9)
HGB: 14.9 g/dL (ref 13.0–17.1)
LYMPH%: 21.7 % (ref 14.0–49.0)
MCH: 31.9 pg (ref 27.2–33.4)
MCHC: 33.1 g/dL (ref 32.0–36.0)
MCV: 96.6 fL (ref 79.3–98.0)
MONO#: 1 10*3/uL — AB (ref 0.1–0.9)
MONO%: 13.3 % (ref 0.0–14.0)
NEUT#: 4.4 10*3/uL (ref 1.5–6.5)
NEUT%: 61.9 % (ref 39.0–75.0)
PLATELETS: 258 10*3/uL (ref 140–400)
RBC: 4.65 10*6/uL (ref 4.20–5.82)
RDW: 14 % (ref 11.0–14.6)
WBC: 7.2 10*3/uL (ref 4.0–10.3)
lymph#: 1.6 10*3/uL (ref 0.9–3.3)

## 2013-10-05 ENCOUNTER — Encounter: Payer: Self-pay | Admitting: Internal Medicine

## 2013-10-05 ENCOUNTER — Ambulatory Visit (HOSPITAL_BASED_OUTPATIENT_CLINIC_OR_DEPARTMENT_OTHER): Payer: Medicare Other | Admitting: Internal Medicine

## 2013-10-05 ENCOUNTER — Telehealth: Payer: Self-pay | Admitting: Internal Medicine

## 2013-10-05 VITALS — BP 178/63 | HR 69 | Temp 97.7°F | Resp 18 | Ht 67.0 in | Wt 174.6 lb

## 2013-10-05 DIAGNOSIS — Z85118 Personal history of other malignant neoplasm of bronchus and lung: Secondary | ICD-10-CM | POA: Diagnosis not present

## 2013-10-05 DIAGNOSIS — C3490 Malignant neoplasm of unspecified part of unspecified bronchus or lung: Secondary | ICD-10-CM

## 2013-10-05 NOTE — Telephone Encounter (Signed)
Pt confirmed labs/ov per 08/19 POF, gave pt AVS..Marland KitchenKJ

## 2013-10-05 NOTE — Progress Notes (Signed)
[place] [phone]   [phone]  OFFICE PROGRESS NOTE  [name], MD 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   PRIOR THERAPY:  #1 status post 3 cycles of neoadjuvant chemotherapy with cisplatin and docetaxel. Last dose was given 12/20/2004 was significant response to the treatment.  #2 status post left pneumonectomy under the care of Dr. Arlyce Dice on 02/21/2005 and the final pathology was consistent with a stage II B. (T2, N1, MX) non-small cell lung cancer.  #3 status post 3 cycles of adjuvant chemotherapy was carboplatin and paclitaxel. Last dose was given 05/20/2005.   CURRENT THERAPY: Observation.  INTERVAL HISTORY: [name] [name] 79 y.o. male returns to the clinic today for annual follow up visit. The patient is feeling fine today with no specific complaints. He is currently on 3 blood pressure medication including Norvasc, lisinopril and clonidine an as-needed basis. He denied having any significant weight loss or night sweats. The patient denied having any chest pain, shortness of breath, cough or hemoptysis. He had repeat CT scan of the chest performed recently and he is here for evaluation and discussion of his scan results.  MEDICAL HISTORY: Past Medical History  Diagnosis Date  . BRADYCARDIA   . CAD   . DYSLIPIDEMIA   . HYPERTENSION   . POSTSURG PERCUT TRANSLUMINAL COR ANGPLSTY STS     LAD 50%, mid RCA 90% treated with BMS 2008  . SLEEP DISORDER   . Squamous cell lung cancer 2006    ALLERGIES:  has No Known Allergies.  MEDICATIONS:  Current Outpatient Prescriptions  Medication Sig Dispense Refill  . ALPRAZolam (XANAX) 0.25 MG tablet Take 1 tablet every 8-12 hours as needed for anxiety or sleep.  30 tablet  0  . amLODipine  (NORVASC) 5 MG tablet Take 1 tablet (5 mg total) by mouth daily.  90 tablet  3  . aspirin 325 MG tablet Take 325 mg by mouth daily.        . cloNIDine (CATAPRES) 0.1 MG tablet Take 1 tablet (0.1 mg total) by mouth as needed.  30 tablet  3  . lisinopril (PRINIVIL,ZESTRIL) 40 MG tablet TAKE 1 TABLET DAILY  90 tablet  0  . pravastatin (PRAVACHOL) 80 MG tablet TAKE 1 TABLET AT BEDTIME  90 tablet  0   No current facility-administered medications for this visit.    SURGICAL HISTORY:  Past Surgical History  Procedure Laterality Date  . Pneumonectomy      chemotherapy in 2006  . Cataract extraction, bilateral  2013  . Coronary artery stent  2008    REVIEW OF SYSTEMS:  A comprehensive review of systems was negative.   PHYSICAL EXAMINATION: General appearance: alert, cooperative and no distress Head: Normocephalic, without obvious abnormality, atraumatic Neck: no adenopathy Lymph nodes: Cervical, supraclavicular, and axillary nodes normal. Resp: clear to auscultation on the right with absent breath sounds and dullness to percussion on the left. Cardio: regular rate and rhythm, S1, S2 normal, no murmur, click, rub or gallop GI: soft, non-tender; bowel sounds normal; no masses,  no organomegaly Extremities: extremities normal, atraumatic, no cyanosis or edema  ECOG PERFORMANCE STATUS: 1 - Symptomatic but completely ambulatory  Blood pressure 178/63, pulse 69, temperature 97.7 F (36.5 C), temperature source Oral, resp. rate 18, height 5\' 7"  (  1.702 m), weight 174 lb 9.6 oz (79.198 kg), SpO2 100.00%.  LABORATORY DATA: Lab Results  Component Value Date   WBC 7.2 10/04/2013   HGB 14.9 10/04/2013   HCT 44.9 10/04/2013   MCV 96.6 10/04/2013   PLT 258 10/04/2013      Chemistry      Component Value Date/Time   NA 136 10/04/2013 0814   NA 125* 05/14/2012 1216   NA 130 10/07/2010 0803   K 5.1 10/04/2013 0814   K 4.0 05/14/2012 1216   K 4.6 10/07/2010 0803   CL 89* 05/14/2012 1216   CL 87*  10/07/2010 0803   CO2 24 10/04/2013 0814   CO2 24 05/14/2012 1216   CO2 27 10/07/2010 0803   BUN 18.1 10/04/2013 0814   BUN 18 05/14/2012 1216   BUN 14 10/07/2010 0803   CREATININE 1.5* 10/04/2013 0814   CREATININE 1.7* 05/14/2012 1216   CREATININE 1.2 10/07/2010 0803      Component Value Date/Time   CALCIUM 10.0 10/04/2013 0814   CALCIUM 9.3 05/14/2012 1216   CALCIUM 9.2 10/07/2010 0803   ALKPHOS 43 10/04/2013 0814   ALKPHOS 40 10/03/2011 0720   ALKPHOS 47 10/07/2010 0803   AST 25 10/04/2013 0814   AST 18 10/03/2011 0720   AST 25 10/07/2010 0803   ALT 10 10/04/2013 0814   ALT 9 10/03/2011 0720   ALT 17 10/07/2010 0803   BILITOT 0.55 10/04/2013 0814   BILITOT 0.5 10/03/2011 0720   BILITOT 0.90 10/07/2010 0803       RADIOGRAPHIC STUDIES: Ct Chest Wo Contrast  10/04/2013   CLINICAL DATA:  Restaging lung cancer diagnosed in 2006 status post surgery and chemotherapy.  EXAM: CT CHEST WITHOUT CONTRAST  TECHNIQUE: Multidetector CT imaging of the chest was performed following the standard protocol without IV contrast.  COMPARISON:  Chest CTs dated 10/05/2012 and 10/03/2011.  FINDINGS: Mediastinum: As evaluated in the noncontrast state, the mediastinum appears stable with small paratracheal and precarinal lymph nodes. No enlarged lymph nodes are identified. There is stable mediastinal shift to the left subsequent to prior left pneumonectomy. The thyroid gland and esophagus appear normal. The heart size is normal. There is stable diffuse atherosclerosis of the aorta, great vessels and coronary arteries.  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 No significant right pleural effusion is present. Mild pericardial thickening posteriorly is stable.The right lung has a stable appearance with small calcified and noncalcified nodules. There are no suspicious pulmonary findings.  Upper abdomen: Stable appearance. There is a small calcified hepatic  granuloma. No adrenal mass.  Musculoskeletal/Chest wall: Stable thoracotomy changes on the left. No suspicious chest wall or osseous lesions.  IMPRESSION: Stable postoperative chest CT. No evidence of local recurrence or metastatic disease.   Electronically Signed   By: Camie Patience M.D.   On: 10/04/2013 09:40   ASSESSMENT AND PLAN: this is a very pleasant 79 years old white male with history of stage IIIA non-small cell lung cancer status post neoadjuvant chemotherapy followed by left pneumonectomy followed by 3 cycles of adjuvant chemotherapy and has been observation since April 2007 with no evidence for disease recurrence.  His recent CT scan of the chest showed no evidence for disease progression. I discussed the scan results with the patient today. I recommended for him to continue on observation with repeat CT scan of the chest in one year. He was advised to call immediately if he has any concerning symptoms  in the interval.  The patient voices understanding of current disease status and treatment options and is in agreement with the current care plan.  All questions were answered. The patient knows to call the clinic with any problems, questions or concerns. We can certainly see the patient much sooner if necessary.  Disclaimer: This note was dictated with voice recognition software. Similar sounding words can inadvertently be transcribed and may be missed upon review.

## 2013-12-02 ENCOUNTER — Ambulatory Visit (INDEPENDENT_AMBULATORY_CARE_PROVIDER_SITE_OTHER): Payer: Medicare Other | Admitting: Internal Medicine

## 2013-12-02 ENCOUNTER — Other Ambulatory Visit (INDEPENDENT_AMBULATORY_CARE_PROVIDER_SITE_OTHER): Payer: Medicare Other

## 2013-12-02 ENCOUNTER — Encounter: Payer: Self-pay | Admitting: Internal Medicine

## 2013-12-02 VITALS — BP 120/70 | HR 96 | Temp 98.1°F | Resp 13 | Wt 168.2 lb

## 2013-12-02 DIAGNOSIS — I491 Atrial premature depolarization: Secondary | ICD-10-CM

## 2013-12-02 DIAGNOSIS — Z23 Encounter for immunization: Secondary | ICD-10-CM

## 2013-12-02 DIAGNOSIS — R1013 Epigastric pain: Secondary | ICD-10-CM

## 2013-12-02 DIAGNOSIS — I499 Cardiac arrhythmia, unspecified: Secondary | ICD-10-CM

## 2013-12-02 LAB — HEPATIC FUNCTION PANEL
ALBUMIN: 3.7 g/dL (ref 3.5–5.2)
ALK PHOS: 45 U/L (ref 39–117)
ALT: 16 U/L (ref 0–53)
AST: 32 U/L (ref 0–37)
Bilirubin, Direct: 0.1 mg/dL (ref 0.0–0.3)
TOTAL PROTEIN: 8.3 g/dL (ref 6.0–8.3)
Total Bilirubin: 0.6 mg/dL (ref 0.2–1.2)

## 2013-12-02 LAB — CBC WITH DIFFERENTIAL/PLATELET
BASOS PCT: 0.1 % (ref 0.0–3.0)
Basophils Absolute: 0 10*3/uL (ref 0.0–0.1)
Eosinophils Absolute: 0 10*3/uL (ref 0.0–0.7)
Eosinophils Relative: 0.1 % (ref 0.0–5.0)
HEMATOCRIT: 49.7 % (ref 39.0–52.0)
HEMOGLOBIN: 16.7 g/dL (ref 13.0–17.0)
Lymphocytes Relative: 7 % — ABNORMAL LOW (ref 12.0–46.0)
Lymphs Abs: 0.4 10*3/uL — ABNORMAL LOW (ref 0.7–4.0)
MCHC: 33.6 g/dL (ref 30.0–36.0)
MCV: 93.8 fl (ref 78.0–100.0)
Monocytes Absolute: 0.8 10*3/uL (ref 0.1–1.0)
Monocytes Relative: 14.8 % — ABNORMAL HIGH (ref 3.0–12.0)
NEUTROS ABS: 4.1 10*3/uL (ref 1.4–7.7)
Neutrophils Relative %: 78 % — ABNORMAL HIGH (ref 43.0–77.0)
Platelets: 166 10*3/uL (ref 150.0–400.0)
RBC: 5.31 Mil/uL (ref 4.22–5.81)
RDW: 13.3 % (ref 11.5–15.5)
WBC: 5.3 10*3/uL (ref 4.0–10.5)

## 2013-12-02 LAB — AMYLASE: Amylase: 70 U/L (ref 27–131)

## 2013-12-02 LAB — LIPASE: Lipase: 40 U/L (ref 11.0–59.0)

## 2013-12-02 MED ORDER — RANITIDINE HCL 150 MG PO TABS: 150.0000 mg | ORAL_TABLET | Freq: Two times a day (BID) | ORAL | Status: DC

## 2013-12-02 NOTE — Progress Notes (Signed)
   Subjective:    Patient ID: [name] [name], male    DOB: Jul 01, 1934, 79 y.o.   MRN: [id number]  HPI   His had mid abdominal constant pain for 3 days. It has not progressed or abated.  This was associated with anorexia and chills. Pepto-Bismol was of benefit. After taking this he did notice that the stools were black.  There is also increased gas production  He drinks 2-3 cups of coffee a day.He is on a full dose aspirin.  He has no history of ulcers. His last colonoscopy was over a decade ago.    Review of Systems    He specifically denies nausea, vomiting, constipation, diarrhea, dysphagia, dyspepsia, hematemesis, or fever.  He has no genitourinary symptoms of dysuria, pyuria, or hematuria.      Objective:   Physical Exam   Positive or pertinent findings include: Pattern alopecia is present He has an upper plate and lower partial. He is missing teeth and dental care is needed. Thyroid is small. Nasal septum is deviated to the left Heart rhythm is irregular as is the rate. He has DIP osteoarthritic changes. Prostate is mildly asymmetric but not enlarged. There is no nodularity or induration. Stool is soft and black and Hemoccult negative. Slight tenting present.  General appearance :adequately nourished; in no distress. Eyes: No conjunctival inflammation or scleral icterus is present. Oral exam: Lips and gums are healthy appearing.There is no oropharyngeal erythema or exudate noted.  Heart:  No murmur, click, rub or other extra sounds   Lungs:Chest clear to auscultation; no wheezes, rhonchi,rales ,or rubs present.No increased work of breathing.  Abdomen: bowel sounds normal, soft and non-tender without masses, organomegaly or hernias noted.  No guarding or rebound.  Vascular : all pulses equal ; no bruits present. Skin:Warm & dry.  Intact without suspicious lesions or rashes ; no jaundice  Lymphatic: No lymphadenopathy is noted about the head, neck, axilla, or inguinal  areas.           Assessment & Plan:   #1 abdominal pain, probably gastritis  #2 irregular rate and rhythm; rule out atrial fibrillation. There is no history of this in the chart  Plan: See orders and recommendations

## 2013-12-02 NOTE — Progress Notes (Signed)
Pre visit review using our clinic review tool, if applicable. No additional management support is needed unless otherwise documented below in the visit note. 

## 2013-12-02 NOTE — Patient Instructions (Addendum)
Reflux of gastric acid may be asymptomatic as this may occur mainly during sleep.The triggers for reflux  include stress; the "aspirin family" ; alcohol; peppermint; and caffeine (coffee, tea, cola, and chocolate). The aspirin family would include aspirin and the nonsteroidal agents such as ibuprofen &  Naproxen. Tylenol would not cause reflux. If having symptoms ; food & drink should be avoided for @ least 2 hours before going to bed.   Your next office appointment will be determined based upon review of your pending labs . Those instructions will be transmitted to you through My Chart  OR  by mail;whichever process is your choice to receive results & recommendations . Followup as needed for your acute issue. Please report any significant change in your symptoms.  To prevent palpitations or premature beats, avoid stimulants such as decongestants, diet pills, nicotine, or caffeine (coffee, tea, cola, or chocolate) to excess.

## 2013-12-10 ENCOUNTER — Other Ambulatory Visit: Payer: Self-pay | Admitting: Cardiology

## 2014-01-02 ENCOUNTER — Ambulatory Visit (INDEPENDENT_AMBULATORY_CARE_PROVIDER_SITE_OTHER): Payer: Medicare Other | Admitting: Cardiology

## 2014-01-02 ENCOUNTER — Encounter: Payer: Self-pay | Admitting: Cardiology

## 2014-01-02 VITALS — BP 158/90 | HR 69 | Ht 67.0 in | Wt 169.0 lb

## 2014-01-02 DIAGNOSIS — I2510 Atherosclerotic heart disease of native coronary artery without angina pectoris: Secondary | ICD-10-CM | POA: Diagnosis not present

## 2014-01-02 LAB — LIPID PANEL
CHOLESTEROL: 219 mg/dL — AB (ref 0–200)
HDL: 91 mg/dL (ref >39)
LDL Cholesterol: 115 mg/dL — ABNORMAL HIGH (ref 0–99)
Total CHOL/HDL Ratio: 2.4 Ratio
Triglycerides: 65 mg/dL (ref <150)
VLDL: 13 mg/dL (ref 0–40)

## 2014-01-02 NOTE — Patient Instructions (Addendum)
Your physician recommends that you schedule a follow-up appointment in: one year with Dr. Percival Spanish  We are ordering a stress test   [ Treadmil  ]     We are ordering labs for you to get done

## 2014-01-02 NOTE — Progress Notes (Signed)
HPI The patient presents for evaluation of high blood pressure. Since he was last seen he's had no new problems. The patient denies any new symptoms such as chest discomfort, neck or arm discomfort. There has been no new shortness of breath, PND or orthopnea. There have been no reported palpitations, presyncope or syncope.  He doesn't exercise routinely but he stays active in his yard and shop. Of note he had some stomach upset. He was taken off of aspirin. He had this problem before while on aspirin and it got better. He does say some discomfort when we this time after he got off 81 mg aspirin. I did review his labs and he was not anemic. His daughter says it was a stool sample sent out but I don't see this.  No Known Allergies  Current Outpatient Prescriptions  Medication Sig Dispense Refill  . aspirin 325 MG tablet Take 325 mg by mouth daily.      Marland Kitchen lisinopril (PRINIVIL,ZESTRIL) 40 MG tablet TAKE 1 TABLET DAILY (PATIENT NEEDS TO CONTACT OFFICE TO SCHEDULE APPOINTMENT FOR FUTURE REFILLS, PHONE [phone]) 90 tablet 0  . pravastatin (PRAVACHOL) 80 MG tablet TAKE 1 TABLET AT BEDTIME (PATIENT NEEDS TO CONTACT OFFICE TO SCHEDULE APPOINTMENT FOR FUTURE REFILLS, PHONE [phone]) 90 tablet 0  . ranitidine (ZANTAC) 150 MG tablet Take 1 tablet (150 mg total) by mouth 2 (two) times daily. 60 tablet 2  . amLODipine (NORVASC) 5 MG tablet Take 1 tablet (5 mg total) by mouth daily. 90 tablet 3   No current facility-administered medications for this visit.    Past Medical History  Diagnosis Date  . BRADYCARDIA   . CAD   . DYSLIPIDEMIA   . HYPERTENSION   . POSTSURG PERCUT TRANSLUMINAL COR ANGPLSTY STS     LAD 50%, mid RCA 90% treated with BMS 2008  . SLEEP DISORDER   . Squamous cell lung cancer 2006    Past Surgical History  Procedure Laterality Date  . Pneumonectomy      chemotherapy in 2006  . Cataract extraction, bilateral  2013  . Coronary artery stent  2008    ROS:  As stated in  the HPI and negative for all other systems.  PHYSICAL EXAM BP 158/90 mmHg  Pulse 69  Ht 5\' 7"  (1.702 m)  Wt 169 lb (76.658 kg)  BMI 26.46 kg/m2 GENERAL:  Well appearing HEENT:  PERRL, upper dentures NECK:  No jugular venous distention, waveform within normal limits, carotid upstroke brisk and symmetric, no bruits, no thyromegaly LUNGS:  Clear to auscultation bilaterally HEART:  PMI not displaced or sustained,S1 and S2 within normal limits, no S3, no S4, no clicks, no rubs, no murmurs ABD:  Flat, positive bowel sounds normal in frequency in pitch, no bruits, no rebound, no guarding, no midline pulsatile mass, no hepatomegaly, no splenomegaly EXT:  2 plus pulses throughout, no edema, no cyanosis no clubbing SKIN:  No rashes no nodules NEURO:  Nonfocal, cranial nerves II through XII grossly intact, motor grossly intact  EKG:  Sinus rhythm, rate 69, axis within normal limits, intervals within normal limits, no acute ST-T wave changes.  01/02/2014  ASSESSMENT AND PLAN  HTN:  The blood pressure continues to be high but this seems to be intermittent. I have instructed the patient to record a blood pressure diary and recording this. This will be presented for my review and pending these results I will make further suggestions about changes in therapy for optimal blood pressure control.  CAD:  The patient has no new sypmtoms.  I will bring the patient back for a POET (Plain Old Exercise Test). This will allow me to screen for obstructive coronary disease, risk stratify and very importantly provide a prescription for exercise.  Of note he was taken off the aspirin and I would like him to restart his 1 mg. He gets stomach upset I would suggest a GI evaluation. He should either be on aspirin or Plavix.  DYSLIPIDEMIA:  I don't see a lipid profile since 2013.  I will check one.

## 2014-01-25 ENCOUNTER — Telehealth (HOSPITAL_COMMUNITY): Payer: Self-pay

## 2014-01-25 NOTE — Telephone Encounter (Signed)
Encounter complete. 

## 2014-01-27 ENCOUNTER — Ambulatory Visit (HOSPITAL_COMMUNITY)
Admission: RE | Admit: 2014-01-27 | Discharge: 2014-01-27 | Disposition: A | Discharge status: Home / Self Care | Payer: Medicare Other | Source: Ambulatory Visit | Attending: Cardiology | Admitting: Cardiology

## 2014-01-27 DIAGNOSIS — I2510 Atherosclerotic heart disease of native coronary artery without angina pectoris: Secondary | ICD-10-CM

## 2014-01-27 DIAGNOSIS — I10 Essential (primary) hypertension: Secondary | ICD-10-CM | POA: Insufficient documentation

## 2014-01-27 DIAGNOSIS — R0600 Dyspnea, unspecified: Secondary | ICD-10-CM | POA: Insufficient documentation

## 2014-01-27 NOTE — Procedures (Signed)
Exercise Treadmill Test     Test  Exercise Tolerance Test Ordering MD: Marijo File, MD  Interpreting MD:   Unique Test No: 1 Treadmill:  1  Indication for ETT: CAD, HTN  Contraindication to ETT: No   Stress Modality: exercise - treadmill  Cardiac Imaging Performed: non   Protocol: standard Bruce - maximal  Max BP:  209/97  Max MPHR (bpm):  143 85% MPR (bpm):  121  MPHR obtained (bpm):  134 % MPHR obtained: 93  Reached 85% MPHR (min:sec):  3:45 Total Exercise Time (min-sec):  5:01  Workload in METS:  7.00 Borg Scale: Not recorded  Reason ETT Terminated:  dyspnea    ST Segment Analysis At Rest: normal ST segments - no evidence of significant ST depression With Exercise: no evidence of significant ST depression  Other Information Arrhythmia:  No Angina during ETT:  absent (0) Quality of ETT:  diagnostic  ETT Interpretation:  normal - no evidence of ischemia by ST analysis  Comments: The patient had an poor to moderate exercise tolerance.  There was no chest pain.  There was an appropriate level of dyspnea.  There were no arrhythmias, a normal heart rate response and normal BP response.  There were no ischemic ST T wave changes and a normal heart rate recovery.  Recommendations: Negative adequate ETT.  No further testing is indicated.

## 2014-02-07 ENCOUNTER — Other Ambulatory Visit: Payer: Self-pay | Admitting: Cardiology

## 2014-02-23 ENCOUNTER — Telehealth: Payer: Self-pay | Admitting: Cardiology

## 2014-02-23 NOTE — Telephone Encounter (Signed)
[name] [name] is calling in stating that the pt is in the office trying to get his Handicapped plates and on the form, the [place] license number is missing. Please advise.  thanks

## 2014-02-23 NOTE — Telephone Encounter (Signed)
Spoke w/ caller. NPI # given, call resolved.

## 2014-03-11 ENCOUNTER — Other Ambulatory Visit: Payer: Self-pay | Admitting: Cardiology

## 2014-03-12 NOTE — Telephone Encounter (Signed)
Rx(s) sent to pharmacy electronically.  

## 2014-05-22 ENCOUNTER — Other Ambulatory Visit: Payer: Self-pay | Admitting: Cardiology

## 2014-08-14 ENCOUNTER — Other Ambulatory Visit: Payer: Self-pay

## 2014-10-02 ENCOUNTER — Ambulatory Visit (HOSPITAL_COMMUNITY)
Admission: RE | Admit: 2014-10-02 | Discharge: 2014-10-02 | Disposition: A | Discharge status: Home / Self Care | Payer: Medicare Other | Source: Ambulatory Visit | Attending: Internal Medicine | Admitting: Internal Medicine

## 2014-10-02 ENCOUNTER — Other Ambulatory Visit (HOSPITAL_BASED_OUTPATIENT_CLINIC_OR_DEPARTMENT_OTHER): Payer: Medicare Other

## 2014-10-02 ENCOUNTER — Telehealth: Payer: Self-pay | Admitting: *Deleted

## 2014-10-02 ENCOUNTER — Encounter (HOSPITAL_COMMUNITY): Payer: Self-pay

## 2014-10-02 DIAGNOSIS — Z9221 Personal history of antineoplastic chemotherapy: Secondary | ICD-10-CM | POA: Diagnosis not present

## 2014-10-02 DIAGNOSIS — I313 Pericardial effusion (noninflammatory): Secondary | ICD-10-CM | POA: Insufficient documentation

## 2014-10-02 DIAGNOSIS — I2510 Atherosclerotic heart disease of native coronary artery without angina pectoris: Secondary | ICD-10-CM | POA: Diagnosis not present

## 2014-10-02 DIAGNOSIS — C3492 Malignant neoplasm of unspecified part of left bronchus or lung: Secondary | ICD-10-CM | POA: Insufficient documentation

## 2014-10-02 DIAGNOSIS — N289 Disorder of kidney and ureter, unspecified: Secondary | ICD-10-CM | POA: Diagnosis not present

## 2014-10-02 DIAGNOSIS — Z85118 Personal history of other malignant neoplasm of bronchus and lung: Secondary | ICD-10-CM

## 2014-10-02 DIAGNOSIS — C3490 Malignant neoplasm of unspecified part of unspecified bronchus or lung: Secondary | ICD-10-CM

## 2014-10-02 DIAGNOSIS — I77810 Thoracic aortic ectasia: Secondary | ICD-10-CM | POA: Diagnosis not present

## 2014-10-02 DIAGNOSIS — I700 Atherosclerosis of aorta: Secondary | ICD-10-CM | POA: Insufficient documentation

## 2014-10-02 DIAGNOSIS — J984 Other disorders of lung: Secondary | ICD-10-CM | POA: Diagnosis not present

## 2014-10-02 DIAGNOSIS — J8410 Pulmonary fibrosis, unspecified: Secondary | ICD-10-CM | POA: Insufficient documentation

## 2014-10-02 LAB — CBC WITH DIFFERENTIAL/PLATELET
BASO%: 0.9 % (ref 0.0–2.0)
BASOS ABS: 0.1 10*3/uL (ref 0.0–0.1)
EOS%: 2.3 % (ref 0.0–7.0)
Eosinophils Absolute: 0.2 10*3/uL (ref 0.0–0.5)
HEMATOCRIT: 47 % (ref 38.4–49.9)
HGB: 15.7 g/dL (ref 13.0–17.1)
LYMPH#: 1.8 10*3/uL (ref 0.9–3.3)
LYMPH%: 23.2 % (ref 14.0–49.0)
MCH: 32.1 pg (ref 27.2–33.4)
MCHC: 33.4 g/dL (ref 32.0–36.0)
MCV: 96 fL (ref 79.3–98.0)
MONO#: 1 10*3/uL — AB (ref 0.1–0.9)
MONO%: 12.6 % (ref 0.0–14.0)
NEUT#: 4.8 10*3/uL (ref 1.5–6.5)
NEUT%: 61 % (ref 39.0–75.0)
PLATELETS: 238 10*3/uL (ref 140–400)
RBC: 4.89 10*6/uL (ref 4.20–5.82)
RDW: 13.7 % (ref 11.0–14.6)
WBC: 7.9 10*3/uL (ref 4.0–10.3)

## 2014-10-02 LAB — COMPREHENSIVE METABOLIC PANEL (CC13)
ALT: 13 U/L (ref 0–55)
ANION GAP: 11 meq/L (ref 3–11)
AST: 21 U/L (ref 5–34)
Albumin: 4.2 g/dL (ref 3.5–5.0)
Alkaline Phosphatase: 39 U/L — ABNORMAL LOW (ref 40–150)
BUN: 21.2 mg/dL (ref 7.0–26.0)
CHLORIDE: 102 meq/L (ref 98–109)
CO2: 25 mEq/L (ref 22–29)
Calcium: 10 mg/dL (ref 8.4–10.4)
Creatinine: 1.5 mg/dL — ABNORMAL HIGH (ref 0.7–1.3)
EGFR: 44 mL/min/{1.73_m2} — ABNORMAL LOW (ref >90)
Glucose: 96 mg/dl (ref 70–140)
POTASSIUM: 4.4 meq/L (ref 3.5–5.1)
Sodium: 138 mEq/L (ref 136–145)
Total Bilirubin: 0.54 mg/dL (ref 0.20–1.20)
Total Protein: 7.6 g/dL (ref 6.4–8.3)

## 2014-10-02 NOTE — Telephone Encounter (Signed)
THE REPORT WAS READ AND FAXED TO [facility]. NOTIFIED DR.MOHAMED'S NURSE, DIANE BELL,RN. THE REPORT WAS PLACED ON DR.MOHAMED'S DESK.

## 2014-10-04 ENCOUNTER — Telehealth: Payer: Self-pay | Admitting: Internal Medicine

## 2014-10-04 ENCOUNTER — Encounter: Payer: Self-pay | Admitting: Internal Medicine

## 2014-10-04 ENCOUNTER — Ambulatory Visit (HOSPITAL_BASED_OUTPATIENT_CLINIC_OR_DEPARTMENT_OTHER): Payer: Medicare Other | Admitting: Internal Medicine

## 2014-10-04 VITALS — BP 182/66 | HR 64 | Temp 97.9°F | Resp 17 | Ht 67.0 in | Wt 171.3 lb

## 2014-10-04 DIAGNOSIS — Z85118 Personal history of other malignant neoplasm of bronchus and lung: Secondary | ICD-10-CM

## 2014-10-04 DIAGNOSIS — I10 Essential (primary) hypertension: Secondary | ICD-10-CM

## 2014-10-04 NOTE — Telephone Encounter (Signed)
Appointments made and avs mailed to patient

## 2014-10-04 NOTE — Progress Notes (Signed)
[place] [phone]   [phone]  OFFICE PROGRESS NOTE  [name] Cobble, MD 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   PRIOR THERAPY:  #1 status post 3 cycles of neoadjuvant chemotherapy with cisplatin and docetaxel. Last dose was given 12/20/2004 was significant response to the treatment.  #2 status post left pneumonectomy under the care of Dr. Arlyce Dice on 02/21/2005 and the final pathology was consistent with a stage II B. (T2, N1, MX) non-small cell lung cancer.  #3 status post 3 cycles of adjuvant chemotherapy was carboplatin and paclitaxel. Last dose was given 05/20/2005.   CURRENT THERAPY: Observation.  INTERVAL HISTORY: [name] [name] 80 y.o. male returns to the clinic today for annual follow up visit. The patient is feeling fine today with no specific complaints except for intermittent insomnia. His blood pressure is still elevated and he is in multiple blood pressure medications. He denied having any significant weight loss or night sweats. The patient denied having any chest pain, shortness of breath, cough or hemoptysis. He had repeat CT scan of the chest performed recently and he is here for evaluation and discussion of his scan results.  MEDICAL HISTORY: Past Medical History  Diagnosis Date  . BRADYCARDIA   . CAD   . DYSLIPIDEMIA   . HYPERTENSION   . POSTSURG PERCUT TRANSLUMINAL COR ANGPLSTY STS     LAD 50%, mid RCA 90% treated with BMS 2008  . SLEEP DISORDER   . Squamous cell lung cancer 2006    ALLERGIES:  has No Known Allergies.  MEDICATIONS:  Current Outpatient Prescriptions  Medication Sig Dispense Refill  . amLODipine (NORVASC) 5 MG tablet TAKE 1 TABLET DAILY 90 tablet 2  . aspirin 81 MG tablet Take 81 mg by mouth daily.    Marland Kitchen  lisinopril (PRINIVIL,ZESTRIL) 40 MG tablet Take 1 tablet (40 mg total) by mouth daily. 90 tablet 3  . pravastatin (PRAVACHOL) 80 MG tablet Take 1 tablet (80 mg total) by mouth daily. 90 tablet 3  . ranitidine (ZANTAC) 150 MG tablet Take 1 tablet (150 mg total) by mouth 2 (two) times daily. 60 tablet 2   No current facility-administered medications for this visit.    SURGICAL HISTORY:  Past Surgical History  Procedure Laterality Date  . Pneumonectomy      chemotherapy in 2006  . Cataract extraction, bilateral  2013  . Coronary artery stent  2008    REVIEW OF SYSTEMS:  A comprehensive review of systems was negative.   PHYSICAL EXAMINATION: General appearance: alert, cooperative and no distress Head: Normocephalic, without obvious abnormality, atraumatic Neck: no adenopathy Lymph nodes: Cervical, supraclavicular, and axillary nodes normal. Resp: clear to auscultation on the right with absent breath sounds and dullness to percussion on the left. Cardio: regular rate and rhythm, S1, S2 normal, no murmur, click, rub or gallop GI: soft, non-tender; bowel sounds normal; no masses,  no organomegaly Extremities: extremities normal, atraumatic, no cyanosis or edema  ECOG PERFORMANCE STATUS: 1 - Symptomatic but completely ambulatory  Blood pressure 182/66, pulse 64, temperature 97.9 F (36.6 C), temperature source Oral, resp. rate 17, height '5\' 7"'$  (1.702 m), weight 171 lb 4.8 oz (77.701 kg), SpO2 100 %.  LABORATORY DATA: Lab Results  Component Value Date   WBC 7.9 10/02/2014  HGB 15.7 10/02/2014   HCT 47.0 10/02/2014   MCV 96.0 10/02/2014   PLT 238 10/02/2014      Chemistry      Component Value Date/Time   NA 138 10/02/2014 0800   NA 125* 05/14/2012 1216   NA 130 10/07/2010 0803   K 4.4 10/02/2014 0800   K 4.0 05/14/2012 1216   K 4.6 10/07/2010 0803   CL 89* 05/14/2012 1216   CL 87* 10/07/2010 0803   CO2 25 10/02/2014 0800   CO2 24 05/14/2012 1216   CO2 27 10/07/2010 0803     BUN 21.2 10/02/2014 0800   BUN 18 05/14/2012 1216   BUN 14 10/07/2010 0803   CREATININE 1.5* 10/02/2014 0800   CREATININE 1.7* 05/14/2012 1216   CREATININE 1.2 10/07/2010 0803      Component Value Date/Time   CALCIUM 10.0 10/02/2014 0800   CALCIUM 9.3 05/14/2012 1216   CALCIUM 9.2 10/07/2010 0803   ALKPHOS 39* 10/02/2014 0800   ALKPHOS 45 12/02/2013 0951   ALKPHOS 47 10/07/2010 0803   AST 21 10/02/2014 0800   AST 32 12/02/2013 0951   AST 25 10/07/2010 0803   ALT 13 10/02/2014 0800   ALT 16 12/02/2013 0951   ALT 17 10/07/2010 0803   BILITOT 0.54 10/02/2014 0800   BILITOT 0.6 12/02/2013 0951   BILITOT 0.90 10/07/2010 0803       RADIOGRAPHIC STUDIES: Ct Chest Wo Contrast  10/02/2014   CLINICAL DATA:  Left lung cancer diagnosed in 2006 with resection. Completed chemotherapy in 2007. Surveillance imaging.  EXAM: CT CHEST WITHOUT CONTRAST  TECHNIQUE: Multidetector CT imaging of the chest was performed following the standard protocol without IV contrast.  COMPARISON:  10/04/2013  FINDINGS: Mediastinum/Nodes: Left pneumonectomy with resulting leftward deviation of mediastinal structures. Coronary, aortic arch, and branch vessel atherosclerotic vascular disease. Small, stable pericardial effusion. Mildly ectatic descending thoracic aorta. Small mediastinal lymph nodes are not pathologically enlarged and appear similar to the prior exam.  Lungs/Pleura: Stable calcified granulomas in the right lung.  On images 11-14 of series 5, there is a somewhat irregular area of density anteriorly in the right upper lobe, which seems to be slowly progressive over the last 4 years. This has a scar-like appearance.  Upper abdomen: Punctate calcification in the left hepatic lobe favoring old granulomatous disease. Partial visualization of a fluid density exophytic lesion from the left mid kidney, probably a cyst. Adrenal glands unremarkable.  Musculoskeletal: Degenerative arthropathy of the sternoclavicular  joints. Old rib deformities on the left.  IMPRESSION: 1. In my opinion, the 0.8 by 0.7 cm scar-like density along the right kidney upper pole anteriorly has slowly increased in prominence of the past 5 years. The possibility of low-grade adenocarcinoma is not excluded. Percutaneous biopsy of this lesion might be tricky given the position underneath the second rib. The left pneumonectomy may also a raise the risk of attempts at resection. Given the very minimal/low progression, this may warrant observation. 2. Coronary, aortic arch, and branch vessel atherosclerotic vascular disease.   Electronically Signed   By: Van Clines M.D.   On: 10/02/2014 09:06   ASSESSMENT AND PLAN: this is a very pleasant 80 years old white male with history of stage IIIA non-small cell lung cancer status post neoadjuvant chemotherapy followed by left pneumonectomy followed by 3 cycles of adjuvant chemotherapy and has been observation since April 2007 with no evidence for disease recurrence.  His recent CT scan of the chest showed no evidence for disease progression  but there was a suspicious 0.8 x 0.7 cm scar like density in the right upper lobe that has been stable for several years except for mild prominence. I discussed the scan results with the patient today. For the hypertension, I advised the patient to reconsult with his primary care physician regarding adjustment of his blood pressure medication and also for management of his insomnia. I recommended for him to continue on observation with repeat CT scan of the chest in one year. He was advised to call immediately if he has any concerning symptoms in the interval.  The patient voices understanding of current disease status and treatment options and is in agreement with the current care plan.  All questions were answered. The patient knows to call the clinic with any problems, questions or concerns. We can certainly see the patient much sooner if  necessary.  Disclaimer: This note was dictated with voice recognition software. Similar sounding words can inadvertently be transcribed and may be missed upon review.

## 2015-01-29 ENCOUNTER — Encounter: Payer: Self-pay | Admitting: Cardiology

## 2015-01-29 ENCOUNTER — Ambulatory Visit (INDEPENDENT_AMBULATORY_CARE_PROVIDER_SITE_OTHER): Payer: Medicare Other | Admitting: Cardiology

## 2015-01-29 VITALS — BP 144/80 | HR 68 | Ht 66.0 in | Wt 174.0 lb

## 2015-01-29 DIAGNOSIS — I2510 Atherosclerotic heart disease of native coronary artery without angina pectoris: Secondary | ICD-10-CM

## 2015-01-29 DIAGNOSIS — I10 Essential (primary) hypertension: Secondary | ICD-10-CM | POA: Diagnosis not present

## 2015-01-29 MED ORDER — PRAVASTATIN SODIUM 80 MG PO TABS: 80.0000 mg | ORAL_TABLET | Freq: Every day | ORAL | Status: DC

## 2015-01-29 MED ORDER — AMLODIPINE BESYLATE 5 MG PO TABS: 5.0000 mg | ORAL_TABLET | Freq: Every day | ORAL | Status: DC

## 2015-01-29 MED ORDER — LISINOPRIL 40 MG PO TABS: 40.0000 mg | ORAL_TABLET | Freq: Every day | ORAL | Status: DC

## 2015-01-29 NOTE — Patient Instructions (Signed)
Your physician wants you to follow-up in: 1 Year. You will receive a reminder letter in the mail two months in advance. If you don't receive a letter, please call our office to schedule the follow-up appointment.  Merry Christmas and Happy New Year!!

## 2015-01-29 NOTE — Progress Notes (Signed)
   HPI The patient presents for evaluation of high blood pressure. Since he was last seen he's had no new problems. The patient denies any new symptoms such as chest discomfort, neck or arm discomfort since stress testing last year. There has been no new shortness of breath, PND or orthopnea. There have been no reported palpitations, presyncope or syncope.  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  He was having stomach upset last year but this doesn't bother him now.   No Known Allergies  Current Outpatient Prescriptions  Medication Sig Dispense Refill  . amLODipine (NORVASC) 5 MG tablet TAKE 1 TABLET DAILY 90 tablet 2  . aspirin 81 MG tablet Take 81 mg by mouth daily.    Marland Kitchen lisinopril (PRINIVIL,ZESTRIL) 40 MG tablet Take 1 tablet (40 mg total) by mouth daily. 90 tablet 3  . pravastatin (PRAVACHOL) 80 MG tablet Take 1 tablet (80 mg total) by mouth daily. 90 tablet 3  . ranitidine (ZANTAC) 150 MG tablet Take 1 tablet (150 mg total) by mouth 2 (two) times daily. 60 tablet 2   No current facility-administered medications for this visit.    Past Medical History  Diagnosis Date  . BRADYCARDIA   . CAD   . DYSLIPIDEMIA   . HYPERTENSION   . POSTSURG PERCUT TRANSLUMINAL COR ANGPLSTY STS     LAD 50%, mid RCA 90% treated with BMS 2008  . SLEEP DISORDER   . Squamous cell lung cancer ([place]) 2006    Past Surgical History  Procedure Laterality Date  . Pneumonectomy      chemotherapy in 2006  . Cataract extraction, bilateral  2013  . Coronary artery stent  2008    ROS:  Insomnia.  Otherwise as stated in the HPI and negative for all other systems.  PHYSICAL EXAM BP 144/80 mmHg  Pulse 68  Ht '5\' 6"'$  (1.676 m)  Wt 174 lb (78.926 kg)  BMI 28.10 kg/m2 GENERAL:  Well appearing HEENT:  PERRL, upper dentures NECK:  No jugular venous distention, waveform within normal limits, carotid upstroke brisk and symmetric, no bruits, no thyromegaly LUNGS:  Clear to auscultation  bilaterally HEART:  PMI not displaced or sustained,S1 and S2 within normal limits, no S3, no S4, no clicks, no rubs, no murmurs ABD:  Flat, positive bowel sounds normal in frequency in pitch, no bruits, no rebound, no guarding, no midline pulsatile mass, no hepatomegaly, no splenomegaly EXT:  2 plus pulses throughout, no edema, no cyanosis no clubbing   EKG:  Sinus rhythm, rate 68, axis within normal limits, intervals within normal limits, no acute ST-T wave changes.  01/29/2015  ASSESSMENT AND PLAN  HTN:  The blood pressure is usually OK.  However,it fluctuates.  He will keep a watch on this at home.  CAD:  The patient has no new sypmtoms.  No further cardiovascular testing is indicated.  We will continue with aggressive risk reduction and meds as listed.  DYSLIPIDEMIA:  I don't see a lipid profile last year was OK.  He will remain on meds as listed.

## 2015-03-30 ENCOUNTER — Telehealth: Payer: Self-pay | Admitting: Cardiology

## 2015-03-30 DIAGNOSIS — R1013 Epigastric pain: Secondary | ICD-10-CM

## 2015-03-30 MED ORDER — PRAVASTATIN SODIUM 80 MG PO TABS: 80.0000 mg | ORAL_TABLET | Freq: Every day | ORAL | Status: DC

## 2015-03-30 MED ORDER — AMLODIPINE BESYLATE 5 MG PO TABS: 5.0000 mg | ORAL_TABLET | Freq: Every day | ORAL | Status: DC

## 2015-03-30 MED ORDER — RANITIDINE HCL 150 MG PO TABS: 150.0000 mg | ORAL_TABLET | Freq: Two times a day (BID) | ORAL | Status: DC

## 2015-03-30 MED ORDER — LISINOPRIL 40 MG PO TABS: 40.0000 mg | ORAL_TABLET | Freq: Every day | ORAL | Status: DC

## 2015-03-30 NOTE — Telephone Encounter (Signed)
Amlodipine dispensed to local pharmacy, rx(s) sent to new mail order pharmacy on file. Caller voiced acknowledgment and thanks. She will follow up w/ [facility] to ensure shipment.

## 2015-03-30 NOTE — Telephone Encounter (Signed)
New message   Pt daughter calling for insurance change for medication  [place]  - for all medications

## 2015-03-30 NOTE — Telephone Encounter (Signed)
Will route to [facility] office refill pool

## 2015-04-02 ENCOUNTER — Other Ambulatory Visit: Payer: Self-pay | Admitting: *Deleted

## 2015-05-01 ENCOUNTER — Other Ambulatory Visit: Payer: Self-pay | Admitting: Cardiology

## 2015-05-01 ENCOUNTER — Telehealth: Payer: Self-pay | Admitting: Cardiology

## 2015-05-01 DIAGNOSIS — R1013 Epigastric pain: Secondary | ICD-10-CM

## 2015-05-01 MED ORDER — PRAVASTATIN SODIUM 80 MG PO TABS: 80.0000 mg | ORAL_TABLET | Freq: Every day | ORAL | Status: DC

## 2015-05-01 MED ORDER — RANITIDINE HCL 150 MG PO TABS: 150.0000 mg | ORAL_TABLET | Freq: Two times a day (BID) | ORAL | Status: DC

## 2015-05-01 MED ORDER — AMLODIPINE BESYLATE 5 MG PO TABS: 5.0000 mg | ORAL_TABLET | Freq: Every day | ORAL | Status: DC

## 2015-05-01 MED ORDER — LISINOPRIL 40 MG PO TABS: 40.0000 mg | ORAL_TABLET | Freq: Every day | ORAL | Status: DC

## 2015-05-01 NOTE — Telephone Encounter (Signed)
Spoke to patient's daughter - she states there are no Rx's on file with [facility] for patient's meds. Informed her that meds were all refilled to this pharmacy on 03/30/2015 when she called in about this. Medication refills resent to pharmacy

## 2015-05-01 NOTE — Telephone Encounter (Signed)
New message    Pt daughter is calling for her dad to set up mail order for all his prescriptions

## 2015-10-01 ENCOUNTER — Other Ambulatory Visit (HOSPITAL_BASED_OUTPATIENT_CLINIC_OR_DEPARTMENT_OTHER): Payer: Medicare Other

## 2015-10-01 ENCOUNTER — Ambulatory Visit (HOSPITAL_COMMUNITY)
Admission: RE | Admit: 2015-10-01 | Discharge: 2015-10-01 | Disposition: A | Discharge status: Home / Self Care | Payer: Medicare Other | Source: Ambulatory Visit | Attending: Internal Medicine | Admitting: Internal Medicine

## 2015-10-01 DIAGNOSIS — Z85118 Personal history of other malignant neoplasm of bronchus and lung: Secondary | ICD-10-CM

## 2015-10-01 DIAGNOSIS — C3492 Malignant neoplasm of unspecified part of left bronchus or lung: Secondary | ICD-10-CM

## 2015-10-01 DIAGNOSIS — I700 Atherosclerosis of aorta: Secondary | ICD-10-CM | POA: Insufficient documentation

## 2015-10-01 DIAGNOSIS — R911 Solitary pulmonary nodule: Secondary | ICD-10-CM | POA: Diagnosis not present

## 2015-10-01 DIAGNOSIS — I2510 Atherosclerotic heart disease of native coronary artery without angina pectoris: Secondary | ICD-10-CM | POA: Diagnosis not present

## 2015-10-01 LAB — CBC WITH DIFFERENTIAL/PLATELET
BASO%: 0.6 % (ref 0.0–2.0)
BASOS ABS: 0 10*3/uL (ref 0.0–0.1)
EOS ABS: 0.1 10*3/uL (ref 0.0–0.5)
EOS%: 2.5 % (ref 0.0–7.0)
HCT: 44.3 % (ref 38.4–49.9)
HGB: 14.8 g/dL (ref 13.0–17.1)
LYMPH%: 19.5 % (ref 14.0–49.0)
MCH: 31.8 pg (ref 27.2–33.4)
MCHC: 33.4 g/dL (ref 32.0–36.0)
MCV: 95.4 fL (ref 79.3–98.0)
MONO#: 0.9 10*3/uL (ref 0.1–0.9)
MONO%: 15.2 % — AB (ref 0.0–14.0)
NEUT%: 62.2 % (ref 39.0–75.0)
NEUTROS ABS: 3.7 10*3/uL (ref 1.5–6.5)
PLATELETS: 250 10*3/uL (ref 140–400)
RBC: 4.64 10*6/uL (ref 4.20–5.82)
RDW: 13.7 % (ref 11.0–14.6)
WBC: 5.9 10*3/uL (ref 4.0–10.3)
lymph#: 1.2 10*3/uL (ref 0.9–3.3)

## 2015-10-01 LAB — COMPREHENSIVE METABOLIC PANEL
ALT: 12 U/L (ref 0–55)
ANION GAP: 8 meq/L (ref 3–11)
AST: 20 U/L (ref 5–34)
Albumin: 3.7 g/dL (ref 3.5–5.0)
Alkaline Phosphatase: 44 U/L (ref 40–150)
BUN: 12.7 mg/dL (ref 7.0–26.0)
CO2: 25 meq/L (ref 22–29)
Calcium: 9.9 mg/dL (ref 8.4–10.4)
Chloride: 99 mEq/L (ref 98–109)
Creatinine: 1.2 mg/dL (ref 0.7–1.3)
EGFR: 55 mL/min/{1.73_m2} — AB (ref >90)
Glucose: 102 mg/dl (ref 70–140)
POTASSIUM: 4.9 meq/L (ref 3.5–5.1)
SODIUM: 133 meq/L — AB (ref 136–145)
TOTAL PROTEIN: 7.4 g/dL (ref 6.4–8.3)
Total Bilirubin: 0.74 mg/dL (ref 0.20–1.20)

## 2015-10-04 ENCOUNTER — Encounter: Payer: Self-pay | Admitting: Internal Medicine

## 2015-10-04 ENCOUNTER — Ambulatory Visit (HOSPITAL_BASED_OUTPATIENT_CLINIC_OR_DEPARTMENT_OTHER): Payer: Medicare Other | Admitting: Internal Medicine

## 2015-10-04 ENCOUNTER — Telehealth: Payer: Self-pay | Admitting: Internal Medicine

## 2015-10-04 VITALS — BP 180/64 | HR 58 | Temp 97.8°F | Resp 19 | Ht 66.0 in | Wt 164.8 lb

## 2015-10-04 DIAGNOSIS — C3492 Malignant neoplasm of unspecified part of left bronchus or lung: Secondary | ICD-10-CM

## 2015-10-04 DIAGNOSIS — Z85118 Personal history of other malignant neoplasm of bronchus and lung: Secondary | ICD-10-CM | POA: Diagnosis not present

## 2015-10-04 NOTE — Progress Notes (Signed)
[place] Telephone:([phone]   Fax:([phone]) [phone]  OFFICE PROGRESS NOTE  [name], MD [place] [address]  DIAGNOSIS: Stage IIIA (T3, N2, M0) non-small cell lung cancer consistent with squamous cell carcinoma involving the left hilum and AP window mediastinal lymph nodes diagnosed in September of 2006.   PRIOR THERAPY:  #1 status post 3 cycles of neoadjuvant chemotherapy with cisplatin and docetaxel. Last dose was given 12/20/2004 was significant response to the treatment.  #2 status post left pneumonectomy under the care of Dr. Arlyce Dice on 02/21/2005 and the final pathology was consistent with a stage II B. (T2, N1, MX) non-small cell lung cancer.  #3 status post 3 cycles of adjuvant chemotherapy with carboplatin and paclitaxel. Last dose was given 05/20/2005.   CURRENT THERAPY: Observation.  INTERVAL HISTORY: [name] [name] 81 y.o. male returns to the clinic today for annual follow up visit. The patient is feeling fine today with no specific complaints except for uncontrolled hypertension. He gets very anxious coming to the clinic worried about his scan results. He denied having any significant weight loss or night sweats. The patient denied having any chest pain, shortness of breath, cough or hemoptysis. He had repeat CT scan of the chest performed recently and he is here for evaluation and discussion of his scan results.  MEDICAL HISTORY: Past Medical History:  Diagnosis Date  . BRADYCARDIA   . CAD   . DYSLIPIDEMIA   . HYPERTENSION   . POSTSURG PERCUT TRANSLUMINAL COR ANGPLSTY STS    LAD 50%, mid RCA 90% treated with BMS 2008  . SLEEP DISORDER   . Squamous cell lung cancer ([place]) 2006    ALLERGIES:  has No Known Allergies.  MEDICATIONS:  Current Outpatient Prescriptions  Medication Sig Dispense Refill  . amLODipine (NORVASC) 5 MG tablet Take 1 tablet (5 mg total) by mouth daily. 90 tablet 3  . aspirin 81 MG tablet Take 81 mg by  mouth daily.    Marland Kitchen lisinopril (PRINIVIL,ZESTRIL) 40 MG tablet Take 1 tablet (40 mg total) by mouth daily. 90 tablet 3  . pravastatin (PRAVACHOL) 80 MG tablet Take 1 tablet (80 mg total) by mouth daily. 90 tablet 3  . ranitidine (ZANTAC) 150 MG tablet Take 1 tablet (150 mg total) by mouth 2 (two) times daily. 180 tablet 3   No current facility-administered medications for this visit.     SURGICAL HISTORY:  Past Surgical History:  Procedure Laterality Date  . CATARACT EXTRACTION, BILATERAL  2013  . Coronary artery stent  2008  . PNEUMONECTOMY     chemotherapy in 2006    REVIEW OF SYSTEMS:  A comprehensive review of systems was negative.   PHYSICAL EXAMINATION: General appearance: alert, cooperative and no distress Head: Normocephalic, without obvious abnormality, atraumatic Neck: no adenopathy Lymph nodes: Cervical, supraclavicular, and axillary nodes normal. Resp: clear to auscultation on the right with absent breath sounds and dullness to percussion on the left. Cardio: regular rate and rhythm, S1, S2 normal, no murmur, click, rub or gallop GI: soft, non-tender; bowel sounds normal; no masses,  no organomegaly Extremities: extremities normal, atraumatic, no cyanosis or edema  ECOG PERFORMANCE STATUS: 1 - Symptomatic but completely ambulatory  Blood pressure (!) 180/64, pulse (!) 58, temperature 97.8 F (36.6 C), temperature source Oral, resp. rate 19, height '5\' 6"'$  (1.676 m), weight 164 lb 12.8 oz (74.8 kg), SpO2 100 %.  LABORATORY DATA: Lab Results  Component Value Date  WBC 5.9 10/01/2015   HGB 14.8 10/01/2015   HCT 44.3 10/01/2015   MCV 95.4 10/01/2015   PLT 250 10/01/2015      Chemistry      Component Value Date/Time   NA 133 (L) 10/01/2015 0846   K 4.9 10/01/2015 0846   CL 89 (L) 05/14/2012 1216   CL 87 (L) 10/07/2010 0803   CO2 25 10/01/2015 0846   BUN 12.7 10/01/2015 0846   CREATININE 1.2 10/01/2015 0846      Component Value Date/Time   CALCIUM 9.9  10/01/2015 0846   ALKPHOS 44 10/01/2015 0846   AST 20 10/01/2015 0846   ALT 12 10/01/2015 0846   BILITOT 0.74 10/01/2015 0846       RADIOGRAPHIC STUDIES: Ct Chest Wo Contrast  Result Date: 10/01/2015 CLINICAL DATA:  Restaging left lung cancer diagnosed 2006 status post left lobectomy with prior chemotherapy. EXAM: CT CHEST WITHOUT CONTRAST TECHNIQUE: Multidetector CT imaging of the chest was performed following the standard protocol without IV contrast. COMPARISON:  10/02/2014 chest CT. FINDINGS: Mediastinum/Nodes: Normal heart size. No significant pericardial fluid/thickening. Left main, left anterior descending, left circumflex and right coronary atherosclerosis. Atherosclerotic thoracic aorta. Stable ectasia of the proximal descending thoracic aorta, maximum diameter 3.9 cm. Normal caliber pulmonary arteries. No discrete thyroid nodules. Unremarkable esophagus. No pathologically enlarged axillary, mediastinal or gross hilar lymph nodes, noting limited sensitivity for the detection of hilar adenopathy on this noncontrast study. Stable top-normal size 0.9 cm subcarinal node. Lungs/Pleura: Status post left pneumonectomy with left mediastinal shift. No pneumothorax. Stable mild pleural thickening and small volume effusion in the left pleural space. No right pleural effusion. Stable calcified granulomas scattered throughout the right lung. No acute consolidative airspace disease or new significant pulmonary nodules. Irregular subpleural 0.8 x 0.7 cm anterior right upper lobe pulmonary nodule (series 4/ image 12), previously 0.8 x 0.7 cm on 10/02/2014, not appreciably changed. Upper abdomen: Calcified liver granuloma. Simple 3.6 cm exophytic posterior upper left renal cyst. Musculoskeletal: No aggressive appearing focal osseous lesions. Mild thoracic spondylosis. IMPRESSION: 1. No evidence of local tumor recurrence status post left pneumonectomy. 2. No evidence of metastatic disease in the chest. The  irregular subpleural anterior right upper lobe 8 mm pulmonary nodule has not appreciably changed. 3. Aortic atherosclerosis. Left main and 3 vessel coronary atherosclerosis. Electronically Signed   By: Ilona Sorrel M.D.   On: 10/01/2015 10:47   ASSESSMENT AND PLAN: this is a very pleasant 81 years old white male with history of stage IIIA non-small cell lung cancer status post neoadjuvant chemotherapy followed by left pneumonectomy followed by 3 cycles of adjuvant chemotherapy and has been observation since April 2007 with no evidence for disease recurrence.  The patient has been observation for more than 10 years now and no evidence for disease recurrence. I discussed the scan results with the patient today. I recommended for him to continue on observation and I referred him to the survivorship clinic at the [facility] for follow-up visit in one year. For the hypertension, I advised the patient to reconsult with his primary care physician regarding adjustment of his blood pressure medication. He was advised to call immediately if he has any concerning symptoms in the interval.  The patient voices understanding of current disease status and treatment options and is in agreement with the current care plan.  All questions were answered. The patient knows to call the clinic with any problems, questions or concerns. We can certainly see the patient much sooner if  necessary.  Disclaimer: This note was dictated with voice recognition software. Similar sounding words can inadvertently be transcribed and may be missed upon review.

## 2015-10-04 NOTE — Telephone Encounter (Signed)
Gave patient avs report and appointments for August 2018. Per 8/17 LOS f/u one year survivorship clinic - no more f/u w/MM.

## 2015-10-10 ENCOUNTER — Telehealth: Payer: Self-pay

## 2015-10-10 NOTE — Telephone Encounter (Signed)
-----   Message from Mickie Kay sent at 10/05/2015  8:22 AM EDT ----- Needs PCP  ----- Message ----- From: Hendricks Limes, MD Sent: 10/04/2015  11:57 AM To: Mickie Kay  Please verify patient's new primary care physician and route reports to that individual. Thank you very much. ----- Message ----- From: Curt Bears, MD Sent: 10/04/2015  10:14 AM To: Hendricks Limes, MD

## 2015-10-10 NOTE — Telephone Encounter (Signed)
LVM for pt to call back as soon as possible.   RE: Needs new PCP.   PCP has been removed until an appt has been made.

## 2015-10-11 NOTE — Telephone Encounter (Signed)
Patient called back. Going to look at the website and call back once they know what they are going to do.

## 2016-04-28 ENCOUNTER — Other Ambulatory Visit: Payer: Self-pay | Admitting: Cardiology

## 2016-04-28 DIAGNOSIS — R1013 Epigastric pain: Secondary | ICD-10-CM

## 2016-04-29 ENCOUNTER — Other Ambulatory Visit: Payer: Self-pay

## 2016-04-29 MED ORDER — PRAVASTATIN SODIUM 80 MG PO TABS: 80.0000 mg | ORAL_TABLET | Freq: Every day | ORAL | 3 refills | Status: DC

## 2016-04-29 MED ORDER — AMLODIPINE BESYLATE 5 MG PO TABS: 5.0000 mg | ORAL_TABLET | Freq: Every day | ORAL | 3 refills | Status: DC

## 2016-04-29 MED ORDER — LISINOPRIL 40 MG PO TABS: 40.0000 mg | ORAL_TABLET | Freq: Every day | ORAL | 0 refills | Status: DC

## 2016-05-07 DIAGNOSIS — H35363 Drusen (degenerative) of macula, bilateral: Secondary | ICD-10-CM | POA: Diagnosis not present

## 2016-05-07 DIAGNOSIS — H26491 Other secondary cataract, right eye: Secondary | ICD-10-CM | POA: Diagnosis not present

## 2016-06-09 ENCOUNTER — Encounter (HOSPITAL_COMMUNITY): Payer: Self-pay

## 2016-06-09 ENCOUNTER — Emergency Department (HOSPITAL_COMMUNITY): Payer: Medicare Other

## 2016-06-09 ENCOUNTER — Emergency Department (HOSPITAL_COMMUNITY)
Admission: EM | Admit: 2016-06-09 | Discharge: 2016-06-09 | Disposition: A | Discharge status: Home / Self Care | Payer: Medicare Other | Attending: Emergency Medicine | Admitting: Emergency Medicine

## 2016-06-09 DIAGNOSIS — Z79899 Other long term (current) drug therapy: Secondary | ICD-10-CM | POA: Insufficient documentation

## 2016-06-09 DIAGNOSIS — Z955 Presence of coronary angioplasty implant and graft: Secondary | ICD-10-CM | POA: Insufficient documentation

## 2016-06-09 DIAGNOSIS — I2510 Atherosclerotic heart disease of native coronary artery without angina pectoris: Secondary | ICD-10-CM | POA: Diagnosis not present

## 2016-06-09 DIAGNOSIS — I10 Essential (primary) hypertension: Secondary | ICD-10-CM | POA: Diagnosis not present

## 2016-06-09 DIAGNOSIS — R0789 Other chest pain: Secondary | ICD-10-CM | POA: Insufficient documentation

## 2016-06-09 DIAGNOSIS — Z7982 Long term (current) use of aspirin: Secondary | ICD-10-CM | POA: Diagnosis not present

## 2016-06-09 DIAGNOSIS — R079 Chest pain, unspecified: Secondary | ICD-10-CM | POA: Diagnosis not present

## 2016-06-09 DIAGNOSIS — Z87891 Personal history of nicotine dependence: Secondary | ICD-10-CM | POA: Insufficient documentation

## 2016-06-09 DIAGNOSIS — Z85118 Personal history of other malignant neoplasm of bronchus and lung: Secondary | ICD-10-CM | POA: Diagnosis not present

## 2016-06-09 LAB — CBC
HCT: 40.9 % (ref 39.0–52.0)
Hemoglobin: 14.1 g/dL (ref 13.0–17.0)
MCH: 31.8 pg (ref 26.0–34.0)
MCHC: 34.5 g/dL (ref 30.0–36.0)
MCV: 92.1 fL (ref 78.0–100.0)
PLATELETS: 281 10*3/uL (ref 150–400)
RBC: 4.44 MIL/uL (ref 4.22–5.81)
RDW: 13.6 % (ref 11.5–15.5)
WBC: 7.9 10*3/uL (ref 4.0–10.5)

## 2016-06-09 LAB — BASIC METABOLIC PANEL
Anion gap: 8 (ref 5–15)
BUN: 16 mg/dL (ref 6–20)
CHLORIDE: 102 mmol/L (ref 101–111)
CO2: 24 mmol/L (ref 22–32)
CREATININE: 1.37 mg/dL — AB (ref 0.61–1.24)
Calcium: 9.1 mg/dL (ref 8.9–10.3)
GFR, EST AFRICAN AMERICAN: 55 mL/min — AB (ref >60)
GFR, EST NON AFRICAN AMERICAN: 47 mL/min — AB (ref >60)
Glucose, Bld: 139 mg/dL — ABNORMAL HIGH (ref 65–99)
POTASSIUM: 5 mmol/L (ref 3.5–5.1)
Sodium: 134 mmol/L — ABNORMAL LOW (ref 135–145)

## 2016-06-09 LAB — TROPONIN I

## 2016-06-09 LAB — I-STAT TROPONIN, ED: TROPONIN I, POC: 0 ng/mL (ref 0.00–0.08)

## 2016-06-09 NOTE — Discharge Instructions (Signed)
As discussed, your evaluation today has been largely reassuring.  But, it is important that you monitor your condition carefully, and do not hesitate to return to the ED if you develop new, or concerning changes in your condition. ? ?Otherwise, please follow-up with your physician for appropriate ongoing care. ? ?

## 2016-06-09 NOTE — ED Triage Notes (Signed)
Pt presents for [facility] for evaluation of cp starting last night. Pt reports generalized "not feeling well." States family friend took bp and it was elevated. Pt given 324 ASA and 1 Nitro in route. Denies CP/SOB on arrival.

## 2016-06-09 NOTE — ED Provider Notes (Signed)
[facility] DEPT Provider Note   CSN: [id number] Arrival date & time: 06/09/16  1248     History   Chief Complaint Chief Complaint  Patient presents with  . Chest Pain    HPI [name] [name] is a 82 y.o. male.  HPI Patient presents after an episode of chest pain, now resolved. Patient is here with 2 daughters who assist with the history of present illness. He notes that earlier today he felt some substernal chest discomfort, nonradiating, uncomfortable, not sharp, not sore. Symptoms were brief, not clearly pleuritic or exertional. Symptoms lasted long enough for the patient to go to a fire department for evaluation. There the patient was found to be hypertensive, but his pain resolved. Patient brought here for evaluation. He denies recent illness, states that he is generally well, which his daughters agree with. No recent medication changes, diet changes, activity changes.    Past Medical History:  Diagnosis Date  . BRADYCARDIA   . CAD   . DYSLIPIDEMIA   . HYPERTENSION   . POSTSURG PERCUT TRANSLUMINAL COR ANGPLSTY STS    LAD 50%, mid RCA 90% treated with BMS 2008  . SLEEP DISORDER   . Squamous cell lung cancer [facility]) 2006    Patient Active Problem List   Diagnosis Date Noted  . Other abnormal glucose 08/08/2011  . SLEEP DISORDER 02/08/2009  . Cancer of left lung parenchyma ([place]) 04/23/2008  . DYSLIPIDEMIA 04/23/2008  . BRADYCARDIA 04/23/2008  . Essential hypertension 03/18/2006  . POSTSURG PERCUT TRANSLUMINAL COR ANGPLSTY STS 03/17/2006  . Coronary atherosclerosis 03/13/2006    Past Surgical History:  Procedure Laterality Date  . CATARACT EXTRACTION, BILATERAL  2013  . Coronary artery stent  2008  . PNEUMONECTOMY     chemotherapy in 2006       Home Medications    Prior to Admission medications   Medication Sig Start Date End Date Taking? Authorizing Provider  amLODipine (NORVASC) 5 MG tablet Take 1 tablet (5 mg total) by mouth daily. PLEASE MAKE  APPOINTMENT FOR FURTHER REFILLS ,1ST ATTEMPT 04/29/16   Minus Breeding, MD  aspirin 81 MG tablet Take 81 mg by mouth daily.    Historical Provider, MD  lisinopril (PRINIVIL,ZESTRIL) 40 MG tablet Take 1 tablet (40 mg total) by mouth daily. PLEASE MAKE APPOINTMENT FOR FURTHER REFILLS ,1ST ATTEMPT 04/29/16   Minus Breeding, MD  pravastatin (PRAVACHOL) 80 MG tablet Take 1 tablet (80 mg total) by mouth daily. PLEASE MAKE APPOINTMENT FOR FURTHER REFILLS , 1ST ATTEMPT 04/29/16   Minus Breeding, MD  ranitidine (ZANTAC) 150 MG tablet Take 1 tablet (150 mg total) by mouth 2 (two) times daily. 05/01/15   Minus Breeding, MD    Family History Family History  Problem Relation Age of Onset  . Heart attack Mother 56  . Heart attack Father     in 103s  . Hyperlipidemia Brother     Social History Social History  Substance Use Topics  . Smoking status: Former Smoker    Quit date: 02/18/1996  . Smokeless tobacco: Never Used  . Alcohol use 21.0 oz/week    35 Cans of beer per week     Comment: 2-3 Beer night     Allergies   Patient has no known allergies.   Review of Systems Review of Systems  Constitutional:       Per HPI, otherwise negative  HENT:       Per HPI, otherwise negative  Respiratory:       Per HPI,  otherwise negative  Cardiovascular:       Per HPI, otherwise negative  Gastrointestinal: Negative for vomiting.  Endocrine:       Negative aside from HPI  Genitourinary:       Neg aside from HPI   Musculoskeletal:       Per HPI, otherwise negative  Skin: Negative.   Neurological: Negative for syncope.     Physical Exam Updated Vital Signs BP (!) 163/67   Pulse (!) 58   Temp 98 F (36.7 C) (Oral)   Resp (!) 21   SpO2 100%   Physical Exam  Constitutional: He is oriented to person, place, and time. He appears well-developed. No distress.  HENT:  Head: Normocephalic and atraumatic.  Eyes: Conjunctivae and EOM are normal.  Cardiovascular: Normal rate and regular rhythm.     Pulmonary/Chest: Effort normal. No stridor. No respiratory distress.  Abdominal: He exhibits no distension.  Musculoskeletal: He exhibits no edema.  Neurological: He is alert and oriented to person, place, and time.  Skin: Skin is warm and dry.  Psychiatric: He has a normal mood and affect.  Nursing note and vitals reviewed.    ED Treatments / Results  Labs (all labs ordered are listed, but only abnormal results are displayed) Labs Reviewed  BASIC METABOLIC PANEL - Abnormal; Notable for the following:       Result Value   Sodium 134 (*)    Glucose, Bld 139 (*)    Creatinine, Ser 1.37 (*)    GFR calc non Af Amer 47 (*)    GFR calc Af Amer 55 (*)    All other components within normal limits  CBC  TROPONIN I  I-STAT TROPOININ, ED    EKG  EKG Interpretation  Date/Time:  Monday June 09 2016 12:52:26 EDT Ventricular Rate:  74 PR Interval:    QRS Duration: 87 QT Interval:  394 QTC Calculation: 438 R Axis:   60 Text Interpretation:  Sinus rhythm Atrial premature complexes Artifact No significant change since last tracing Abnormal ekg Confirmed by Carmin Muskrat  MD [phone]) on 06/09/2016 1:02:42 PM       Radiology Dg Chest 2 View  Result Date: 06/09/2016 CLINICAL DATA:  Chest pain. EXAM: CHEST  2 VIEW COMPARISON:  Radiographs of March 04, 2006. FINDINGS: Status post left pneumonectomy. Right lung is clear and hyperexpanded secondary to mediastinal shift to the left. No pneumothorax or pleural effusion is noted. Bony thorax is unremarkable. IMPRESSION: Status post left pneumonectomy.  No acute abnormality seen. Electronically Signed   By: Marijo Conception, M.D.   On: 06/09/2016 13:38    Procedures Procedures (including critical care time)  Medications Ordered in ED Medications - No data to display   Initial Impression / Assessment and Plan / ED Course  I have reviewed the triage vital signs and the nursing notes.  Pertinent labs & imaging results that were  available during my care of the patient were reviewed by me and considered in my medical decision making (see chart for details).  On repeat exam after second troponin has resulted the patient is in no distress. He is hemodynamically stable, awake, alert, no ongoing pain. With 2 negative troponin, nonischemic EKG, reassuring findings, there is low suspicion for atypical ACS. No evidence for PE, pneumonia. With his improvement, normal vital signs, the patient is appropriate for further evaluation, management as an outpatient. This was discussed at length with the patient and his daughter.   Final Clinical Impressions(s) / ED  Diagnoses  Atypical chest pain   Carmin Muskrat, MD 06/09/16 [id number]

## 2016-06-09 NOTE — ED Notes (Signed)
Pt stable, ambulatory, states understanding of discharge instructions 

## 2016-06-18 DIAGNOSIS — H02839 Dermatochalasis of unspecified eye, unspecified eyelid: Secondary | ICD-10-CM | POA: Diagnosis not present

## 2016-06-18 DIAGNOSIS — H18413 Arcus senilis, bilateral: Secondary | ICD-10-CM | POA: Diagnosis not present

## 2016-06-18 DIAGNOSIS — Z961 Presence of intraocular lens: Secondary | ICD-10-CM | POA: Diagnosis not present

## 2016-06-18 DIAGNOSIS — H26491 Other secondary cataract, right eye: Secondary | ICD-10-CM | POA: Diagnosis not present

## 2016-06-19 NOTE — Progress Notes (Signed)
HPI The patient presents for evaluation of chest pain.  I saw him previously for high blood pressure. He was in the ED recently with chest pain.  I reviewed these records for this visit.  He said that he just didn't feel good. His neighbor checked his blood pressure and it was 200/100. She took him to the fire department and they gave him aspirin and nitroglycerin. He was very vague about any chest discomfort that he might have. In the emergency room he was not found to have any evidence of ischemia. He does note his blood pressures been more elevated and this morning it was 175/86 at home. He's been more fatigued. He's not sleeping at night. He's not having chest discomfort usually. He's not having any new shortness of breath, PND or orthopnea. He's not having any palpitations, presyncope or syncope. He said he just doesn't feel as well as he used to.    No Known Allergies  Current Outpatient Prescriptions  Medication Sig Dispense Refill  . aspirin 81 MG tablet Take 81 mg by mouth daily.    Marland Kitchen lisinopril (PRINIVIL,ZESTRIL) 40 MG tablet Take 1 tablet (40 mg total) by mouth daily. PLEASE MAKE APPOINTMENT FOR FURTHER REFILLS ,1ST ATTEMPT 90 tablet 0  . pravastatin (PRAVACHOL) 80 MG tablet Take 1 tablet (80 mg total) by mouth daily. PLEASE MAKE APPOINTMENT FOR FURTHER REFILLS , 1ST ATTEMPT 90 tablet 3  . ranitidine (ZANTAC) 150 MG tablet Take 1 tablet (150 mg total) by mouth 2 (two) times daily. 180 tablet 3  . amLODipine (NORVASC) 10 MG tablet Take 1 tablet (10 mg total) by mouth daily. 90 tablet 3  . nitroGLYCERIN (NITROSTAT) 0.4 MG SL tablet Place 1 tablet (0.4 mg total) under the tongue every 5 (five) minutes as needed for chest pain. 90 tablet 3   No current facility-administered medications for this visit.     Past Medical History:  Diagnosis Date  . BRADYCARDIA   . CAD   . DYSLIPIDEMIA   . HYPERTENSION   . POSTSURG PERCUT TRANSLUMINAL COR ANGPLSTY STS    LAD 50%, mid RCA 90% treated  with BMS 2008  . SLEEP DISORDER   . Squamous cell lung cancer ([place]) 2006    Past Surgical History:  Procedure Laterality Date  . CATARACT EXTRACTION, BILATERAL  2013  . Coronary artery stent  2008  . PNEUMONECTOMY     chemotherapy in 2006    ROS:  As stated in the HPI and negative for all other systems.  PHYSICAL EXAM BP (!) 160/76   Pulse 62   Ht '5\' 7"'$  (1.702 m)   Wt 172 lb (78 kg)   BMI 26.94 kg/m  GENERAL:  Well appearing,  HEENT:  PERRL, upper dentures NECK:  No jugular venous distention, waveform within normal limits, carotid upstroke brisk and symmetric, no bruits, no thyromegaly LUNGS:  Clear to auscultation bilaterally HEART:  PMI not displaced or sustained,S1 and S2 within normal limits, no S3, no S4, no clicks, no rubs, no murmurs ABD:  Flat, positive bowel sounds normal in frequency in pitch, no bruits, no rebound, no guarding, no midline pulsatile mass, no hepatomegaly, no splenomegaly EXT:  2 plus pulses throughout, no edema, no cyanosis no clubbing   EKG:  Sinus rhythm, rate 74 , no arrhythmia, axis within normal limits, intervals within normal limits, no acute ST-T wave changes.  06/09/16  ASSESSMENT AND PLAN  CHEST PAIN:  This was a day. Once I get his blood pressure is  controlled I will plan a POET (Plain Old Exercise Treadmill)  HTN:  The blood pressure is elevated.  I will increase the Norvasc to 10 mg daily.  CAD:   This will be evaluated as above.   DYSLIPIDEMIA:  I don't see a lipid profile.  I will arrange for a fasting lipid when he has a POET (Plain Old Exercise Treadmill) in the future.   FATIGUE:  I will check a TSH.  This is likely related to not sleeping and we talked about OTC therapy for this.  He was also given referral to get a new PCP who can discuss this with him and treat as needed.   ED records reviewed for this appointment.

## 2016-06-20 ENCOUNTER — Encounter: Payer: Self-pay | Admitting: Cardiology

## 2016-06-20 ENCOUNTER — Ambulatory Visit (INDEPENDENT_AMBULATORY_CARE_PROVIDER_SITE_OTHER): Payer: Medicare Other | Admitting: Cardiology

## 2016-06-20 ENCOUNTER — Ambulatory Visit: Payer: Medicare Other | Admitting: Cardiology

## 2016-06-20 VITALS — BP 160/76 | HR 62 | Ht 67.0 in | Wt 172.0 lb

## 2016-06-20 DIAGNOSIS — I10 Essential (primary) hypertension: Secondary | ICD-10-CM

## 2016-06-20 DIAGNOSIS — Z79899 Other long term (current) drug therapy: Secondary | ICD-10-CM | POA: Diagnosis not present

## 2016-06-20 DIAGNOSIS — R072 Precordial pain: Secondary | ICD-10-CM

## 2016-06-20 DIAGNOSIS — R5383 Other fatigue: Secondary | ICD-10-CM | POA: Diagnosis not present

## 2016-06-20 DIAGNOSIS — E785 Hyperlipidemia, unspecified: Secondary | ICD-10-CM

## 2016-06-20 MED ORDER — AMLODIPINE BESYLATE 10 MG PO TABS: 10.0000 mg | ORAL_TABLET | Freq: Every day | ORAL | 3 refills | Status: DC

## 2016-06-20 MED ORDER — NITROGLYCERIN 0.4 MG SL SUBL: 0.4000 mg | SUBLINGUAL_TABLET | SUBLINGUAL | 3 refills | Status: DC | PRN

## 2016-06-20 NOTE — Patient Instructions (Signed)
Medication Instructions: Nitroglycerin 0.4 mg tablet have been called in for you. This is a take as needed medciation for chest pain. Please follow the instructions on the bottle.  Your Amlodipine has been increased to 10 mg tablet daily.  Labwork: Please have the following lab drawn: TSH   Follow-Up: Your physician recommends that you schedule a follow-up appointment in: 2 months with Dr. Percival Spanish.   Any Additional Special Instructions Will Be Listed Below (If Applicable).  Please keep a log of your blood pressure on a daily basis and bring to your next appointment,.      If you need a refill on your cardiac medications before your next appointment, please call your pharmacy.

## 2016-06-21 LAB — TSH: TSH: 1.4 m[IU]/L (ref 0.40–4.50)

## 2016-06-22 ENCOUNTER — Encounter: Payer: Self-pay | Admitting: Cardiology

## 2016-06-25 DIAGNOSIS — H26491 Other secondary cataract, right eye: Secondary | ICD-10-CM | POA: Diagnosis not present

## 2016-08-07 ENCOUNTER — Encounter: Payer: Self-pay | Admitting: Cardiology

## 2016-08-15 DIAGNOSIS — S40861A Insect bite (nonvenomous) of right upper arm, initial encounter: Secondary | ICD-10-CM | POA: Diagnosis not present

## 2016-08-15 DIAGNOSIS — M1380 Other specified arthritis, unspecified site: Secondary | ICD-10-CM | POA: Diagnosis not present

## 2016-08-15 DIAGNOSIS — L089 Local infection of the skin and subcutaneous tissue, unspecified: Secondary | ICD-10-CM | POA: Diagnosis not present

## 2016-08-15 DIAGNOSIS — W57XXXA Bitten or stung by nonvenomous insect and other nonvenomous arthropods, initial encounter: Secondary | ICD-10-CM | POA: Diagnosis not present

## 2016-08-22 NOTE — Progress Notes (Signed)
HPI The patient presents for evaluation of chest pain.  I saw him previously for high blood pressure. He was in the ED in April with chest pain.  I saw him after this. He had a hypertensive urgency but no evidence of ischemia.  He has had no acute complaints since I saw him.  He is fatigued and doesn't sleep.  He is not very active.  The patient denies any new symptoms such as chest discomfort, neck or arm discomfort. There has been no new shortness of breath, PND or orthopnea. There have been no reported palpitations, presyncope or syncope.  His BPs have been relatively well controlled and I reviewed the diary.  He does have AMs that are in the 224M or 250I systolic but not consistently.     No Known Allergies  Current Outpatient Prescriptions  Medication Sig Dispense Refill  . amLODipine (NORVASC) 10 MG tablet Take 1 tablet (10 mg total) by mouth daily. 90 tablet 3  . aspirin 81 MG tablet Take 81 mg by mouth daily.    Marland Kitchen lisinopril (PRINIVIL,ZESTRIL) 40 MG tablet Take 1 tablet (40 mg total) by mouth daily. PLEASE MAKE APPOINTMENT FOR FURTHER REFILLS ,1ST ATTEMPT 90 tablet 0  . nitroGLYCERIN (NITROSTAT) 0.4 MG SL tablet Place 1 tablet (0.4 mg total) under the tongue every 5 (five) minutes as needed for chest pain. 90 tablet 3  . pravastatin (PRAVACHOL) 80 MG tablet Take 1 tablet (80 mg total) by mouth daily. PLEASE MAKE APPOINTMENT FOR FURTHER REFILLS , 1ST ATTEMPT 90 tablet 3  . ranitidine (ZANTAC) 150 MG tablet Take 1 tablet (150 mg total) by mouth 2 (two) times daily. (Patient taking differently: Take 150 mg by mouth as directed. ) 180 tablet 3   No current facility-administered medications for this visit.     Past Medical History:  Diagnosis Date  . BRADYCARDIA   . CAD   . DYSLIPIDEMIA   . HYPERTENSION   . POSTSURG PERCUT TRANSLUMINAL COR ANGPLSTY STS    LAD 50%, mid RCA 90% treated with BMS 2008  . SLEEP DISORDER   . Squamous cell lung cancer ([place]) 2006    Past Surgical  History:  Procedure Laterality Date  . CATARACT EXTRACTION, BILATERAL  2013  . Coronary artery stent  2008  . PNEUMONECTOMY     chemotherapy in 2006    ROS:    Insomnia.  Otherwise as stated in the HPI and negative for all other systems.  PHYSICAL EXAM BP (!) 151/81   Pulse 84   Ht 5\' 10"  (1.778 m)   Wt 170 lb (77.1 kg)   SpO2 99%   BMI 24.39 kg/m   GENERAL:  Well appearing NECK:  No jugular venous distention, waveform within normal limits, carotid upstroke brisk and symmetric, no bruits, no thyromegaly LUNGS:  Clear to auscultation bilaterally CHEST:  Unremarkable HEART:  PMI not displaced or sustained,S1 and S2 within normal limits, no S3, no S4, no clicks, no rubs, no murmurs ABD:  Flat, positive bowel sounds normal in frequency in pitch, no bruits, no rebound, no guarding, no midline pulsatile mass, no hepatomegaly, no splenomegaly EXT:  2 plus pulses throughout, no edema, no cyanosis no clubbing    ASSESSMENT AND PLAN  CHEST PAIN:   He is no longer having this.  No further work up is planned.  He will continue with risk reduction.   HTN:  The blood pressure is at target. No change in medications is indicated. We will  continue with therapeutic lifestyle changes (TLC).  He is going to start taking the Norvasc at night to see if this helps with even distribution of meds (ACE in the AM) and better BP control although we are close to target.   CAD:   The patient has no new sypmtoms.  No further cardiovascular testing is indicated.  We will continue with aggressive risk reduction and meds as listed.  DYSLIPIDEMIA:  He needs a fasting lipid profile.  FATIGUE:  He had a normal TSH at the last visit. He is not sleeping.  He still does not have a PCP to address his insomnia.  He did not have response to melatonin.  I have suggested benadryl.  He needs to have a PCP to discuss ths.

## 2016-08-25 ENCOUNTER — Encounter: Payer: Self-pay | Admitting: Cardiology

## 2016-08-25 ENCOUNTER — Ambulatory Visit (INDEPENDENT_AMBULATORY_CARE_PROVIDER_SITE_OTHER): Payer: Medicare Other | Admitting: Cardiology

## 2016-08-25 VITALS — BP 151/81 | HR 84 | Ht 70.0 in | Wt 170.0 lb

## 2016-08-25 DIAGNOSIS — E785 Hyperlipidemia, unspecified: Secondary | ICD-10-CM

## 2016-08-25 LAB — LIPID PANEL
CHOL/HDL RATIO: 1.6 ratio (ref 0.0–5.0)
Cholesterol, Total: 204 mg/dL — ABNORMAL HIGH (ref 100–199)
HDL: 125 mg/dL (ref >39)
LDL Calculated: 70 mg/dL (ref 0–99)
TRIGLYCERIDES: 46 mg/dL (ref 0–149)
VLDL Cholesterol Cal: 9 mg/dL (ref 5–40)

## 2016-08-25 NOTE — Patient Instructions (Signed)
Medication Instructions:  Continue current medications  Labwork: Fasting Lipids  Testing/Procedures: None Ordered  Follow-Up: Your physician recommends that you schedule a follow-up appointment in: 3 Months.   Any Other Special Instructions Will Be Listed Below (If Applicable).   If you need a refill on your cardiac medications before your next appointment, please call your pharmacy.

## 2016-10-02 ENCOUNTER — Encounter: Payer: BC Managed Care – PPO | Admitting: Adult Health

## 2016-10-06 ENCOUNTER — Telehealth: Payer: Self-pay | Admitting: *Deleted

## 2016-10-06 NOTE — Telephone Encounter (Signed)
"  [name] calling to check on appointment(s) scheduled for my dad with Dr. Delice Bison."  Provided 10-07-2016, 8:30 am Long Term Survivorship scheduling information.  No further questions.

## 2016-10-07 ENCOUNTER — Ambulatory Visit (HOSPITAL_BASED_OUTPATIENT_CLINIC_OR_DEPARTMENT_OTHER): Payer: Medicare Other | Admitting: Adult Health

## 2016-10-07 ENCOUNTER — Encounter: Payer: Self-pay | Admitting: Adult Health

## 2016-10-07 ENCOUNTER — Encounter: Payer: Self-pay | Admitting: Family Medicine

## 2016-10-07 ENCOUNTER — Ambulatory Visit (INDEPENDENT_AMBULATORY_CARE_PROVIDER_SITE_OTHER): Payer: Medicare Other | Admitting: Family Medicine

## 2016-10-07 ENCOUNTER — Encounter: Payer: Self-pay | Admitting: Surgical

## 2016-10-07 VITALS — BP 166/69 | HR 86 | Temp 97.6°F | Resp 18 | Ht 70.0 in | Wt 166.7 lb

## 2016-10-07 VITALS — BP 132/68 | HR 69 | Temp 98.2°F | Ht 70.0 in | Wt 167.4 lb

## 2016-10-07 DIAGNOSIS — E785 Hyperlipidemia, unspecified: Secondary | ICD-10-CM | POA: Diagnosis not present

## 2016-10-07 DIAGNOSIS — Z789 Other specified health status: Secondary | ICD-10-CM

## 2016-10-07 DIAGNOSIS — R5383 Other fatigue: Secondary | ICD-10-CM | POA: Diagnosis not present

## 2016-10-07 DIAGNOSIS — F419 Anxiety disorder, unspecified: Secondary | ICD-10-CM | POA: Diagnosis not present

## 2016-10-07 DIAGNOSIS — R7309 Other abnormal glucose: Secondary | ICD-10-CM | POA: Diagnosis not present

## 2016-10-07 DIAGNOSIS — Z85118 Personal history of other malignant neoplasm of bronchus and lung: Secondary | ICD-10-CM | POA: Diagnosis not present

## 2016-10-07 DIAGNOSIS — N183 Chronic kidney disease, stage 3 unspecified: Secondary | ICD-10-CM

## 2016-10-07 DIAGNOSIS — C3492 Malignant neoplasm of unspecified part of left bronchus or lung: Secondary | ICD-10-CM

## 2016-10-07 DIAGNOSIS — G479 Sleep disorder, unspecified: Secondary | ICD-10-CM

## 2016-10-07 DIAGNOSIS — Z23 Encounter for immunization: Secondary | ICD-10-CM | POA: Diagnosis not present

## 2016-10-07 LAB — COMPREHENSIVE METABOLIC PANEL
ALT: 11 U/L (ref 0–53)
AST: 20 U/L (ref 0–37)
Albumin: 4.4 g/dL (ref 3.5–5.2)
Alkaline Phosphatase: 41 U/L (ref 39–117)
BUN: 18 mg/dL (ref 6–23)
CO2: 28 mEq/L (ref 19–32)
Calcium: 10.1 mg/dL (ref 8.4–10.5)
Chloride: 95 mEq/L — ABNORMAL LOW (ref 96–112)
Creatinine, Ser: 1.4 mg/dL (ref 0.40–1.50)
GFR: 51.8 mL/min — ABNORMAL LOW (ref >60.00)
Glucose, Bld: 278 mg/dL — ABNORMAL HIGH (ref 70–99)
Potassium: 4.8 mEq/L (ref 3.5–5.1)
Sodium: 128 mEq/L — ABNORMAL LOW (ref 135–145)
Total Bilirubin: 0.5 mg/dL (ref 0.2–1.2)
Total Protein: 8.4 g/dL — ABNORMAL HIGH (ref 6.0–8.3)

## 2016-10-07 LAB — HEMOGLOBIN A1C: Hgb A1c MFr Bld: 5.3 % (ref 4.6–6.5)

## 2016-10-07 MED ORDER — ALPRAZOLAM 0.5 MG PO TABS: 0.5000 mg | ORAL_TABLET | Freq: Every evening | ORAL | 0 refills | Status: DC | PRN

## 2016-10-07 NOTE — Progress Notes (Signed)
[name] [name] is a 82 y.o. male is here to [facility].   Patient Care Team: Briscoe Deutscher, DO as PCP - General (Family Medicine)   History of Present Illness:   [name] CMA acting as scribe for Dr. Juleen China.  HPI: Patient comes in today to establish care. Patient comes in with his daughter for concerns of not sleeping well and for anxiety. SEE AP for more details.  Health Maintenance Due  Topic Date Due  . TETANUS/TDAP  07/22/1953  . PNA vac Low Risk Adult (1 of 2 - PCV13) 07/21/2001  . INFLUENZA VACCINE  09/17/2016   PMHx, SurgHx, SocialHx, Medications, and Allergies were reviewed in the Visit Navigator and updated as appropriate.   Past Medical History:  Diagnosis Date  . BRADYCARDIA   . CAD   . DYSLIPIDEMIA   . HYPERTENSION   . POSTSURG PERCUT TRANSLUMINAL COR ANGPLSTY STS    LAD 50%, mid RCA 90% treated with BMS 2008  . SLEEP DISORDER   . Squamous cell lung cancer ([place]) 2006    Past Surgical History:  Procedure Laterality Date  . CATARACT EXTRACTION, BILATERAL  2013  . Coronary artery stent  2008  . PNEUMONECTOMY     chemotherapy in 2006    Family History  Problem Relation Age of Onset  . Heart attack Mother 36  . Heart attack Father        in 49s  . Hyperlipidemia Brother    Social History  Substance Use Topics  . Smoking status: Former Smoker    Quit date: 02/18/1996  . Smokeless tobacco: Never Used  . Alcohol use 21.0 oz/week    35 Cans of beer per week     Comment: 2-3 Beer night    Current Medications and Allergies:   .  aspirin 81 MG tablet, Take 81 mg by mouth daily., Disp: , Rfl:  .  lisinopril (PRINIVIL,ZESTRIL) 40 MG tablet, Take 1 tablet (40 mg total) by mouth daily. PLEASE MAKE APPOINTMENT FOR FURTHER REFILLS ,1ST ATTEMPT, Disp: 90 tablet, Rfl: 0 .  pravastatin (PRAVACHOL) 80 MG tablet, Take 1 tablet (80 mg total) by mouth daily. PLEASE MAKE APPOINTMENT FOR FURTHER REFILLS , 1ST ATTEMPT, Disp: 90 tablet, Rfl: 3 .  ranitidine  (ZANTAC) 75 MG tablet, Take 75 mg by mouth as needed for heartburn., Disp: , Rfl:  .  amLODipine (NORVASC) 10 MG tablet, Take 1 tablet (10 mg total) by mouth daily., Disp: 90 tablet, Rfl: 3 .  nitroGLYCERIN (NITROSTAT) 0.4 MG SL tablet, Place 1 tablet (0.4 mg total) under the tongue every 5 (five) minutes as needed for chest pain., Disp: 90 tablet, Rfl: 3  No Known Allergies   Review of Systems:   Pertinent items are noted in the HPI. Otherwise, ROS is negative.  Vitals:   Vitals:   10/07/16 1050  BP: 132/68  Pulse: 69  Temp: 98.2 F (36.8 C)  TempSrc: Oral  SpO2: 96%  Weight: 167 lb 6.4 oz (75.9 kg)  Height: 5\' 10"  (1.778 m)     Body mass index is 24.02 kg/m. Physical Exam:   Physical Exam  Constitutional: He is oriented to person, place, and time. He appears well-developed and well-nourished. No distress.  HENT:  Head: Normocephalic and atraumatic.  Right Ear: External ear normal.  Left Ear: External ear normal.  Nose: Nose normal.  Mouth/Throat: Oropharynx is clear and moist.  Eyes: Pupils are equal, round, and reactive to light. Conjunctivae and EOM are normal.  Neck:  Normal range of motion. Neck supple.  Cardiovascular: Normal rate, regular rhythm, normal heart sounds and intact distal pulses.   Pulmonary/Chest: Effort normal and breath sounds normal.  Abdominal: Soft. Bowel sounds are normal.  Musculoskeletal: Normal range of motion.  Neurological: He is alert and oriented to person, place, and time.  Skin: Skin is warm and dry.  Psychiatric: He has a normal mood and affect. His behavior is normal. Judgment and thought content normal.  Nursing note and vitals reviewed.  Results for orders placed or performed in visit on 10/07/16  Hemoglobin A1c  Result Value Ref Range   Hgb A1c MFr Bld 5.3 4.6 - 6.5 %  Comprehensive metabolic panel  Result Value Ref Range   Sodium 128 (L) 135 - 145 mEq/L   Potassium 4.8 3.5 - 5.1 mEq/L   Chloride 95 (L) 96 - 112 mEq/L    CO2 28 19 - 32 mEq/L   Glucose, Bld 278 (H) 70 - 99 mg/dL   BUN 18 6 - 23 mg/dL   Creatinine, Ser 1.40 0.40 - 1.50 mg/dL   Total Bilirubin 0.5 0.2 - 1.2 mg/dL   Alkaline Phosphatase 41 39 - 117 U/L   AST 20 0 - 37 U/L   ALT 11 0 - 53 U/L   Total Protein 8.4 (H) 6.0 - 8.3 g/dL   Albumin 4.4 3.5 - 5.2 g/dL   Calcium 10.1 8.4 - 10.5 mg/dL   GFR 51.80 (L) >60.00 mL/min   Assessment and Plan:   Anxiety: Patients daughter had given patient a Xanax 0.5 mg while up [place] while visiting his brother. Patient stated that when he took that he slept the best he had ever slept. When he took the Xanax he had not drank any alcohol that night. Discussed with the patient that it can be dangerous for him to take Xanax and drink as much alcohol. Discussed cutting down to 2 beers a night if he takes the xanax. Would like for him to cut back to just every once in awhile.   Sleep disorder: Patient states that he has a hard time falling asleep. He gets up multiple times during the night to urinate. Patient has never had any problems with prostate. Patient stated that he drinks 4-5 cans of beer in the evening. Discussed that the alcohol could be causing the sleep disorder.   Diagnoses and all orders for this visit:  Anxiety Comments: Lives alone since wife passed. Hx of lung cancer. Using beer to medicate. Orders: -     ALPRAZolam (XANAX) 0.5 MG tablet; Take 1 tablet (0.5 mg total) by mouth at bedtime as needed for anxiety. Take a half a tablet as needed for sleep and anxiety. -     Comprehensive metabolic panel  Fatigue, unspecified type -     Hemoglobin A1c -     Comprehensive metabolic panel  Elevated glucose Comments: Glucose very high today. Not fasting. A1c in normal range. Will monitor closely. 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  Alcohol consumption of more than two drinks per day Comments: Discussed the importance of cessation and came up with a plan to wean over the next  few months. He is aware that I will NOT prescribe the benzo if he continues to drink and that this is to help him stop.   CKD (chronic kidney disease) stage 3, GFR 30-59 ml/min Comments:  Lab Results  Component Value Date   CREATININE 1.40 10/07/2016   CREATININE 1.37 (H) 06/09/2016   CREATININE 1.2 10/01/2015    . Reviewed expectations re: course of current medical issues. . Discussed self-management of symptoms. . Outlined signs and symptoms indicating need for more acute intervention. . Patient verbalized understanding and all questions were answered. Marland Kitchen Health Maintenance issues including appropriate healthy diet, exercise, and smoking avoidance were discussed with patient. . See orders for this visit as documented in the electronic medical record. . Patient received an After Visit Summary.  CMA served as Education administrator during this visit. History, Physical, and Plan performed by medical provider. The above documentation has been reviewed and is accurate and complete. Briscoe Deutscher, D.O.  Briscoe Deutscher, DO [place] [place] 10/10/2016  Future Appointments Date Time Provider [place]  10/13/2016 8:30 AM [facility] [place]  11/07/2016 8:15 AM Briscoe Deutscher, DO [facility] None  11/24/2016 8:00 AM Minus Breeding, MD [facility]  10/08/2017 8:30 AM [place], Charlestine Massed, NP [facility] None

## 2016-10-07 NOTE — Progress Notes (Signed)
CLINIC:  Survivorship  REASON FOR VISIT:  Long-term survivorship surveillance visit for patient with history of lung cancer.   BRIEF ONCOLOGIC HISTORY:  Per Dr. 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   #1 status post 3 cycles of neoadjuvant chemotherapy with cisplatin and docetaxel. Last dose was given 12/20/2004 was significant response to the treatment.   #2 status post left pneumonectomy under the care of Dr. Arlyce Dice on 02/21/2005 and the final pathology was consistent with a stage II B. (T2, N1, MX) non-small cell lung cancer.   #3 status post 3 cycles of adjuvant chemotherapy with carboplatin and paclitaxel. Last dose was given 05/20/2005.    INTERVAL HISTORY:  [name] [name] is here today for evaluation of his history of lung cancer.  He is doing well today.  He says his only problem is challenges with sleeping.  He is going to see another doctor today about this, he isn't sure who the doctor is.      ADDITIONAL REVIEW OF SYSTEMS:  Review of Systems  Constitutional: Negative for chills, fever, malaise/fatigue and weight loss.  HENT: Negative for hearing loss and tinnitus.   Eyes: Negative for blurred vision and double vision.  Respiratory: Negative for cough, hemoptysis and shortness of breath.   Cardiovascular: Negative for chest pain, palpitations and leg swelling.  Gastrointestinal: Negative for abdominal pain, constipation, diarrhea, heartburn, nausea and vomiting.  Genitourinary: Negative for dysuria.  Skin: Negative for itching and rash.  Neurological: Negative for dizziness, seizures and headaches.  Endo/Heme/Allergies: Negative for environmental allergies. Does not bruise/bleed easily.  Psychiatric/Behavioral: Negative for depression. The patient has insomnia. The patient is not  nervous/anxious.      PAST MEDICAL & SURGICAL HISTORY:  Past Medical History:  Diagnosis Date  . BRADYCARDIA   . CAD   . DYSLIPIDEMIA   . HYPERTENSION   . POSTSURG PERCUT TRANSLUMINAL COR ANGPLSTY STS    LAD 50%, mid RCA 90% treated with BMS 2008  . SLEEP DISORDER   . Squamous cell lung cancer ([place]) 2006   Past Surgical History:  Procedure Laterality Date  . CATARACT EXTRACTION, BILATERAL  2013  . Coronary artery stent  2008  . PNEUMONECTOMY     chemotherapy in 2006    SOCIAL HISTORY:  [name] [name] lives alone in [place], [place].  He is retired.  His 3 daughters help him and they live in [place], [place].  [name] [name] he says helps him the most.  He is retired.     CURRENT MEDICATIONS:  Current Outpatient Prescriptions on File Prior to Visit  Medication Sig Dispense Refill  . aspirin 81 MG tablet Take 81 mg by mouth daily.    Marland Kitchen lisinopril (PRINIVIL,ZESTRIL) 40 MG tablet Take 1 tablet (40 mg total) by mouth daily. PLEASE MAKE APPOINTMENT FOR FURTHER REFILLS ,1ST ATTEMPT 90 tablet 0  . pravastatin (PRAVACHOL) 80 MG tablet Take 1 tablet (80 mg total) by mouth daily. PLEASE MAKE APPOINTMENT FOR FURTHER REFILLS , 1ST ATTEMPT 90 tablet 3  . amLODipine (NORVASC) 10 MG tablet Take 1 tablet (10 mg total) by mouth daily. 90 tablet 3  . nitroGLYCERIN (NITROSTAT) 0.4 MG SL tablet Place 1 tablet (0.4 mg total) under the tongue every 5 (five) minutes as needed for chest pain. 90 tablet 3   No current facility-administered medications on file prior to visit.  ALLERGIES:  No Known Allergies  PHYSICAL EXAM:  Vitals:   10/07/16 0830  BP: (!) 166/69  Pulse: 86  Resp: 18  Temp: 97.6 F (36.4 C)  SpO2: 100%   Filed Weights   10/07/16 0830  Weight: 166 lb 11.2 oz (75.6 kg)    General: Well-nourished, well-appearing older male in no acute distress. Unaccompanied today.  HEENT: Head is atraumatic and normocephalic.  Pupils equal and reactive to light. Conjunctivae clear without exudate.  Sclerae  anicteric. Oral mucosa is pink and moist without lesions. Oropharynx is pink and moist, without lesions. Lymph: No cervical, supraclavicular, or supraclavicular lymphadenopathy noted on palpation.   Cardiovascular: Normal rate and rhythm. Respiratory: Clear to auscultation in right lung, left lung diminished (s/p pneumonectomy).  GI: Abdomen soft and round. No tenderness to palpation. Bowel sounds normoactive in 4 quadrants. No hepatosplenomegaly.  GU: Deferred.   Neuro: No focal deficits. Steady gait.   Psych: Normal mood and affect for situation. Extremities: No edema, cyanosis, or clubbing.   Skin: Warm and dry.    LABORATORY DATA:  None at this visit.  DIAGNOSTIC IMAGING:  None at this visit.    ASSESSMENT & PLAN:  Mr. Randazzo is a pleasant 81 y.o. male with history of left stage IIIA non small cell lung cancer treated with neoadjuvant chemotherapy, pneumonectomy, and adjuvant chemotherapy; completed treatment on 05/20/2005.  Patient presents to survivorship clinic today for routine surveillance as a long-term cancer survivor.   1. History of lung cancer: [name] [name] is without any clinical evidence of disease recurrence.  He did undergo Chest xray in 05/2016 that was normal.  He is due for CT chest w/o contrast and I have ordered this today.  He requested that I call his daughter with the results once this gets scheduled.  Her name is [name] [name] and her phone number is [phone].  He will follow up in one year for an LTS visit.   2. Health maintenance: [name] [name] is doing a good job with seeing his PCP.  I recommended he continue to regularly follow with his PCP, undergo routine skin examinations, and eat a diet rich in fruits and vegetables, and with decreased amounts of processed foods and meats.  I recommended that he exercise as tolerated and under the guidance of his PCP.    The above was reviewed with [facility] in detail.  He verbalized understanding.  He knows to call for any questions or concerns.     Dispo:  CT chest w/o contrast LTS follow up in one year  A total of (30) minutes of face-to-face time was spent with this patient with greater than 50% of that time in counseling and care-coordination.   Gardenia Phlegm, [place] [phone]

## 2016-10-09 ENCOUNTER — Telehealth: Payer: Self-pay | Admitting: Family Medicine

## 2016-10-09 NOTE — Telephone Encounter (Signed)
Left pt message asking to call Allison back directly at [phone] to schedule AWV + labs with Cassie and CPE with PCP. ° °*NOTE* No hx of AWV °

## 2016-10-10 DIAGNOSIS — N183 Chronic kidney disease, stage 3 unspecified: Secondary | ICD-10-CM

## 2016-10-10 DIAGNOSIS — F419 Anxiety disorder, unspecified: Secondary | ICD-10-CM

## 2016-10-10 DIAGNOSIS — Z789 Other specified health status: Secondary | ICD-10-CM | POA: Insufficient documentation

## 2016-10-10 HISTORY — DX: Anxiety disorder, unspecified: F41.9

## 2016-10-10 HISTORY — DX: Chronic kidney disease, stage 3 unspecified: N18.30

## 2016-10-13 ENCOUNTER — Ambulatory Visit (HOSPITAL_COMMUNITY)
Admission: RE | Admit: 2016-10-13 | Discharge: 2016-10-13 | Disposition: A | Discharge status: Home / Self Care | Payer: Medicare Other | Source: Ambulatory Visit | Attending: Adult Health | Admitting: Adult Health

## 2016-10-13 ENCOUNTER — Encounter (HOSPITAL_COMMUNITY): Payer: Self-pay

## 2016-10-13 DIAGNOSIS — C3490 Malignant neoplasm of unspecified part of unspecified bronchus or lung: Secondary | ICD-10-CM | POA: Diagnosis not present

## 2016-10-13 DIAGNOSIS — I700 Atherosclerosis of aorta: Secondary | ICD-10-CM | POA: Diagnosis not present

## 2016-10-13 DIAGNOSIS — J439 Emphysema, unspecified: Secondary | ICD-10-CM | POA: Diagnosis not present

## 2016-10-13 DIAGNOSIS — I2510 Atherosclerotic heart disease of native coronary artery without angina pectoris: Secondary | ICD-10-CM | POA: Insufficient documentation

## 2016-10-13 DIAGNOSIS — C3492 Malignant neoplasm of unspecified part of left bronchus or lung: Secondary | ICD-10-CM

## 2016-10-13 NOTE — Telephone Encounter (Signed)
Have called patient twice on both numbers listed and did not get an answer.   [name] (daughter) was listed in chart as a contact and ok to release information concerning patient. This nurse told daughter that patient's CT scan was normal and she stated she would let her father (patient) know.  She explained that sometimes he was hard to get a hold of on the phone.

## 2016-10-13 NOTE — Telephone Encounter (Signed)
-----   Message from Gardenia Phlegm, NP sent at 10/13/2016 11:51 AM EDT ----- Please tell patient his CT scan was normal ----- Message ----- From: Interface, Rad Results In Sent: 10/13/2016  10:45 AM To: Gardenia Phlegm, NP

## 2016-11-03 ENCOUNTER — Encounter: Payer: Self-pay | Admitting: Family Medicine

## 2016-11-03 ENCOUNTER — Telehealth: Payer: Self-pay | Admitting: Family Medicine

## 2016-11-03 ENCOUNTER — Ambulatory Visit (INDEPENDENT_AMBULATORY_CARE_PROVIDER_SITE_OTHER): Payer: Medicare Other | Admitting: Family Medicine

## 2016-11-03 ENCOUNTER — Ambulatory Visit: Payer: Medicare Other | Admitting: *Deleted

## 2016-11-03 VITALS — BP 124/74 | HR 59 | Temp 97.8°F | Ht 70.0 in | Wt 169.6 lb

## 2016-11-03 DIAGNOSIS — R7309 Other abnormal glucose: Secondary | ICD-10-CM | POA: Diagnosis not present

## 2016-11-03 DIAGNOSIS — N183 Chronic kidney disease, stage 3 unspecified: Secondary | ICD-10-CM

## 2016-11-03 DIAGNOSIS — F419 Anxiety disorder, unspecified: Secondary | ICD-10-CM | POA: Diagnosis not present

## 2016-11-03 LAB — COMPREHENSIVE METABOLIC PANEL
ALT: 9 U/L (ref 0–53)
AST: 18 U/L (ref 0–37)
Albumin: 4.5 g/dL (ref 3.5–5.2)
Alkaline Phosphatase: 47 U/L (ref 39–117)
BUN: 22 mg/dL (ref 6–23)
CO2: 25 mEq/L (ref 19–32)
Calcium: 10.2 mg/dL (ref 8.4–10.5)
Chloride: 98 mEq/L (ref 96–112)
Creatinine, Ser: 1.27 mg/dL (ref 0.40–1.50)
GFR: 57.95 mL/min — ABNORMAL LOW (ref >60.00)
Glucose, Bld: 99 mg/dL (ref 70–99)
Potassium: 4.7 mEq/L (ref 3.5–5.1)
Sodium: 132 mEq/L — ABNORMAL LOW (ref 135–145)
Total Bilirubin: 0.7 mg/dL (ref 0.2–1.2)
Total Protein: 7.8 g/dL (ref 6.0–8.3)

## 2016-11-03 LAB — HEMOGLOBIN A1C: Hgb A1c MFr Bld: 5.2 % (ref 4.6–6.5)

## 2016-11-03 MED ORDER — ALPRAZOLAM 0.5 MG PO TABS: 0.5000 mg | ORAL_TABLET | Freq: Every evening | ORAL | 0 refills | Status: DC | PRN

## 2016-11-03 NOTE — Progress Notes (Signed)
[name] [name] is a 82 y.o. male is here for follow up.  History of Present Illness:   [name] [name] CMA acting as scribe for Dr. Juleen China.  HPI: Patient comes in today for follow up on his Anxiety. Patient has been taking one tablet of the Xanax instead of the half tablet prescribed. Patient states that he has also cut back on his drinking and is only drinking two drinks a day. Neurological ROS: negative for - behavioral changes, confusion, dizziness, headaches, memory loss or numbness/tingling.  Health Maintenance Due  Topic Date Due  . TETANUS/TDAP  07/22/1953  . PNA vac Low Risk Adult (1 of 2 - PCV13) 07/21/2001  . INFLUENZA VACCINE  09/17/2016   Depression screen PHQ 2/9 10/07/2016  Decreased Interest 0  Down, Depressed, Hopeless 0  PHQ - 2 Score 0   PMHx, SurgHx, SocialHx, FamHx, Medications, and Allergies were reviewed in the Visit Navigator and updated as appropriate.   Patient Active Problem List   Diagnosis Date Noted  . CKD (chronic kidney disease) stage 3, GFR 30-59 ml/min 10/10/2016  . Alcohol consumption of more than two drinks per day 10/10/2016  . Anxiety 10/10/2016  . Elevated glucose 08/08/2011  . Disturbance in sleep behavior 02/08/2009  . Cancer of left lung parenchyma ([place]) 04/23/2008  . HLD (hyperlipidemia) 04/23/2008  . Essential hypertension 03/18/2006  . POSTSURG PERCUT TRANSLUMINAL COR ANGPLSTY STS 03/17/2006  . Coronary atherosclerosis 03/13/2006   Social History  Substance Use Topics  . Smoking status: Former Smoker    Quit date: 02/18/1996  . Smokeless tobacco: Never Used  . Alcohol use 21.0 oz/week    35 Cans of beer per week     Comment: 2-3 Beer night   Current Medications and Allergies:   .  ALPRAZolam (XANAX) 0.5 MG tablet, Take 1 tablet (0.5 mg total) by mouth at bedtime as needed for anxiety. Take a half a tablet as needed for sleep and anxiety., Disp: 30 tablet, Rfl: 0 .  aspirin 81 MG tablet, Take 81 mg by mouth daily., Disp: , Rfl:    .  lisinopril (PRINIVIL,ZESTRIL) 40 MG tablet, Take 1 tablet (40 mg total) by mouth daily. PLEASE MAKE APPOINTMENT FOR FURTHER REFILLS ,1ST ATTEMPT, Disp: 90 tablet, Rfl: 0 .  pravastatin (PRAVACHOL) 80 MG tablet, Take 1 tablet (80 mg total) by mouth daily. PLEASE MAKE APPOINTMENT FOR FURTHER REFILLS , 1ST ATTEMPT, Disp: 90 tablet, Rfl: 3 .  ranitidine (ZANTAC) 75 MG tablet, Take 75 mg by mouth as needed for heartburn., Disp: , Rfl:  .  amLODipine (NORVASC) 10 MG tablet, Take 1 tablet (10 mg total) by mouth daily., Disp: 90 tablet, Rfl: 3 .  nitroGLYCERIN (NITROSTAT) 0.4 MG SL tablet, Place 1 tablet (0.4 mg total) under the tongue every 5 (five) minutes as needed for chest pain., Disp: 90 tablet, Rfl: 3  No Known Allergies   Review of Systems   Pertinent items are noted in the HPI. Otherwise, ROS is negative.  Vitals:   Vitals:   11/03/16 0955  BP: 124/74  Pulse: (!) 59  Temp: 97.8 F (36.6 C)  TempSrc: Oral  SpO2: 95%  Weight: 169 lb 9.6 oz (76.9 kg)  Height: 5\' 10"  (1.778 m)     Body mass index is 24.34 kg/m.   Physical Exam:   Physical Exam  Constitutional: He is oriented to person, place, and time. He appears well-developed and well-nourished. No distress.  HENT:  Head: Normocephalic and atraumatic.  Right Ear: External  ear normal.  Left Ear: External ear normal.  Nose: Nose normal.  Mouth/Throat: Oropharynx is clear and moist.  Eyes: Pupils are equal, round, and reactive to light. Conjunctivae and EOM are normal.  Neck: Normal range of motion. Neck supple.  Cardiovascular: Normal rate, regular rhythm, normal heart sounds and intact distal pulses.   Pulmonary/Chest: Effort normal and breath sounds normal.  Abdominal: Soft. Bowel sounds are normal.  Musculoskeletal: Normal range of motion.  Neurological: He is alert and oriented to person, place, and time.  Skin: Skin is warm and dry.  Psychiatric: He has a normal mood and affect. His behavior is normal. Judgment  and thought content normal.  Nursing note and vitals reviewed.   Results for orders placed or performed in visit on 11/03/16  Comprehensive metabolic panel  Result Value Ref Range   Sodium 132 (L) 135 - 145 mEq/L   Potassium 4.7 3.5 - 5.1 mEq/L   Chloride 98 96 - 112 mEq/L   CO2 25 19 - 32 mEq/L   Glucose, Bld 99 70 - 99 mg/dL   BUN 22 6 - 23 mg/dL   Creatinine, Ser 1.27 0.40 - 1.50 mg/dL   Total Bilirubin 0.7 0.2 - 1.2 mg/dL   Alkaline Phosphatase 47 39 - 117 U/L   AST 18 0 - 37 U/L   ALT 9 0 - 53 U/L   Total Protein 7.8 6.0 - 8.3 g/dL   Albumin 4.5 3.5 - 5.2 g/dL   Calcium 10.2 8.4 - 10.5 mg/dL   GFR 57.95 (L) >60.00 mL/min  Hemoglobin A1c  Result Value Ref Range   Hgb A1c MFr Bld 5.2 4.6 - 6.5 %   Assessment and Plan:   [name] was seen today for follow-up.  Diagnoses and all orders for this visit:  Anxiety Comments: Okay current dose. We'll prescribe 2 month refill. Orders: -     ALPRAZolam (XANAX) 0.5 MG tablet; Take 1 tablet (0.5 mg total) by mouth at bedtime as needed for anxiety. Take a half a tablet as needed for sleep and anxiety.  CKD (chronic kidney disease) stage 3, GFR 30-59 ml/min -     Comprehensive metabolic panel  Elevated glucose -     Hemoglobin A1c   . Reviewed expectations re: course of current medical issues. . Discussed self-management of symptoms. . Outlined signs and symptoms indicating need for more acute intervention. . Patient verbalized understanding and all questions were answered. Marland Kitchen Health Maintenance issues including appropriate healthy diet, exercise, and smoking avoidance were discussed with patient. . See orders for this visit as documented in the electronic medical record. . Patient received an After Visit Summary.  CMA served as Education administrator during this visit. History, Physical, and Plan performed by medical provider. The above documentation has been reviewed and is accurate and complete. Briscoe Deutscher, D.O.  Briscoe Deutscher,  DO [place], [place] 11/08/2016  Future Appointments Date Time Provider [place]  11/24/2016 8:00 AM Minus Breeding, MD [facility]  12/03/2016 10:00 AM Stephanie Acre, RN [facility] None  02/02/2017 9:15 AM Briscoe Deutscher, DO [facility] None  10/08/2017 8:30 AM Causey, Charlestine Massed, NP [facility] None

## 2016-11-03 NOTE — Telephone Encounter (Signed)
Called pharmacy and gave clarification on RX.

## 2016-11-03 NOTE — Telephone Encounter (Signed)
[employer] calling in reference to needing clarification on ALPRAZolam (XANAX) 0.5 MG tablet. Please call [employer] and advise.   Phone [phone]  Ref. Number [id number]

## 2016-11-07 ENCOUNTER — Ambulatory Visit: Payer: Medicare Other | Admitting: Family Medicine

## 2016-11-24 ENCOUNTER — Ambulatory Visit: Payer: Medicare Other | Admitting: Cardiology

## 2016-12-02 NOTE — Progress Notes (Signed)
Pre visit review using our clinic review tool, if applicable. No additional management support is needed unless otherwise documented below in the visit note. 

## 2016-12-02 NOTE — Progress Notes (Signed)
Subjective:   [name] [name] is a 82 y.o. male who presents for Medicare Annual/Subsequent preventive examination.  Review of Systems:  No ROS.  Medicare Wellness Visit. Additional risk factors are reflected in the social history.  Cardiac Risk Factors include: advanced age (>28men, >100 women);dyslipidemia;hypertension;male gender     Objective:    Vitals: BP 136/66 (BP Location: Left Arm, Patient Position: Sitting, Cuff Size: Normal)   Pulse 65   Resp 16   Ht 5\' 10"  (1.778 m)   Wt 172 lb 6.4 oz (78.2 kg)   SpO2 98%   BMI 24.74 kg/m   Body mass index is 24.74 kg/m.  Tobacco History  Smoking Status  . Former Smoker  . Packs/day: 1.50  . Years: 20.00  . Types: Cigarettes  . Quit date: 02/18/1996  Smokeless Tobacco  . Never Used     Counseling given: Not Answered   Past Medical History:  Diagnosis Date  . BRADYCARDIA   . CAD   . DYSLIPIDEMIA   . HYPERTENSION   . POSTSURG PERCUT TRANSLUMINAL COR ANGPLSTY STS    LAD 50%, mid RCA 90% treated with BMS 2008  . SLEEP DISORDER   . Squamous cell lung cancer ([place]) 2006   Past Surgical History:  Procedure Laterality Date  . CATARACT EXTRACTION, BILATERAL  2013  . Coronary artery stent  2008  . PNEUMONECTOMY     chemotherapy in 2006   Family History  Problem Relation Age of Onset  . Heart attack Mother 26  . Heart attack Father        in 37s  . Hyperlipidemia Brother    History  Sexual Activity  . Sexual activity: Not on file    Outpatient Encounter Prescriptions as of 12/03/2016  Medication Sig  . ALPRAZolam (XANAX) 0.5 MG tablet Take 1 tablet (0.5 mg total) by mouth at bedtime as needed for anxiety. Take a half a tablet as needed for sleep and anxiety.  Marland Kitchen aspirin 81 MG tablet Take 81 mg by mouth daily.  Marland Kitchen lisinopril (PRINIVIL,ZESTRIL) 40 MG tablet Take 1 tablet (40 mg total) by mouth daily. PLEASE MAKE APPOINTMENT FOR FURTHER REFILLS ,1ST ATTEMPT  . pravastatin (PRAVACHOL) 80 MG tablet Take 1 tablet (80 mg  total) by mouth daily. PLEASE MAKE APPOINTMENT FOR FURTHER REFILLS , 1ST ATTEMPT  . ranitidine (ZANTAC) 75 MG tablet Take 75 mg by mouth as needed for heartburn.  Marland Kitchen amLODipine (NORVASC) 10 MG tablet Take 1 tablet (10 mg total) by mouth daily.  . nitroGLYCERIN (NITROSTAT) 0.4 MG SL tablet Place 1 tablet (0.4 mg total) under the tongue every 5 (five) minutes as needed for chest pain.   No facility-administered encounter medications on file as of 12/03/2016.     Activities of Daily Living In your present state of health, do you have any difficulty performing the following activities: 12/03/2016  Hearing? N  Vision? N  Difficulty concentrating or making decisions? N  Walking or climbing stairs? N  Dressing or bathing? N  Doing errands, shopping? N  Preparing Food and eating ? N  Using the Toilet? N  In the past six months, have you accidently leaked urine? N  Do you have problems with loss of bowel control? N  Managing your Medications? N  Managing your Finances? N  Housekeeping or managing your Housekeeping? N  Some recent data might be hidden    Patient Care Team: Briscoe Deutscher, DO as PCP - General (Family Medicine) Minus Breeding, MD as Consulting Physician (  Cardiology) Delice Bison Charlestine Massed, NP as Nurse Practitioner (Hematology and Oncology)   Assessment:    Physical assessment deferred to PCP.  Exercise Activities and Dietary recommendations Current Exercise Habits: The patient does not participate in regular exercise at present, Exercise limited by: None identified  Goals    . Maintain current health status      Fall Risk Fall Risk  12/03/2016 10/07/2016  Falls in the past year? No No   Depression Screen PHQ 2/9 Scores 12/03/2016 10/07/2016  PHQ - 2 Score 0 0    Cognitive Function Pt cannot read or spell, had a difficult time remembering the month.   MMSE - Mini Mental State Exam 12/03/2016  Not completed: Unable to complete  Attention/ Calculation (No  Data)  Attention/Calculation-comments Cannot spell  Ad8 score reviewed for issues:  Issues making decisions:no  Less interest in hobbies / activities: no  Repeats questions, stories (family complaining): no  Trouble using ordinary gadgets (microwave, computer, phone): no  Forgets the month or year: no  Mismanaging finances:  no  Remembering appts: no  Daily problems with thinking and/or memory:no Ad8 score is=0          Immunization History  Administered Date(s) Administered  . Influenza Whole 02/08/2009, 11/26/2009  . Influenza,inj,Quad PF,6+ Mos 12/02/2013  . Influenza-Unspecified 10/07/2016  . Pneumococcal Conjugate-13 12/03/2016   Screening Tests Health Maintenance  Topic Date Due  . TETANUS/TDAP  07/[birth year]/1955  . PNA vac Low Risk Adult (1 of 2 - PCV13) 07/21/2001  . INFLUENZA VACCINE  Completed      Plan:   Follow up with PCP as directed.  I have personally reviewed and noted the following in the patient's chart:   . Medical and social history . Use of alcohol, tobacco or illicit drugs  . Current medications and supplements . Functional ability and status . Nutritional status . Physical activity . Advanced directives . List of other physicians . Vitals . Screenings to include cognitive, depression, and falls . Referrals and appointments  In addition, I have reviewed and discussed with patient certain preventive protocols, quality metrics, and best practice recommendations. A written personalized care plan for preventive services as well as general preventive health recommendations were provided to patient.     Williemae Area, RN  12/03/2016

## 2016-12-02 NOTE — Progress Notes (Signed)
PCP notes:   Health maintenance: Tdap: Will check with insurance. PCV13: Received today.  Abnormal screenings: Unable to complete MMSE due to pt being unable to spell or read. Pt did struggle to recall month.   Patient concerns: None.   Nurse concerns: None.   Next PCP appt: 11/2017.

## 2016-12-03 ENCOUNTER — Ambulatory Visit (INDEPENDENT_AMBULATORY_CARE_PROVIDER_SITE_OTHER): Payer: Medicare Other | Admitting: *Deleted

## 2016-12-03 ENCOUNTER — Encounter: Payer: Self-pay | Admitting: *Deleted

## 2016-12-03 VITALS — BP 136/66 | HR 65 | Resp 16 | Ht 70.0 in | Wt 172.4 lb

## 2016-12-03 DIAGNOSIS — Z23 Encounter for immunization: Secondary | ICD-10-CM

## 2016-12-03 DIAGNOSIS — Z0000 Encounter for general adult medical examination without abnormal findings: Secondary | ICD-10-CM

## 2016-12-03 NOTE — Progress Notes (Signed)
I have personally reviewed the Medicare Annual Wellness questionnaire and have noted 1. The patient's medical and social history 2. Their use of alcohol, tobacco or illicit drugs 3. Their current medications and supplements 4. The patient's functional ability including ADL's, fall risks, home safety risks and hearing or visual impairment. 5. Diet and physical activities 6. Evidence for depression or mood disorders 7. Reviewed Updated provider list, see scanned forms and [facility] Snapshot.   The patients weight, height, BMI and visual acuity have been recorded in the chart I have made referrals, counseling and provided education to the patient based review of the above and I have provided the pt with a written personalized care plan for preventive services.  I have provided the patient with a copy of your personalized plan for preventive services. Instructed to take the time to review along with their updated medication list.  Lenix Kidd  

## 2016-12-03 NOTE — Patient Instructions (Addendum)
Mr. [name] [name] , Thank you for taking time to come for your Medicare Wellness Visit. I appreciate your ongoing commitment to your health goals. Please review the following plan we discussed and let me know if I can assist you in the future.   These are the goals we discussed: Goals    . Maintain current health status       This is a list of the screening recommended for you and due dates:  Health Maintenance  Topic Date Due  . Tetanus Vaccine  07/22/1953  . Pneumonia vaccines (1 of 2 - PCV13) 07/21/2001  . Flu Shot  Completed   Preventive Care for Adults  A healthy lifestyle and preventive care can promote health and wellness. Preventive health guidelines for adults include the following key practices.  . A routine yearly physical is a good way to check with your health care provider about your health and preventive screening. It is a chance to share any concerns and updates on your health and to receive a thorough exam.  . Visit your dentist for a routine exam and preventive care every 6 months. Brush your teeth twice a day and floss once a day. Good oral hygiene prevents tooth decay and gum disease.  . The frequency of eye exams is based on your age, health, family medical history, use  of contact lenses, and other factors. Follow your health care provider's ecommendations for frequency of eye exams.  . Eat a healthy diet. Foods like vegetables, fruits, whole grains, low-fat dairy products, and lean protein foods contain the nutrients you need without too many calories. Decrease your intake of foods high in solid fats, added sugars, and salt. Eat the right amount of calories for you. Get information about a proper diet from your health care provider, if necessary.  . Regular physical exercise is one of the most important things you can do for your health. Most adults should get at least 150 minutes of moderate-intensity exercise (any activity that increases your heart rate and causes you  to sweat) each week. In addition, most adults need muscle-strengthening exercises on 2 or more days a week.  Silver Sneakers may be a benefit available to you. To determine eligibility, you may visit the website: www.silversneakers.com or contact program at 310-027-0793 Mon-Fri between 8AM-8PM.   . Maintain a healthy weight. The body mass index (BMI) is a screening tool to identify possible weight problems. It provides an estimate of body fat based on height and weight. Your health care provider can find your BMI and can help you achieve or maintain a healthy weight.   For adults 20 years and older: ? A BMI below 18.5 is considered underweight. ? A BMI of 18.5 to 24.9 is normal. ? A BMI of 25 to 29.9 is considered overweight. ? A BMI of 30 and above is considered obese.   . Maintain normal blood lipids and cholesterol levels by exercising and minimizing your intake of saturated fat. Eat a balanced diet with plenty of fruit and vegetables. Blood tests for lipids and cholesterol should begin at age 3 and be repeated every 5 years. If your lipid or cholesterol levels are high, you are over 50, or you are at high risk for heart disease, you may need your cholesterol levels checked more frequently. Ongoing high lipid and cholesterol levels should be treated with medicines if diet and exercise are not working.  . If you smoke, find out from your health care provider how to quit.  If you do not use tobacco, please do not start.  . If you choose to drink alcohol, please do not consume more than 2 drinks per day. One drink is considered to be 12 ounces (355 mL) of beer, 5 ounces (148 mL) of wine, or 1.5 ounces (44 mL) of liquor.  . If you are 31-3 years old, ask your health care provider if you should take aspirin to prevent strokes.  . Use sunscreen. Apply sunscreen liberally and repeatedly throughout the day. You should seek shade when your shadow is shorter than you. Protect yourself by wearing  long sleeves, pants, a wide-brimmed hat, and sunglasses year round, whenever you are outdoors.  . Once a month, do a whole body skin exam, using a mirror to look at the skin on your back. Tell your health care provider of new moles, moles that have irregular borders, moles that are larger than a pencil eraser, or moles that have changed in shape or color.

## 2016-12-22 NOTE — Progress Notes (Signed)
HPI The patient presents for evaluation of chest pain.  I saw him previously for high blood pressure. He was in the ED in April with chest pain.  I saw him after this. He had a hypertensive urgency but no evidence of ischemia.  Since I last saw him he has done very well.  He says he actually feels better than he has in a long time.  He is splitting wood with a splitter.  He is sleeping better and started taking Xanax for sleep and anxiety.   No Known Allergies  Current Outpatient Medications  Medication Sig Dispense Refill  . ALPRAZolam (XANAX) 0.5 MG tablet Take 1 tablet (0.5 mg total) by mouth at bedtime as needed for anxiety. Take a half a tablet as needed for sleep and anxiety. 90 tablet 0  . amLODipine (NORVASC) 10 MG tablet Take 1 tablet (10 mg total) by mouth daily. 90 tablet 3  . aspirin 81 MG tablet Take 81 mg by mouth daily.    Marland Kitchen lisinopril (PRINIVIL,ZESTRIL) 40 MG tablet Take 1 tablet (40 mg total) by mouth daily. PLEASE MAKE APPOINTMENT FOR FURTHER REFILLS ,1ST ATTEMPT 90 tablet 0  . nitroGLYCERIN (NITROSTAT) 0.4 MG SL tablet Place 1 tablet (0.4 mg total) under the tongue every 5 (five) minutes as needed for chest pain. 90 tablet 3  . pravastatin (PRAVACHOL) 80 MG tablet Take 1 tablet (80 mg total) by mouth daily. PLEASE MAKE APPOINTMENT FOR FURTHER REFILLS , 1ST ATTEMPT 90 tablet 3  . ranitidine (ZANTAC) 75 MG tablet Take 75 mg by mouth as needed for heartburn.     No current facility-administered medications for this visit.     Past Medical History:  Diagnosis Date  . BRADYCARDIA   . CAD   . DYSLIPIDEMIA   . HYPERTENSION   . POSTSURG PERCUT TRANSLUMINAL COR ANGPLSTY STS    LAD 50%, mid RCA 90% treated with BMS 2008  . SLEEP DISORDER   . Squamous cell lung cancer ([place]) 2006    Past Surgical History:  Procedure Laterality Date  . CATARACT EXTRACTION, BILATERAL  2013  . Coronary artery stent  2008  . PNEUMONECTOMY     chemotherapy in 2006    ROS:    As stated  in the HPI and negative for all other systems.  PHYSICAL EXAM BP (!) 142/70   Pulse 60   Ht 5\' 10"  (1.778 m)   Wt 171 lb 12.8 oz (77.9 kg)   SpO2 95%   BMI 24.65 kg/m   GENERAL:  Well appearing NECK:  No jugular venous distention, waveform within normal limits, carotid upstroke brisk and symmetric, no bruits, no thyromegaly LUNGS:  Clear to auscultation bilaterally CHEST:  Unremarkable HEART:  PMI not displaced or sustained,S1 and S2 within normal limits, no S3, no S4, no clicks, no rubs, no murmurs ABD:  Flat, positive bowel sounds normal in frequency in pitch, no bruits, no rebound, no guarding, no midline pulsatile mass, no hepatomegaly, no splenomegaly EXT:  2 plus pulses throughout, no edema, no cyanosis no clubbing  Lab Results  Component Value Date   CHOL 204 (H) 08/25/2016   TRIG 46 08/25/2016   HDL 125 08/25/2016   LDLCALC 70 08/25/2016   LDLDIRECT 78.8 09/10/2011     ASSESSMENT AND PLAN  CHEST PAIN:   The patient has no new sypmtoms.  No further cardiovascular testing is indicated.  We will continue with aggressive risk reduction and meds as listed.  HTN:  The  blood pressure is at target when I reviewed his diary.  NO change in therapy.    CAD:   As above  DYSLIPIDEMIA:  He is at target.  Continue current therapy.   FATIGUE:  This was improved with Xanax.

## 2016-12-25 ENCOUNTER — Encounter: Payer: Self-pay | Admitting: Cardiology

## 2016-12-25 ENCOUNTER — Ambulatory Visit (INDEPENDENT_AMBULATORY_CARE_PROVIDER_SITE_OTHER): Payer: Medicare Other | Admitting: Cardiology

## 2016-12-25 VITALS — BP 142/70 | HR 60 | Ht 70.0 in | Wt 171.8 lb

## 2016-12-25 DIAGNOSIS — E785 Hyperlipidemia, unspecified: Secondary | ICD-10-CM | POA: Diagnosis not present

## 2016-12-25 DIAGNOSIS — I2510 Atherosclerotic heart disease of native coronary artery without angina pectoris: Secondary | ICD-10-CM | POA: Diagnosis not present

## 2016-12-25 DIAGNOSIS — I10 Essential (primary) hypertension: Secondary | ICD-10-CM | POA: Diagnosis not present

## 2016-12-25 NOTE — Patient Instructions (Signed)
Medication Instructions:  Continue current medications  If you need a refill on your cardiac medications before your next appointment, please call your pharmacy.  Labwork: None Ordered   Testing/Procedures: None Ordered  Follow-Up: Your physician wants you to follow-up in: 1 Year. You should receive a reminder letter in the mail two months in advance. If you do not receive a letter, please call our office [phone].    Thank you for choosing [facility]!!      

## 2017-02-02 ENCOUNTER — Ambulatory Visit (INDEPENDENT_AMBULATORY_CARE_PROVIDER_SITE_OTHER): Payer: Medicare Other | Admitting: Family Medicine

## 2017-02-02 ENCOUNTER — Encounter: Payer: Self-pay | Admitting: Family Medicine

## 2017-02-02 VITALS — BP 132/74 | HR 71 | Temp 97.8°F | Ht 70.0 in | Wt 172.4 lb

## 2017-02-02 DIAGNOSIS — R351 Nocturia: Secondary | ICD-10-CM | POA: Diagnosis not present

## 2017-02-02 DIAGNOSIS — R7309 Other abnormal glucose: Secondary | ICD-10-CM

## 2017-02-02 DIAGNOSIS — I2510 Atherosclerotic heart disease of native coronary artery without angina pectoris: Secondary | ICD-10-CM | POA: Diagnosis not present

## 2017-02-02 DIAGNOSIS — Z79899 Other long term (current) drug therapy: Secondary | ICD-10-CM | POA: Diagnosis not present

## 2017-02-02 DIAGNOSIS — F419 Anxiety disorder, unspecified: Secondary | ICD-10-CM

## 2017-02-02 DIAGNOSIS — G479 Sleep disorder, unspecified: Secondary | ICD-10-CM

## 2017-02-02 DIAGNOSIS — Z789 Other specified health status: Secondary | ICD-10-CM | POA: Diagnosis not present

## 2017-02-02 LAB — URINALYSIS, ROUTINE W REFLEX MICROSCOPIC
Bilirubin Urine: NEGATIVE
Hgb urine dipstick: NEGATIVE
Ketones, ur: NEGATIVE
Leukocytes, UA: NEGATIVE
Nitrite: NEGATIVE
Specific Gravity, Urine: 1.01 (ref 1.000–1.030)
Total Protein, Urine: 30 — AB
Urine Glucose: NEGATIVE
Urobilinogen, UA: 0.2 (ref 0.0–1.0)
pH: 6 (ref 5.0–8.0)

## 2017-02-02 LAB — CBC WITH DIFFERENTIAL/PLATELET
Basophils Absolute: 0 10*3/uL (ref 0.0–0.1)
Basophils Relative: 0.7 % (ref 0.0–3.0)
Eosinophils Absolute: 0.1 10*3/uL (ref 0.0–0.7)
Eosinophils Relative: 0.9 % (ref 0.0–5.0)
HCT: 44.2 % (ref 39.0–52.0)
Hemoglobin: 14.8 g/dL (ref 13.0–17.0)
Lymphocytes Relative: 16.5 % (ref 12.0–46.0)
Lymphs Abs: 1.1 10*3/uL (ref 0.7–4.0)
MCHC: 33.6 g/dL (ref 30.0–36.0)
MCV: 97.2 fl (ref 78.0–100.0)
Monocytes Absolute: 0.8 10*3/uL (ref 0.1–1.0)
Monocytes Relative: 12 % (ref 3.0–12.0)
Neutro Abs: 4.7 10*3/uL (ref 1.4–7.7)
Neutrophils Relative %: 69.9 % (ref 43.0–77.0)
Platelets: 298 10*3/uL (ref 150.0–400.0)
RBC: 4.54 Mil/uL (ref 4.22–5.81)
RDW: 13.5 % (ref 11.5–15.5)
WBC: 6.8 10*3/uL (ref 4.0–10.5)

## 2017-02-02 LAB — COMPREHENSIVE METABOLIC PANEL
ALT: 11 U/L (ref 0–53)
AST: 21 U/L (ref 0–37)
Albumin: 4.5 g/dL (ref 3.5–5.2)
Alkaline Phosphatase: 43 U/L (ref 39–117)
BUN: 18 mg/dL (ref 6–23)
CO2: 26 mEq/L (ref 19–32)
Calcium: 9.7 mg/dL (ref 8.4–10.5)
Chloride: 97 mEq/L (ref 96–112)
Creatinine, Ser: 1.33 mg/dL (ref 0.40–1.50)
GFR: 54.91 mL/min — ABNORMAL LOW (ref >60.00)
Glucose, Bld: 97 mg/dL (ref 70–99)
Potassium: 4.8 mEq/L (ref 3.5–5.1)
Sodium: 132 mEq/L — ABNORMAL LOW (ref 135–145)
Total Bilirubin: 0.7 mg/dL (ref 0.2–1.2)
Total Protein: 7.7 g/dL (ref 6.0–8.3)

## 2017-02-02 LAB — MICROALBUMIN / CREATININE URINE RATIO
Creatinine,U: 53.5 mg/dL
Microalb Creat Ratio: 58.2 mg/g — ABNORMAL HIGH (ref 0.0–30.0)
Microalb, Ur: 31.1 mg/dL — ABNORMAL HIGH (ref 0.0–1.9)

## 2017-02-02 LAB — PSA: PSA: 0.67 ng/mL (ref 0.10–4.00)

## 2017-02-02 MED ORDER — ALPRAZOLAM 0.5 MG PO TABS: 0.5000 mg | ORAL_TABLET | Freq: Every evening | ORAL | 0 refills | Status: DC | PRN

## 2017-02-02 MED ORDER — TRAZODONE HCL 50 MG PO TABS: 25.0000 mg | ORAL_TABLET | Freq: Every evening | ORAL | 3 refills | Status: DC | PRN

## 2017-02-02 NOTE — Progress Notes (Signed)
[name] [name] is a 82 y.o. male is here for follow up.  History of Present Illness:   HPI: See Assessment and Plan section for Problem Based Charting of issues discussed today.  There are no preventive care reminders to display for this patient.   Depression screen [name] [name] 2/9 12/03/2016 10/07/2016  Decreased Interest 0 0  Down, Depressed, Hopeless 0 0  PHQ - 2 Score 0 0   PMHx, SurgHx, SocialHx, FamHx, Medications, and Allergies were reviewed in the Visit Navigator and updated as appropriate.   Patient Active Problem List   Diagnosis Date Noted  . CKD (chronic kidney disease) stage 3, GFR 30-59 ml/min (HCC) 10/10/2016  . Alcohol consumption of more than two drinks per day 10/10/2016  . Anxiety 10/10/2016  . Elevated glucose 08/08/2011  . Disturbance in sleep behavior 02/08/2009  . Cancer of left lung parenchyma ([place]) 04/23/2008  . HLD (hyperlipidemia) 04/23/2008  . Essential hypertension 03/18/2006  . POSTSURG PERCUT TRANSLUMINAL COR ANGPLSTY STS 03/17/2006  . Coronary atherosclerosis 03/13/2006   Social History   Tobacco Use  . Smoking status: Former Smoker    Packs/day: 1.50    Years: 20.00    Pack years: 30.00    Types: Cigarettes    Last attempt to quit: 02/18/1996    Years since quitting: 20.9  . Smokeless tobacco: Never Used  Substance Use Topics  . Alcohol use: Yes    Alcohol/week: 8.4 oz    Types: 14 Cans of beer per week    Comment: more when on vacation  . Drug use: No   Current Medications and Allergies:   .  ALPRAZolam (XANAX) 0.5 MG tablet, Take 1 tablet (0.5 mg total) by mouth at bedtime as needed for anxiety. Take a half a tablet as needed for sleep and anxiety., Disp: 30 tablet, Rfl: 0 .  amLODipine (NORVASC) 10 MG tablet, Take 1 tablet (10 mg total) by mouth daily., Disp: 90 tablet, Rfl: 3 .  aspirin 81 MG tablet, Take 81 mg by mouth daily., Disp: , Rfl:  .  lisinopril (PRINIVIL,ZESTRIL) 40 MG tablet, Take 1 tablet (40 mg total) by mouth daily. PLEASE  MAKE APPOINTMENT FOR FURTHER REFILLS ,1ST ATTEMPT, Disp: 90 tablet, Rfl: 0 .  nitroGLYCERIN (NITROSTAT) 0.4 MG SL tablet, Place 1 tablet (0.4 mg total) under the tongue every 5 (five) minutes as needed for chest pain., Disp: 90 tablet, Rfl: 3 .  pravastatin (PRAVACHOL) 80 MG tablet, Take 1 tablet (80 mg total) by mouth daily. PLEASE MAKE APPOINTMENT FOR FURTHER REFILLS , 1ST ATTEMPT, Disp: 90 tablet, Rfl: 3 .  ranitidine (ZANTAC) 75 MG tablet, Take 75 mg by mouth as needed for heartburn., Disp: , Rfl:  .  traZODone (DESYREL) 50 MG tablet, Take 0.5-1 tablets (25-50 mg total) by mouth at bedtime as needed for sleep., Disp: 30 tablet, Rfl: 3  No Known Allergies   Review of Systems   Pertinent items are noted in the HPI. Otherwise, ROS is negative.  Vitals:   Vitals:   02/02/17 0906  BP: 132/74  Pulse: 71  Temp: 97.8 F (36.6 C)  TempSrc: Oral  SpO2: 96%  Weight: 172 lb 6.4 oz (78.2 kg)  Height: 5\' 10"  (1.778 m)     Body mass index is 24.74 kg/m.   Physical Exam:   Physical Exam  Constitutional: He is oriented to person, place, and time. He appears well-developed and well-nourished. No distress.  HENT:  Head: Normocephalic and atraumatic.  Right Ear: External ear  normal.  Left Ear: External ear normal.  Nose: Nose normal.  Mouth/Throat: Oropharynx is clear and moist.  Eyes: Conjunctivae and EOM are normal. Pupils are equal, round, and reactive to light.  Neck: Normal range of motion. Neck supple.  Cardiovascular: Normal rate, regular rhythm, normal heart sounds and intact distal pulses.  Pulmonary/Chest: Effort normal and breath sounds normal.  Abdominal: Soft. Bowel sounds are normal.  Musculoskeletal: Normal range of motion.  Neurological: He is alert and oriented to person, place, and time.  Skin: Skin is warm and dry.  Psychiatric: He has a normal mood and affect. His behavior is normal. Judgment and thought content normal.  Nursing note and vitals reviewed.     Assessment and Plan:   [name] [name] was seen today for follow-up.  Diagnoses and all orders for this visit:  Disturbance in sleep behavior Comments: Chronic but worsening problem.  Patient has a history of insomnia.  At her last visit, he was finding some relief with the Xanax below.  He was taking a full 0.5 mg tab at night.  He had also decreased his alcohol consumption.  Unfortunately, the patient continues to drink alcohol on some nights when he is with his friend.  He will have up to 5 beverages.  He usually takes the Xanax 1-2 hours prior to going to bed.  He states that there are several nights that he will stay up until midnight or later and take another pill.  He still does not go to sleep for a few hours.  He admits that part of his insomnia has to do with a number of times he is urinating throughout the night.  See below for nocturia.  We discussed the importance of sleep.  I recommend that he stop alcohol altogether.  His daughter is with him and admits that when she is around, he does not drink any alcohol.  He admits that his sleep is better on those days.  We discussed other modalities of sleep training.  We discussed good sleep hygiene.  Okay trial of trazodone as below.  Orders: -     traZODone (DESYREL) 50 MG tablet; Take 0.5-1 tablets (25-50 mg total) by mouth at bedtime as needed for sleep.  Nocturia Comments: Patient wakes almost hourly on some nights to urinate.  This is likely related to his alcohol consumption.  However, we will check a PSA and consider starting Flomax.  We discussed the risk and benefits of checking a PSA in his age group. Orders: -     Comprehensive metabolic panel -     CBC with Differential/Platelet -     Urinalysis, Routine w reflex microscopic -     PSA  Alcohol consumption of more than two drinks per day Comments: As above.  On some days the patient has no alcohol.  On others he will drink up to 5 beverages.  We had a long discussion regarding the  risks of continued alcohol use.  He endorses wanting to stop and working on that.  Anxiety Comments: Patient is here for evaluation of anxiety.  He has the following anxiety symptoms: insomnia, racing thoughts. Risk factors: ETOH use Previous treatment includes medication Xanax.   He complains of the following medication side effects: none. Orders: -     ALPRAZolam (XANAX) 0.5 MG tablet; Take 1 tablet (0.5 mg total) by mouth at bedtime as needed for anxiety. Take a half a tablet as needed for sleep and anxiety.  Elevated glucose Comments:  Lab Results  Component Value Date   HGBA1C 5.2 11/03/2016   Orders: -     Microalbumin / creatinine urine ratio  Medication management -     Pain Mgmt, Profile 8 w/Conf, U   . Reviewed expectations re: course of current medical issues. . Discussed self-management of symptoms. . Outlined signs and symptoms indicating need for more acute intervention. . Patient verbalized understanding and all questions were answered. Marland Kitchen Health Maintenance issues including appropriate healthy diet, exercise, and smoking avoidance were discussed with patient. . See orders for this visit as documented in the electronic medical record. . Patient received an After Visit Summary.  Briscoe Deutscher, DO , [facility] 02/02/2017  Future Appointments  Date Time Provider [place]  05/05/2017  9:00 AM Briscoe Deutscher, DO [facility]  10/08/2017  8:30 AM [place] Phlegm, NP [facility] None  12/04/2017  8:00 AM Williemae Area, RN [facility]  12/04/2017  9:00 AM [facility]  12/11/2017  9:00 AM Briscoe Deutscher, DO [facility]

## 2017-02-03 ENCOUNTER — Other Ambulatory Visit: Payer: Self-pay

## 2017-02-03 DIAGNOSIS — G479 Sleep disorder, unspecified: Secondary | ICD-10-CM

## 2017-02-03 MED ORDER — TRAZODONE HCL 50 MG PO TABS: 25.0000 mg | ORAL_TABLET | Freq: Every evening | ORAL | 3 refills | Status: DC | PRN

## 2017-02-05 LAB — PAIN MGMT, PROFILE 8 W/CONF, U
6 Acetylmorphine: NEGATIVE ng/mL (ref <10)
Alcohol Metabolites: POSITIVE ng/mL — AB (ref <500)
Alphahydroxyalprazolam: 33 ng/mL — ABNORMAL HIGH (ref <25)
Alphahydroxymidazolam: NEGATIVE ng/mL (ref <50)
Alphahydroxytriazolam: NEGATIVE ng/mL (ref <50)
Aminoclonazepam: NEGATIVE ng/mL (ref <25)
Amphetamines: NEGATIVE ng/mL (ref <500)
Benzodiazepines: POSITIVE ng/mL — AB (ref <100)
Buprenorphine, Urine: NEGATIVE ng/mL (ref <5)
Cocaine Metabolite: NEGATIVE ng/mL (ref <150)
Creatinine: 49 mg/dL
Ethyl Glucuronide (ETG): 25960 ng/mL — ABNORMAL HIGH (ref <500)
Ethyl Sulfate (ETS): 6749 ng/mL — ABNORMAL HIGH (ref <100)
Hydroxyethylflurazepam: NEGATIVE ng/mL (ref <50)
Lorazepam: NEGATIVE ng/mL (ref <50)
MDMA: NEGATIVE ng/mL (ref <500)
Marijuana Metabolite: NEGATIVE ng/mL (ref <20)
Nordiazepam: NEGATIVE ng/mL (ref <50)
Opiates: NEGATIVE ng/mL (ref <100)
Oxazepam: NEGATIVE ng/mL (ref <50)
Oxidant: NEGATIVE ug/mL (ref <200)
Oxycodone: NEGATIVE ng/mL (ref <100)
Temazepam: NEGATIVE ng/mL (ref <50)
pH: 5.76 (ref 4.5–9.0)

## 2017-03-05 ENCOUNTER — Other Ambulatory Visit: Payer: Self-pay | Admitting: Family Medicine

## 2017-03-05 DIAGNOSIS — F419 Anxiety disorder, unspecified: Secondary | ICD-10-CM

## 2017-03-05 NOTE — Telephone Encounter (Signed)
MEDICATION:   PHARMACY:    IS THIS A 90 DAY SUPPLY :   IS PATIENT OUT OF MEDICATION:   IF NOT; HOW MUCH IS LEFT:   LAST APPOINTMENT DATE: @12 /17/2018  NEXT APPOINTMENT DATE:@3 /19/2019  OTHER COMMENTS:    **Let patient know to contact pharmacy at the end of the day to make sure medication is ready. **  ** Please notify patient to allow 48-72 hours to process**  **Encourage patient to contact the pharmacy for refills or they can request refills through [facility]**

## 2017-03-13 ENCOUNTER — Other Ambulatory Visit: Payer: Self-pay

## 2017-03-13 DIAGNOSIS — G479 Sleep disorder, unspecified: Secondary | ICD-10-CM

## 2017-03-13 MED ORDER — TRAZODONE HCL 50 MG PO TABS: 25.0000 mg | ORAL_TABLET | Freq: Every evening | ORAL | 0 refills | Status: DC | PRN

## 2017-04-09 ENCOUNTER — Other Ambulatory Visit: Payer: Self-pay | Admitting: Family Medicine

## 2017-04-09 DIAGNOSIS — F419 Anxiety disorder, unspecified: Secondary | ICD-10-CM

## 2017-04-09 NOTE — Telephone Encounter (Signed)
Okay refill. Make sure that he has upcoming visit.

## 2017-04-09 NOTE — Telephone Encounter (Signed)
Ok to refill 

## 2017-05-05 ENCOUNTER — Encounter: Payer: Self-pay | Admitting: Family Medicine

## 2017-05-05 ENCOUNTER — Ambulatory Visit (INDEPENDENT_AMBULATORY_CARE_PROVIDER_SITE_OTHER): Payer: Medicare Other | Admitting: Family Medicine

## 2017-05-05 VITALS — BP 140/62 | HR 64 | Temp 97.1°F | Wt 172.8 lb

## 2017-05-05 DIAGNOSIS — I2510 Atherosclerotic heart disease of native coronary artery without angina pectoris: Secondary | ICD-10-CM | POA: Diagnosis not present

## 2017-05-05 DIAGNOSIS — J439 Emphysema, unspecified: Secondary | ICD-10-CM

## 2017-05-05 DIAGNOSIS — Z23 Encounter for immunization: Secondary | ICD-10-CM

## 2017-05-05 DIAGNOSIS — N183 Chronic kidney disease, stage 3 unspecified: Secondary | ICD-10-CM

## 2017-05-05 DIAGNOSIS — E871 Hypo-osmolality and hyponatremia: Secondary | ICD-10-CM

## 2017-05-05 DIAGNOSIS — F419 Anxiety disorder, unspecified: Secondary | ICD-10-CM | POA: Diagnosis not present

## 2017-05-05 DIAGNOSIS — R600 Localized edema: Secondary | ICD-10-CM

## 2017-05-05 DIAGNOSIS — I10 Essential (primary) hypertension: Secondary | ICD-10-CM

## 2017-05-05 DIAGNOSIS — L989 Disorder of the skin and subcutaneous tissue, unspecified: Secondary | ICD-10-CM | POA: Diagnosis not present

## 2017-05-05 DIAGNOSIS — I700 Atherosclerosis of aorta: Secondary | ICD-10-CM | POA: Diagnosis not present

## 2017-05-05 DIAGNOSIS — Z789 Other specified health status: Secondary | ICD-10-CM | POA: Diagnosis not present

## 2017-05-05 HISTORY — DX: Emphysema, unspecified: J43.9

## 2017-05-05 HISTORY — DX: Atherosclerosis of aorta: I70.0

## 2017-05-05 HISTORY — DX: Hypo-osmolality and hyponatremia: E87.1

## 2017-05-05 MED ORDER — ALPRAZOLAM 0.5 MG PO TABS: ORAL_TABLET | ORAL | 0 refills | Status: DC

## 2017-05-05 MED ORDER — FUROSEMIDE 20 MG PO TABS: 20.0000 mg | ORAL_TABLET | Freq: Every day | ORAL | 0 refills | Status: DC

## 2017-05-05 MED ORDER — POTASSIUM CHLORIDE CRYS ER 20 MEQ PO TBCR: 20.0000 meq | EXTENDED_RELEASE_TABLET | Freq: Every day | ORAL | 0 refills | Status: DC

## 2017-05-05 MED ORDER — TETANUS-DIPHTH-ACELL PERTUSSIS 5-2.5-18.5 LF-MCG/0.5 IM SUSP: 0.5000 mL | Freq: Once | INTRAMUSCULAR | 0 refills | Status: AC

## 2017-05-05 NOTE — Progress Notes (Signed)
[name] [name] is a 83 y.o. male is here for follow up.  History of Present Illness:   [name] [name], CMA acting as scribe for Dr. Briscoe Deutscher.   HPI: Patient in for follow up. Has noticed a small spot on the left side of head about two months ago. No pain but does itch from time to time.   There are no preventive care reminders to display for this patient.   Depression screen [facility] 2/9 12/03/2016 10/07/2016  Decreased Interest 0 0  Down, Depressed, Hopeless 0 0  PHQ - 2 Score 0 0   PMHx, SurgHx, SocialHx, FamHx, Medications, and Allergies were reviewed in the Visit Navigator and updated as appropriate.   Patient Active Problem List   Diagnosis Date Noted  . Chronic hyponatremia 05/05/2017  . Aortic atherosclerosis ([place]) 05/05/2017  . COPD with emphysema ([place]) 05/05/2017  . CKD (chronic kidney disease) stage 3, GFR 30-59 ml/min (HCC) 10/10/2016  . Alcohol consumption of more than two drinks per day 10/10/2016  . Anxiety 10/10/2016  . Disturbance in sleep behavior 02/08/2009  . Cancer of left lung parenchyma ([place]) 04/23/2008  . HLD (hyperlipidemia) 04/23/2008  . Essential hypertension 03/18/2006  . Coronary atherosclerosis 03/13/2006   Social History   Tobacco Use  . Smoking status: Former Smoker    Packs/day: 1.50    Years: 20.00    Pack years: 30.00    Types: Cigarettes    Last attempt to quit: 02/18/1996    Years since quitting: 21.2  . Smokeless tobacco: Never Used  Substance Use Topics  . Alcohol use: Yes    Alcohol/week: 8.4 oz    Types: 14 Cans of beer per week    Comment: more when on vacation  . Drug use: No   Current Medications and Allergies:   .  ALPRAZolam (XANAX) 0.5 MG tablet, TAKE HALF TO 1 TABLET BY MOUTH AT BEDTIME AS NEEDED FOR ANXIETY., Disp: 90 tablet, Rfl: 0 .  amLODipine (NORVASC) 10 MG tablet, Take 1 tablet (10 mg total) by mouth daily., Disp: 90 tablet, Rfl: 3 .  aspirin 81 MG tablet, Take 81 mg by mouth daily., Disp: , Rfl:  .   lisinopril (PRINIVIL,ZESTRIL) 40 MG tablet, Take 1 tablet (40 mg total) by mouth daily. PLEASE MAKE APPOINTMENT FOR FURTHER REFILLS ,1ST ATTEMPT, Disp: 90 tablet, Rfl: 0 .  nitroGLYCERIN (NITROSTAT) 0.4 MG SL tablet, Place 1 tablet (0.4 mg total) under the tongue every 5 (five) minutes as needed for chest pain., Disp: 90 tablet, Rfl: 3 .  pravastatin (PRAVACHOL) 80 MG tablet, Take 1 tablet (80 mg total) by mouth daily. PLEASE MAKE APPOINTMENT FOR FURTHER REFILLS , 1ST ATTEMPT, Disp: 90 tablet, Rfl: 3 .  ranitidine (ZANTAC) 75 MG tablet, Take 75 mg by mouth as needed for heartburn., Disp: , Rfl:  .  traZODone (DESYREL) 50 MG tablet, Take 0.5-1 tablets (25-50 mg total) by mouth at bedtime as needed for sleep., Disp: 90 tablet, Rfl: 0 .  furosemide (LASIX) 20 MG tablet, Take 1 tablet (20 mg total) by mouth daily., Disp: 20 tablet, Rfl: 0 .  potassium chloride SA (K-DUR,KLOR-CON) 20 MEQ tablet, Take 1 tablet (20 mEq total) by mouth daily., Disp: 20 tablet, Rfl: 0 .  Tdap (BOOSTRIX) 5-2.5-18.5 LF-MCG/0.5 injection, Inject 0.5 mLs into the muscle once for 1 dose., Disp: 0.5 mL, Rfl: 0  No Known Allergies   Review of Systems   Pertinent items are noted in the HPI. Otherwise, ROS is negative.  Vitals:   Vitals:   05/05/17 0856  BP: (!) 144/68  Pulse: 64  Temp: (!) 97.1 F (36.2 C)  TempSrc: Oral  SpO2: 93%  Weight: 172 lb 12.8 oz (78.4 kg)     Body mass index is 24.79 kg/m.  Physical Exam:   Physical Exam  Constitutional: He is oriented to person, place, and time. He appears well-developed and well-nourished. No distress.  HENT:  Head: Normocephalic and atraumatic.  Right Ear: External ear normal.  Left Ear: External ear normal.  Nose: Nose normal.  Mouth/Throat: Oropharynx is clear and moist.  Eyes: Conjunctivae and EOM are normal. Pupils are equal, round, and reactive to light.  Neck: Normal range of motion. Neck supple.  Cardiovascular: Normal rate, regular rhythm, normal heart  sounds and intact distal pulses.  Pulmonary/Chest: Effort normal and breath sounds normal.  Abdominal: Soft. Bowel sounds are normal.  Musculoskeletal: Normal range of motion.  Neurological: He is alert and oriented to person, place, and time.  Skin: Skin is warm and dry.  SK vs AK bilateral temples, forearms.  Psychiatric: He has a normal mood and affect. His behavior is normal. Judgment and thought content normal.  Nursing note and vitals reviewed.   Assessment and Plan:   1. Atherosclerosis of native coronary artery of native heart without angina pectoris  2. CKD (chronic kidney disease) stage 3, GFR 30-59 ml/min (HCC)  Lab Results  Component Value Date   CREATININE 1.33 02/02/2017   CREATININE 1.27 11/03/2016   CREATININE 1.40 10/07/2016   3. Alcohol consumption of more than two drinks per day Improving with improved sleep.  4. Aortic atherosclerosis ([place])  5. Essential hypertension Review: taking medications as instructed, no medication side effects noted, no TIAs, no chest pain on exertion, no dyspnea on exertion, no swelling of ankles.  Smoker: No.  Wt Readings from Last 3 Encounters:  05/05/17 172 lb 12.8 oz (78.4 kg)  02/02/17 172 lb 6.4 oz (78.2 kg)  12/25/16 171 lb 12.8 oz (77.9 kg)   BP Readings from Last 3 Encounters:  05/05/17 140/62  02/02/17 132/74  12/25/16 (!) 142/70   Lab Results  Component Value Date   CREATININE 1.33 02/02/2017   6. Need for Tdap vaccination - Tdap (Galva) 5-2.5-18.5 LF-MCG/0.5 injection; Inject 0.5 mLs into the muscle once for 1 dose.  Dispense: 0.5 mL; Refill: 0  7. Anxiety Patient is here for follow up of anxiety and insomnia.  He has the following anxiety symptoms: insomnia. Symptoms have been gradually improving since that time. He denies current suicidal and homicidal ideation. Well controlled.  No signs of complications, medication side effects, or red flags.  Continue current regimen.    - ALPRAZolam (XANAX) 0.5 MG  tablet; TAKE HALF TO 1 TABLET BY MOUTH AT BEDTIME AS NEEDED FOR ANXIETY.  Dispense: 90 tablet; Refill: 0  8. Edema Mild. Okay Lasix/K combo prn. Instructions provided.   9. Skin lesions, cryotherapy  Reason: suspicious lesions Location: bilateral temples, forearms - total 6  Liquid nitrogen was applied using the liquid nitrogen gun without difficulty with an otoscope tip for concentration. Tolerated well without complications.  . Reviewed expectations re: course of current medical issues. . Discussed self-management of symptoms. . Outlined signs and symptoms indicating need for more acute intervention. . Patient verbalized understanding and all questions were answered. Marland Kitchen Health Maintenance issues including appropriate healthy diet, exercise, and smoking avoidance were discussed with patient. . See orders for this visit as documented in the electronic medical record. Marland Kitchen  Patient received an After Visit Summary.  CMA served as Education administrator during this visit. History, Physical, and Plan performed by medical provider. The above documentation has been reviewed and is accurate and complete. Briscoe Deutscher, D.O.  Briscoe Deutscher, DO [place], [facility] 05/05/2017

## 2017-05-07 ENCOUNTER — Other Ambulatory Visit: Payer: Self-pay | Admitting: Cardiology

## 2017-05-08 NOTE — Telephone Encounter (Signed)
Rx has been sent to the pharmacy electronically. ° °

## 2017-05-14 ENCOUNTER — Telehealth: Payer: Self-pay | Admitting: Family Medicine

## 2017-05-14 NOTE — Telephone Encounter (Signed)
Copied from [place] #[id number]. Topic: General - Other >> May 14, 2017  1:31 PM Carolyn Stare wrote:  Pt daughter [name] [name] call to ask why pt is taking the below med. She said she was not aware that pt was put back on this med as he use to take it   Teresa [phone]   potassium chloride SA (K-DUR,KLOR-CON) 20 MEQ tablet

## 2017-05-15 NOTE — Telephone Encounter (Signed)
Notified patient that the potasium goes with the Lasix. So any time he takes the Lasix he would take the potassium.

## 2017-05-18 ENCOUNTER — Other Ambulatory Visit: Payer: Self-pay | Admitting: Cardiology

## 2017-05-18 ENCOUNTER — Telehealth: Payer: Self-pay | Admitting: Cardiology

## 2017-05-18 MED ORDER — LISINOPRIL 40 MG PO TABS: 40.0000 mg | ORAL_TABLET | Freq: Every day | ORAL | 2 refills | Status: DC

## 2017-05-18 MED ORDER — LISINOPRIL 40 MG PO TABS: 40.0000 mg | ORAL_TABLET | Freq: Every day | ORAL | 0 refills | Status: DC

## 2017-05-18 NOTE — Telephone Encounter (Signed)
Spoke with pharmacy tech @ [facility] and provided verbal order for lisinopril 40mg  refill

## 2017-05-18 NOTE — Telephone Encounter (Signed)
Rx(s) sent to pharmacy electronically to local & mail order pharmacy per request.

## 2017-05-18 NOTE — Telephone Encounter (Signed)
Pt c/o medication issue:  1. Name of Medication:   lisinopril (PRINIVIL,ZESTRIL) 40 MG tablet    2. How are you currently taking this medication (dosage and times per day)? Take 1 tablet (40 mg total) by mouth daily. 3. Are you having a reaction (difficulty breathing--STAT)? no  4. What is your medication issue? Sonja want to know if the medication is suppose to be 1 year or a 2 year refill

## 2017-05-18 NOTE — Telephone Encounter (Signed)
°*  STAT* If patient is at the pharmacy, call can be transferred to refill team.   1. Which medications need to be refilled? (please list name of each medication and dose if known) Lisinopril 40 mg   2. Which pharmacy/location (including street and city if local pharmacy) is medication to be sent to?[facility]  3. Do they need a 30 day or 90 day supply?[place]

## 2017-05-18 NOTE — Telephone Encounter (Signed)
°*  STAT* If patient is at the pharmacy, call can be transferred to refill team.   1. Which medications need to be refilled? (please list name of each medication and dose if known) lisinopril (PRINIVIL,ZESTRIL) 40 MG tablet    Take 1 tablet (40 mg total) by mouth daily.  2. Which pharmacy/location (including street and city if local pharmacy) is medication to be sent to?  3. Do they need a 30 day or 90 day supply? [place]

## 2017-05-19 MED ORDER — LISINOPRIL 40 MG PO TABS: 40.0000 mg | ORAL_TABLET | Freq: Every day | ORAL | 2 refills | Status: DC

## 2017-06-09 ENCOUNTER — Other Ambulatory Visit: Payer: Self-pay | Admitting: Family Medicine

## 2017-06-09 DIAGNOSIS — G479 Sleep disorder, unspecified: Secondary | ICD-10-CM

## 2017-06-09 NOTE — Telephone Encounter (Signed)
Ok to fill 

## 2017-06-15 ENCOUNTER — Other Ambulatory Visit: Payer: Self-pay | Admitting: Cardiology

## 2017-06-15 NOTE — Telephone Encounter (Signed)
REFILL 

## 2017-08-05 ENCOUNTER — Ambulatory Visit (INDEPENDENT_AMBULATORY_CARE_PROVIDER_SITE_OTHER): Payer: Medicare Other | Admitting: Family Medicine

## 2017-08-05 ENCOUNTER — Encounter: Payer: Self-pay | Admitting: Family Medicine

## 2017-08-05 ENCOUNTER — Ambulatory Visit (INDEPENDENT_AMBULATORY_CARE_PROVIDER_SITE_OTHER): Payer: Medicare Other

## 2017-08-05 VITALS — BP 158/66 | HR 63 | Temp 97.7°F | Ht 70.0 in | Wt 166.6 lb

## 2017-08-05 DIAGNOSIS — F419 Anxiety disorder, unspecified: Secondary | ICD-10-CM | POA: Diagnosis not present

## 2017-08-05 DIAGNOSIS — N183 Chronic kidney disease, stage 3 unspecified: Secondary | ICD-10-CM

## 2017-08-05 DIAGNOSIS — E781 Pure hyperglyceridemia: Secondary | ICD-10-CM

## 2017-08-05 DIAGNOSIS — J439 Emphysema, unspecified: Secondary | ICD-10-CM

## 2017-08-05 DIAGNOSIS — Z789 Other specified health status: Secondary | ICD-10-CM

## 2017-08-05 DIAGNOSIS — Z13220 Encounter for screening for lipoid disorders: Secondary | ICD-10-CM

## 2017-08-05 DIAGNOSIS — I2510 Atherosclerotic heart disease of native coronary artery without angina pectoris: Secondary | ICD-10-CM | POA: Diagnosis not present

## 2017-08-05 DIAGNOSIS — M25561 Pain in right knee: Secondary | ICD-10-CM | POA: Diagnosis not present

## 2017-08-05 DIAGNOSIS — C3492 Malignant neoplasm of unspecified part of left bronchus or lung: Secondary | ICD-10-CM

## 2017-08-05 DIAGNOSIS — I10 Essential (primary) hypertension: Secondary | ICD-10-CM | POA: Diagnosis not present

## 2017-08-05 DIAGNOSIS — M1711 Unilateral primary osteoarthritis, right knee: Secondary | ICD-10-CM | POA: Diagnosis not present

## 2017-08-05 LAB — LIPID PANEL
Cholesterol: 207 mg/dL — ABNORMAL HIGH (ref 0–200)
HDL: 110.5 mg/dL (ref >39.00)
LDL Cholesterol: 86 mg/dL (ref 0–99)
NonHDL: 96.07
Total CHOL/HDL Ratio: 2
Triglycerides: 48 mg/dL (ref 0.0–149.0)
VLDL: 9.6 mg/dL (ref 0.0–40.0)

## 2017-08-05 LAB — CBC WITH DIFFERENTIAL/PLATELET
Basophils Absolute: 0 10*3/uL (ref 0.0–0.1)
Basophils Relative: 0.8 % (ref 0.0–3.0)
Eosinophils Absolute: 0.1 10*3/uL (ref 0.0–0.7)
Eosinophils Relative: 1 % (ref 0.0–5.0)
HCT: 43.1 % (ref 39.0–52.0)
Hemoglobin: 14.8 g/dL (ref 13.0–17.0)
Lymphocytes Relative: 18.7 % (ref 12.0–46.0)
Lymphs Abs: 1.1 10*3/uL (ref 0.7–4.0)
MCHC: 34.3 g/dL (ref 30.0–36.0)
MCV: 94.4 fl (ref 78.0–100.0)
Monocytes Absolute: 0.9 10*3/uL (ref 0.1–1.0)
Monocytes Relative: 15.4 % — ABNORMAL HIGH (ref 3.0–12.0)
Neutro Abs: 3.7 10*3/uL (ref 1.4–7.7)
Neutrophils Relative %: 64.1 % (ref 43.0–77.0)
Platelets: 247 10*3/uL (ref 150.0–400.0)
RBC: 4.57 Mil/uL (ref 4.22–5.81)
RDW: 14.3 % (ref 11.5–15.5)
WBC: 5.8 10*3/uL (ref 4.0–10.5)

## 2017-08-05 LAB — COMPREHENSIVE METABOLIC PANEL
ALT: 12 U/L (ref 0–53)
AST: 23 U/L (ref 0–37)
Albumin: 4.7 g/dL (ref 3.5–5.2)
Alkaline Phosphatase: 47 U/L (ref 39–117)
BUN: 18 mg/dL (ref 6–23)
CO2: 25 mEq/L (ref 19–32)
Calcium: 10.1 mg/dL (ref 8.4–10.5)
Chloride: 97 mEq/L (ref 96–112)
Creatinine, Ser: 1.25 mg/dL (ref 0.40–1.50)
GFR: 58.91 mL/min — ABNORMAL LOW (ref >60.00)
Glucose, Bld: 91 mg/dL (ref 70–99)
Potassium: 4.5 mEq/L (ref 3.5–5.1)
Sodium: 131 mEq/L — ABNORMAL LOW (ref 135–145)
Total Bilirubin: 0.7 mg/dL (ref 0.2–1.2)
Total Protein: 8.2 g/dL (ref 6.0–8.3)

## 2017-08-05 LAB — MAGNESIUM: Magnesium: 2 mg/dL (ref 1.5–2.5)

## 2017-08-05 LAB — URIC ACID: Uric Acid, Serum: 5.3 mg/dL (ref 4.0–7.8)

## 2017-08-05 LAB — PHOSPHORUS: Phosphorus: 3.2 mg/dL (ref 2.3–4.6)

## 2017-08-05 MED ORDER — PRAVASTATIN SODIUM 80 MG PO TABS: 80.0000 mg | ORAL_TABLET | Freq: Every day | ORAL | 3 refills | Status: DC

## 2017-08-05 MED ORDER — METHYLPREDNISOLONE ACETATE 80 MG/ML IJ SUSP
80.0000 mg | Freq: Once | INTRAMUSCULAR | Status: AC
  Administered 2017-08-05: 80 mg via INTRAMUSCULAR

## 2017-08-05 MED ORDER — AMLODIPINE BESYLATE 10 MG PO TABS: 10.0000 mg | ORAL_TABLET | Freq: Every day | ORAL | 3 refills | Status: DC

## 2017-08-05 MED ORDER — ALPRAZOLAM 0.5 MG PO TABS: ORAL_TABLET | ORAL | 0 refills | Status: DC

## 2017-08-05 MED ORDER — PREDNISONE 5 MG PO TABS: 5.0000 mg | ORAL_TABLET | Freq: Every day | ORAL | 0 refills | Status: DC

## 2017-08-05 MED ORDER — LISINOPRIL 40 MG PO TABS: 40.0000 mg | ORAL_TABLET | Freq: Every day | ORAL | 3 refills | Status: DC

## 2017-08-05 NOTE — Progress Notes (Signed)
[name] [name] is a 83 y.o. male is here for follow up.  History of Present Illness:   HPI:   1. Acute pain of right knee. NEW. No injury. Swollen, warm. No Hx of the same. Using muscle rub with little relief.    2. Alcohol consumption of more than two drinks per day. Still drinking but ready to quit if it is causing knee pain.   3. CKD (chronic kidney disease) stage 3, GFR 30-59 ml/min ([place]). Due for labs.   4. Anxiety/Insomnia. Doing well with current treatment.    5. Essential hypertension. Elevated today but compliant with medications. No CP, SOB, edema.    6. Pure hyperglyceridemia. Tolerating statin without myalgias.     There are no preventive care reminders to display for this patient.   Depression screen [facility] 2/9 12/03/2016 10/07/2016  Decreased Interest 0 0  Down, Depressed, Hopeless 0 0  PHQ - 2 Score 0 0   PMHx, SurgHx, SocialHx, FamHx, Medications, and Allergies were reviewed in the Visit Navigator and updated as appropriate.   Patient Active Problem List   Diagnosis Date Noted  . Chronic hyponatremia 05/05/2017  . Aortic atherosclerosis ([place]) 05/05/2017  . COPD with emphysema ([place]) 05/05/2017  . CKD (chronic kidney disease) stage 3, GFR 30-59 ml/min (HCC) 10/10/2016  . Alcohol consumption of more than two drinks per day 10/10/2016  . Anxiety 10/10/2016  . Disturbance in sleep behavior 02/08/2009  . Cancer of left lung parenchyma ([place]) 04/23/2008  . HLD (hyperlipidemia) 04/23/2008  . Essential hypertension 03/18/2006  . Coronary atherosclerosis 03/13/2006   Social History   Tobacco Use  . Smoking status: Former Smoker    Packs/day: 1.50    Years: 20.00    Pack years: 30.00    Types: Cigarettes    Last attempt to quit: 02/18/1996    Years since quitting: 21.4  . Smokeless tobacco: Never Used  Substance Use Topics  . Alcohol use: Yes    Alcohol/week: 8.4 oz    Types: 14 Cans of beer per week    Comment: more when on vacation  . Drug use: No    Current Medications and Allergies:   Current Outpatient Medications:  .  ALPRAZolam (XANAX) 0.5 MG tablet, TAKE HALF TO 1 TABLET BY MOUTH AT BEDTIME AS NEEDED FOR ANXIETY., Disp: 90 tablet, Rfl: 0 .  amLODipine (NORVASC) 10 MG tablet, Take 1 tablet (10 mg total) by mouth daily., Disp: 90 tablet, Rfl: 3 .  aspirin 81 MG tablet, Take 81 mg by mouth daily., Disp: , Rfl:  .  furosemide (LASIX) 20 MG tablet, Take 1 tablet (20 mg total) by mouth daily., Disp: 20 tablet, Rfl: 0 .  lisinopril (PRINIVIL,ZESTRIL) 40 MG tablet, TAKE 1 TABLET BY MOUTH EVERY DAY, Disp: 30 tablet, Rfl: 7 .  nitroGLYCERIN (NITROSTAT) 0.4 MG SL tablet, Place 1 tablet (0.4 mg total) under the tongue every 5 (five) minutes as needed for chest pain., Disp: 90 tablet, Rfl: 3 .  potassium chloride SA (K-DUR,KLOR-CON) 20 MEQ tablet, Take 1 tablet (20 mEq total) by mouth daily., Disp: 20 tablet, Rfl: 0 .  pravastatin (PRAVACHOL) 80 MG tablet, Take 1 tablet (80 mg total) by mouth daily., Disp: 90 tablet, Rfl: 1 .  ranitidine (ZANTAC) 75 MG tablet, Take 75 mg by mouth as needed for heartburn., Disp: , Rfl:  .  traZODone (DESYREL) 50 MG tablet, TAKE 0.5-1 TABLETS (25-50 MG TOTAL) BY MOUTH AT BEDTIME AS NEEDED FOR SLEEP., Disp: 90 tablet, Rfl:  0  No Known Allergies   Review of Systems   Pertinent items are noted in the HPI. Otherwise, ROS is negative.  Vitals:   Vitals:   08/05/17 0842  BP: (!) 158/66  Pulse: 63  Temp: 97.7 F (36.5 C)  TempSrc: Oral  SpO2: 94%  Weight: 166 lb 9.6 oz (75.6 kg)  Height: 5\' 10"  (1.778 m)     Body mass index is 23.9 kg/m.  Physical Exam:   Physical Exam  Constitutional: He is oriented to person, place, and time. He appears well-developed and well-nourished. No distress.  HENT:  Head: Normocephalic and atraumatic.  Right Ear: External ear normal.  Left Ear: External ear normal.  Nose: Nose normal.  Mouth/Throat: Oropharynx is clear and moist.  Eyes: Pupils are equal, round, and  reactive to light. Conjunctivae and EOM are normal.  Neck: Normal range of motion. Neck supple.  Cardiovascular: Normal rate, regular rhythm, normal heart sounds and intact distal pulses.  Pulmonary/Chest: Effort normal and breath sounds normal.  Abdominal: Soft. Bowel sounds are normal.  Musculoskeletal:       Right knee: He exhibits decreased range of motion and effusion. Tenderness found. Medial joint line and lateral joint line tenderness noted.  Neurological: He is alert and oriented to person, place, and time.  Skin: Skin is warm and dry.  Psychiatric: He has a normal mood and affect. His behavior is normal. Judgment and thought content normal.  Nursing note and vitals reviewed.   Assessment and Plan:   Diagnoses and all orders for this visit:  Acute pain of right knee Comments: Likely gout. Orders: -     Uric acid -     DG Knee 1-2 Views Right -     predniSONE (DELTASONE) 5 MG tablet; Take 1 tablet (5 mg total) by mouth daily with breakfast. 6-5-4-3-2-1-off -     methylPREDNISolone acetate (DEPO-MEDROL) injection 80 mg  Alcohol consumption of more than two drinks per day -     CBC with Differential/Platelet -     Comprehensive metabolic panel  CKD (chronic kidney disease) stage 3, GFR 30-59 ml/min (HCC) -     CBC with Differential/Platelet -     Comprehensive metabolic panel -     Magnesium -     Phosphorus  Anxiety -     ALPRAZolam (XANAX) 0.5 MG tablet; TAKE HALF TO 1 TABLET BY MOUTH AT BEDTIME AS NEEDED FOR ANXIETY.  Cancer of left lung parenchyma (HCC)  Pulmonary emphysema, unspecified emphysema type ([place])  Screening for lipid disorders -     Lipid panel  Essential hypertension -     lisinopril (PRINIVIL,ZESTRIL) 40 MG tablet; Take 1 tablet (40 mg total) by mouth daily. -     amLODipine (NORVASC) 10 MG tablet; Take 1 tablet (10 mg total) by mouth daily.  Pure hyperglyceridemia -     pravastatin (PRAVACHOL) 80 MG tablet; Take 1 tablet (80 mg total) by mouth  daily.  Anxiety Comments: Reviewed risks of medication. He understands. Orders: -     ALPRAZolam (XANAX) 0.5 MG tablet; TAKE HALF TO 1 TABLET BY MOUTH AT BEDTIME AS NEEDED FOR ANXIETY.    . Reviewed expectations re: course of current medical issues. . Discussed self-management of symptoms. . Outlined signs and symptoms indicating need for more acute intervention. . Patient verbalized understanding and all questions were answered. Marland Kitchen Health Maintenance issues including appropriate healthy diet, exercise, and smoking avoidance were discussed with patient. . See orders for this visit as  documented in the electronic medical record. . Patient received an After Visit Summary.  Briscoe Deutscher, DO [place], [facility] 08/05/2017  Future Appointments  Date Time Provider [place]  10/08/2017  8:30 AM Gardenia Phlegm, NP [facility] None  12/11/2017  9:00 AM Briscoe Deutscher, DO [facility]  12/11/2017 10:00 AM Williemae Area, RN [facility]

## 2017-09-10 ENCOUNTER — Telehealth: Payer: Self-pay | Admitting: Adult Health

## 2017-09-10 NOTE — Telephone Encounter (Signed)
Tried to reach regarding reschedule to 8/27

## 2017-10-02 ENCOUNTER — Other Ambulatory Visit: Payer: Self-pay | Admitting: Family Medicine

## 2017-10-02 DIAGNOSIS — G479 Sleep disorder, unspecified: Secondary | ICD-10-CM

## 2017-10-03 DIAGNOSIS — Z23 Encounter for immunization: Secondary | ICD-10-CM | POA: Diagnosis not present

## 2017-10-08 ENCOUNTER — Encounter: Payer: Medicare Other | Admitting: Adult Health

## 2017-10-13 ENCOUNTER — Encounter: Payer: Self-pay | Admitting: Adult Health

## 2017-10-13 ENCOUNTER — Inpatient Hospital Stay: Payer: Medicare Other | Attending: Adult Health | Admitting: Adult Health

## 2017-10-13 ENCOUNTER — Telehealth: Payer: Self-pay | Admitting: Adult Health

## 2017-10-13 DIAGNOSIS — Z85118 Personal history of other malignant neoplasm of bronchus and lung: Secondary | ICD-10-CM | POA: Insufficient documentation

## 2017-10-13 DIAGNOSIS — Z902 Acquired absence of lung [part of]: Secondary | ICD-10-CM | POA: Diagnosis not present

## 2017-10-13 DIAGNOSIS — Z87891 Personal history of nicotine dependence: Secondary | ICD-10-CM | POA: Insufficient documentation

## 2017-10-13 DIAGNOSIS — C3490 Malignant neoplasm of unspecified part of unspecified bronchus or lung: Secondary | ICD-10-CM

## 2017-10-13 DIAGNOSIS — Z9221 Personal history of antineoplastic chemotherapy: Secondary | ICD-10-CM | POA: Insufficient documentation

## 2017-10-13 NOTE — Progress Notes (Signed)
CLINIC:  Survivorship  REASON FOR VISIT:  Long-term survivorship surveillance visit for patient with history of lung cancer.   BRIEF ONCOLOGIC HISTORY:  Per Dr. 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   #1 status post 3 cycles of neoadjuvant chemotherapy with cisplatin and docetaxel. Last dose was given 12/20/2004 was significant response to the treatment.   #2 status post left pneumonectomy under the care of Dr. Arlyce Dice on 02/21/2005 and the final pathology was consistent with a stage II B. (T2, N1, MX) non-small cell lung cancer.   #3 status post 3 cycles of adjuvant chemotherapy withcarboplatin and paclitaxel. Last dose was given 05/20/2005.    INTERVAL HISTORY:  Odin is here today with his daughter for follow up of his history of lung cancer.  He is doing well today.  He remains active  He sees his PCP regularly.  He undergoes annual skin and prostate cancer screening.  He says he is in good health.  He lives alone and manages this without difficulty.  His daughter does help him when needed.      ADDITIONAL REVIEW OF SYSTEMS:  Review of Systems  Constitutional: Negative for appetite change, chills, fatigue, fever and unexpected weight change.  HENT:   Negative for hearing loss, lump/mass, sore throat and trouble swallowing.   Eyes: Negative for eye problems and icterus.  Respiratory: Negative for chest tightness, cough and shortness of breath.   Cardiovascular: Negative for chest pain, leg swelling and palpitations.  Gastrointestinal: Negative for abdominal distention, abdominal pain, constipation, diarrhea, nausea and vomiting.  Endocrine: Negative for hot flashes.  Skin: Negative for itching and rash.  Neurological: Negative for dizziness, extremity weakness, headaches and numbness.    Hematological: Negative for adenopathy. Does not bruise/bleed easily.  Psychiatric/Behavioral: Negative for depression. The patient is not nervous/anxious.       PAST MEDICAL & SURGICAL HISTORY:  Past Medical History:  Diagnosis Date  . Alcohol consumption of more than two drinks per day 10/10/2016  . Anxiety 10/10/2016  . Aortic atherosclerosis ([place]) 05/05/2017  . Cancer of left lung parenchyma ([place]) 04/23/2008  . Chronic hyponatremia 05/05/2017  . CKD (chronic kidney disease) stage 3, GFR 30-59 ml/min (HCC) 10/10/2016  . COPD with emphysema ([place]) 05/05/2017  . Coronary atherosclerosis 03/13/2006  . Essential hypertension 03/18/2006  . HLD (hyperlipidemia) 04/23/2008  . Squamous cell lung cancer ([place]) 2006   Past Surgical History:  Procedure Laterality Date  . CATARACT EXTRACTION, BILATERAL  2013  . CORONARY STENT PLACEMENT    . PNEUMONECTOMY      SOCIAL HISTORY:  Social History   Socioeconomic History  . Marital status: Widowed    Spouse name: Not on file  . Number of children: Not on file  . Years of education: Not on file  . Highest education level: Not on file  Occupational History  . Occupation: RETIRED  Social Needs  . Financial resource strain: Not on file  . Food insecurity:    Worry: Not on file    Inability: Not on file  . Transportation needs:    Medical: Not on file    Non-medical: Not on file  Tobacco Use  . Smoking status: Former Smoker    Packs/day: 1.50    Years: 20.00    Pack years: 30.00    Types: Cigarettes  Last attempt to quit: 02/18/1996    Years since quitting: 21.6  . Smokeless tobacco: Never Used  Substance and Sexual Activity  . Alcohol use: Yes    Alcohol/week: 14.0 standard drinks    Types: 14 Cans of beer per week    Comment: more when on vacation  . Drug use: No  . Sexual activity: Not on file  Lifestyle  . Physical activity:    Days per week: Not on file    Minutes per session: Not on file  . Stress: Not on file   Relationships  . Social connections:    Talks on phone: Not on file    Gets together: Not on file    Attends religious service: Not on file    Active member of club or organization: Not on file    Attends meetings of clubs or organizations: Not on file    Relationship status: Not on file  . Intimate partner violence:    Fear of current or ex partner: Not on file    Emotionally abused: Not on file    Physically abused: Not on file    Forced sexual activity: Not on file  Other Topics Concern  . Not on file  Social History Narrative  . Not on file      CURRENT MEDICATIONS:  Current Outpatient Medications on File Prior to Visit  Medication Sig Dispense Refill  . ALPRAZolam (XANAX) 0.5 MG tablet TAKE HALF TO 1 TABLET BY MOUTH AT BEDTIME AS NEEDED FOR ANXIETY. 90 tablet 0  . amLODipine (NORVASC) 10 MG tablet Take 1 tablet (10 mg total) by mouth daily. 90 tablet 3  . aspirin 81 MG tablet Take 81 mg by mouth daily.    Marland Kitchen lisinopril (PRINIVIL,ZESTRIL) 40 MG tablet Take 1 tablet (40 mg total) by mouth daily. 90 tablet 3  . pravastatin (PRAVACHOL) 80 MG tablet Take 1 tablet (80 mg total) by mouth daily. 90 tablet 3  . ranitidine (ZANTAC) 75 MG tablet Take 75 mg by mouth as needed for heartburn.    . traZODone (DESYREL) 50 MG tablet TAKE 0.5-1 TABLETS (25-50 MG TOTAL) BY MOUTH AT BEDTIME AS NEEDED FOR SLEEP. 90 tablet 0  . nitroGLYCERIN (NITROSTAT) 0.4 MG SL tablet Place 1 tablet (0.4 mg total) under the tongue every 5 (five) minutes as needed for chest pain. 90 tablet 3   No current facility-administered medications on file prior to visit.     ALLERGIES:  No Known Allergies  PHYSICAL EXAM:  Vitals:   10/13/17 1105  BP: (!) 165/67  Pulse: 66  Resp: 14  Temp: 97.7 F (36.5 C)  SpO2: 98%   Filed Weights   10/13/17 1105  Weight: 165 lb 14.4 oz (75.3 kg)    General: Well-nourished, well-appearing male in no acute distress.   HEENT: Head is atraumatic and normocephalic.  Pupils  equal and reactive to light. Conjunctivae clear without exudate.  Sclerae anicteric. Oral mucosa is pink and moist without lesions. Oropharynx is pink and moist, without lesions. Lymph: No cervical, supraclavicular, or supraclavicular lymphadenopathy noted on palpation.   Cardiovascular: Normal rate and rhythm. Respiratory: Clear to auscultation bilaterally. Chest expansion symmetric without accessory muscle use on inspiration or expiration.   GI: Abdomen soft and round. No tenderness to palpation. Bowel sounds normoactive in 4 quadrants. No hepatosplenomegaly.  GU: Deferred.   Neuro: No focal deficits. Steady gait.   Psych: Normal mood and affect for situation. Extremities: No edema, cyanosis, or clubbing.   Skin:  Warm and dry.    LABORATORY DATA:  None at this visit.  DIAGNOSTIC IMAGING:  None at this visit.    ASSESSMENT & PLAN:  Manpreet is an 83 year old man with h/o left sided stage IIIA NSCLC treated with neoadjuvant chemotherapy, pneumonectomy, and adjuvant chemotherapy, completing treatment in 05/2005.  Patient presents to survivorship clinic today for routine surveillance as a long-term cancer survivor.   1. History of lung cancer: Rudransh is doing well today.  He is without any clinical or radiographic sign of recurrence.  We reviewed the current guidelines, which support continued CT scans of his chest.  His last CT scan in August of last year was stable without evidence of recurrence.  We reviewed him skipping a year and doing a chest xray instead.  After discussion, and considering how healthy he is, he would like repeat CT chest.  I have ordered this to be done routinely, hopefully within the next week they will be able to accommodate him.  I will call his daughter with the results.  He will return in one year for his next f/u visit.  2. Health maintenance: Parsa will continue to f/u with Dr. Juleen China his PCP.  He was recommended to continue to stay active, and maintain a healthy  diet.  He will continue skin cancer and prostate cancer screening at Dr. Alcario Drought discretion.    Dispo:  -CT chest (next available) -LTS f/u in one year -Continued f/u with PCP  A total of (30) minutes of face-to-face time was spent with this patient with greater than 50% of that time in counseling and care-coordination.    Charlestine Massed, NP [place] [phone]

## 2017-10-13 NOTE — Telephone Encounter (Signed)
Gave pt avs and calendar  °

## 2017-10-21 ENCOUNTER — Ambulatory Visit (HOSPITAL_COMMUNITY)
Admission: RE | Admit: 2017-10-21 | Discharge: 2017-10-21 | Disposition: A | Discharge status: Home / Self Care | Payer: Medicare Other | Source: Ambulatory Visit | Attending: Adult Health | Admitting: Adult Health

## 2017-10-21 ENCOUNTER — Encounter (HOSPITAL_COMMUNITY): Payer: Self-pay

## 2017-10-21 DIAGNOSIS — I700 Atherosclerosis of aorta: Secondary | ICD-10-CM | POA: Diagnosis not present

## 2017-10-21 DIAGNOSIS — C3490 Malignant neoplasm of unspecified part of unspecified bronchus or lung: Secondary | ICD-10-CM | POA: Diagnosis not present

## 2017-10-21 DIAGNOSIS — J438 Other emphysema: Secondary | ICD-10-CM | POA: Insufficient documentation

## 2017-10-22 ENCOUNTER — Other Ambulatory Visit: Payer: Self-pay | Admitting: Adult Health

## 2017-10-22 DIAGNOSIS — R911 Solitary pulmonary nodule: Secondary | ICD-10-CM

## 2017-10-22 NOTE — Progress Notes (Signed)
Called and reviewed results with patient daughter.  Let her know that I reviewed CT with Dr. Julien Nordmann.  Explained the RUL nodule and that [name] will need repeat CT chest in 3 months.  [name] Bihari, NP

## 2017-11-11 ENCOUNTER — Other Ambulatory Visit: Payer: Self-pay | Admitting: Family Medicine

## 2017-11-11 DIAGNOSIS — F419 Anxiety disorder, unspecified: Secondary | ICD-10-CM

## 2017-11-11 NOTE — Telephone Encounter (Signed)
Please advise on refill. Patient has appointment on 12/11/17.

## 2017-12-04 ENCOUNTER — Ambulatory Visit: Payer: Medicare Other | Admitting: *Deleted

## 2017-12-04 ENCOUNTER — Other Ambulatory Visit: Payer: Medicare Other

## 2017-12-11 ENCOUNTER — Encounter: Payer: Self-pay | Admitting: Family Medicine

## 2017-12-11 ENCOUNTER — Ambulatory Visit (INDEPENDENT_AMBULATORY_CARE_PROVIDER_SITE_OTHER): Payer: Medicare Other | Admitting: Family Medicine

## 2017-12-11 ENCOUNTER — Ambulatory Visit: Payer: Medicare Other

## 2017-12-11 VITALS — BP 146/72 | HR 66 | Temp 97.6°F | Ht 70.0 in | Wt 166.8 lb

## 2017-12-11 DIAGNOSIS — I10 Essential (primary) hypertension: Secondary | ICD-10-CM | POA: Diagnosis not present

## 2017-12-11 DIAGNOSIS — G479 Sleep disorder, unspecified: Secondary | ICD-10-CM

## 2017-12-11 DIAGNOSIS — F419 Anxiety disorder, unspecified: Secondary | ICD-10-CM | POA: Diagnosis not present

## 2017-12-11 DIAGNOSIS — E781 Pure hyperglyceridemia: Secondary | ICD-10-CM | POA: Diagnosis not present

## 2017-12-11 DIAGNOSIS — N183 Chronic kidney disease, stage 3 unspecified: Secondary | ICD-10-CM

## 2017-12-11 DIAGNOSIS — I2510 Atherosclerotic heart disease of native coronary artery without angina pectoris: Secondary | ICD-10-CM

## 2017-12-11 LAB — COMPREHENSIVE METABOLIC PANEL
ALT: 12 U/L (ref 0–53)
AST: 20 U/L (ref 0–37)
Albumin: 4.5 g/dL (ref 3.5–5.2)
Alkaline Phosphatase: 49 U/L (ref 39–117)
BUN: 18 mg/dL (ref 6–23)
CO2: 27 mEq/L (ref 19–32)
Calcium: 10.2 mg/dL (ref 8.4–10.5)
Chloride: 99 mEq/L (ref 96–112)
Creatinine, Ser: 1.39 mg/dL (ref 0.40–1.50)
GFR: 52.07 mL/min — ABNORMAL LOW (ref >60.00)
Glucose, Bld: 104 mg/dL — ABNORMAL HIGH (ref 70–99)
Potassium: 5.1 mEq/L (ref 3.5–5.1)
Sodium: 135 mEq/L (ref 135–145)
Total Bilirubin: 0.7 mg/dL (ref 0.2–1.2)
Total Protein: 7.6 g/dL (ref 6.0–8.3)

## 2017-12-11 LAB — CBC WITH DIFFERENTIAL/PLATELET
Basophils Absolute: 0 10*3/uL (ref 0.0–0.1)
Basophils Relative: 0.7 % (ref 0.0–3.0)
Eosinophils Absolute: 0.1 10*3/uL (ref 0.0–0.7)
Eosinophils Relative: 1.2 % (ref 0.0–5.0)
HCT: 45.6 % (ref 39.0–52.0)
Hemoglobin: 15.3 g/dL (ref 13.0–17.0)
Lymphocytes Relative: 19.2 % (ref 12.0–46.0)
Lymphs Abs: 1.3 10*3/uL (ref 0.7–4.0)
MCHC: 33.4 g/dL (ref 30.0–36.0)
MCV: 97.7 fl (ref 78.0–100.0)
Monocytes Absolute: 0.8 10*3/uL (ref 0.1–1.0)
Monocytes Relative: 12.2 % — ABNORMAL HIGH (ref 3.0–12.0)
Neutro Abs: 4.6 10*3/uL (ref 1.4–7.7)
Neutrophils Relative %: 66.7 % (ref 43.0–77.0)
Platelets: 289 10*3/uL (ref 150.0–400.0)
RBC: 4.67 Mil/uL (ref 4.22–5.81)
RDW: 13.9 % (ref 11.5–15.5)
WBC: 6.9 10*3/uL (ref 4.0–10.5)

## 2017-12-11 MED ORDER — QUETIAPINE FUMARATE 25 MG PO TABS: 25.0000 mg | ORAL_TABLET | Freq: Every day | ORAL | 1 refills | Status: DC

## 2017-12-11 MED ORDER — ALPRAZOLAM 0.5 MG PO TABS: ORAL_TABLET | ORAL | 0 refills | Status: DC

## 2017-12-11 NOTE — Progress Notes (Signed)
[name] [name] is a 83 y.o. male is here for follow up.  History of Present Illness:   HPI:   1. Disturbance in sleep behavior. Still having trouble going to and staying asleep. Taking Trazodone and Xanax at night. Not napping during the day. Daughter says that he always looks tired.    2. CKD (chronic kidney disease) stage 3, GFR 30-59 ml/min ([name] [name]). Followed by Dr. Jimmy Footman.  Lab Results  Component Value Date   CREATININE 1.39 12/11/2017   CREATININE 1.25 08/05/2017   CREATININE 1.33 02/02/2017    3. Essential hypertension.   Review: taking medications as instructed, no medication side effects noted, no TIAs, no chest pain on exertion, no dyspnea on exertion, no swelling of ankles.  Smoker: No.  Wt Readings from Last 3 Encounters:  12/11/17 166 lb 12.8 oz (75.7 kg)  10/13/17 165 lb 14.4 oz (75.3 kg)  08/05/17 166 lb 9.6 oz (75.6 kg)   BP Readings from Last 3 Encounters:  12/11/17 (!) 146/72  10/13/17 (!) 165/67  08/05/17 (!) 158/66   Lab Results  Component Value Date   CREATININE 1.39 12/11/2017     4. Pure hypertriglyceridemia.   Is the patient taking medications without problems? [x]   YES  []   NO Does the patient complain of muscle aches?   []   YES  [x]    NO Trying to exercise on a regular basis? []   YES  [x]   NO Compliant with diet? []   YES  [x]   NO   Lipids:    Component Value Date/Time   CHOL 207 (H) 08/05/2017 0855   CHOL 204 (H) 08/25/2016 1001   TRIG 48.0 08/05/2017 0855   HDL 110.50 08/05/2017 0855   HDL 125 08/25/2016 1001   LDLDIRECT 78.8 09/10/2011 0801   VLDL 9.6 08/05/2017 0855   CHOLHDL 2 08/05/2017 0855     There are no preventive care reminders to display for this patient. Depression screen [name] [name] 2/9 12/11/2017 12/03/2016 10/07/2016  Decreased Interest 0 0 0  Down, Depressed, Hopeless 0 0 0  PHQ - 2 Score 0 0 0   PMHx, SurgHx, SocialHx, FamHx, Medications, and Allergies were reviewed in the Visit Navigator and updated as appropriate.    Patient Active Problem List   Diagnosis Date Noted  . Chronic hyponatremia 05/05/2017  . Aortic atherosclerosis ([name] [name]) 05/05/2017  . COPD with emphysema ([name] [name]) 05/05/2017  . CKD (chronic kidney disease) stage 3, GFR 30-59 ml/min ([name] [name]) 10/10/2016  . Alcohol consumption of more than two drinks per day 10/10/2016  . Anxiety 10/10/2016  . Disturbance in sleep behavior 02/08/2009  . Cancer of left lung parenchyma ([name] [name]) 04/23/2008  . HLD (hyperlipidemia) 04/23/2008  . Essential hypertension 03/18/2006  . Coronary atherosclerosis 03/13/2006   Social History   Tobacco Use  . Smoking status: Former Smoker    Packs/day: 1.50    Years: 20.00    Pack years: 30.00    Types: Cigarettes    Last attempt to quit: 02/18/1996    Years since quitting: 21.8  . Smokeless tobacco: Never Used  Substance Use Topics  . Alcohol use: Yes    Alcohol/week: 14.0 standard drinks    Types: 14 Cans of beer per week    Comment: more when on vacation  . Drug use: No   Current Medications and Allergies:   .  ALPRAZolam (XANAX) 0.5 MG tablet, TAKE 1/2 TO 1 TABLET BY MOUTH AT BEDTIME AS NEEDED FOR ANXIETY, Disp: 90 tablet, Rfl: 0 .  aspirin 81 MG tablet, Take 81 mg by mouth daily., Disp: , Rfl:  .  pravastatin (PRAVACHOL) 80 MG tablet, Take 1 tablet (80 mg total) by mouth daily., Disp: 90 tablet, Rfl: 3 .  amLODipine (NORVASC) 10 MG tablet, Take 1 tablet (10 mg total) by mouth daily., Disp: 90 tablet, Rfl: 3 .  lisinopril (PRINIVIL,ZESTRIL) 40 MG tablet, Take 1 tablet (40 mg total) by mouth daily., Disp: 90 tablet, Rfl: 3 .  nitroGLYCERIN (NITROSTAT) 0.4 MG SL tablet, Place 1 tablet (0.4 mg total) under the tongue every 5 (five) minutes as needed for chest pain., Disp: 90 tablet, Rfl: 3  No Known Allergies   Review of Systems   Pertinent items are noted in the HPI. Otherwise, ROS is negative.  Vitals:   Vitals:   12/11/17 0851  BP: (!) 146/72  Pulse: 66  Temp: 97.6 F (36.4 C)  TempSrc: Oral  SpO2:  95%  Weight: 166 lb 12.8 oz (75.7 kg)  Height: 5\' 10"  (1.778 m)     Body mass index is 23.93 kg/m.  Physical Exam:   Physical Exam  Constitutional: He is oriented to person, place, and time. He appears well-developed and well-nourished. No distress.  HENT:  Head: Normocephalic and atraumatic.  Right Ear: External ear normal.  Left Ear: External ear normal.  Nose: Nose normal.  Mouth/Throat: Oropharynx is clear and moist.  Eyes: Pupils are equal, round, and reactive to light. Conjunctivae and EOM are normal.  Neck: Normal range of motion. Neck supple.  Cardiovascular: Normal rate, regular rhythm, normal heart sounds and intact distal pulses.  Pulmonary/Chest: Effort normal and breath sounds normal.  Abdominal: Soft. Bowel sounds are normal.  Musculoskeletal:       Right knee: He exhibits decreased range of motion. He exhibits no effusion. Tenderness found. Medial joint line and lateral joint line tenderness noted.  Neurological: He is alert and oriented to person, place, and time.  Skin: Skin is warm and dry.  Psychiatric: He has a normal mood and affect. His behavior is normal. Judgment and thought content normal.  Nursing note and vitals reviewed.  Assessment and Plan:   Diagnoses and all orders for this visit:  Disturbance in sleep behavior Comments: We have tried a few medications. Discussed Seroquel at low dose with patient and his daughter. Will stop Trazodone. Trial Seroquel.  Orders: -     QUEtiapine (SEROQUEL) 25 MG tablet; Take 1 tablet (25 mg total) by mouth at bedtime.  CKD (chronic kidney disease) stage 3, GFR 30-59 ml/min ([name] [name]) -     CBC with Differential/Platelet -     Comprehensive metabolic panel  Essential hypertension  Pure hypertriglyceridemia  Atherosclerosis of native coronary artery of native heart without angina pectoris  Anxiety Comments: Continues BID use. Will see if Seroquel helps and wean benzo if it does.  Orders: -     ALPRAZolam  (XANAX) 0.5 MG tablet; TAKE 1/2 TO 1 TABLET BY MOUTH AT BEDTIME AS NEEDED FOR ANXIETY  . Reviewed expectations re: course of current medical issues. . Discussed self-management of symptoms. . Outlined signs and symptoms indicating need for more acute intervention. . Patient verbalized understanding and all questions were answered. Marland Kitchen Health Maintenance issues including appropriate healthy diet, exercise, and smoking avoidance were discussed with patient. . See orders for this visit as documented in the electronic medical record. . Patient received an After Visit Summary.  Briscoe Deutscher, DO [place], [facility] 12/13/2017

## 2017-12-29 ENCOUNTER — Other Ambulatory Visit: Payer: Self-pay | Admitting: Family Medicine

## 2017-12-29 DIAGNOSIS — G479 Sleep disorder, unspecified: Secondary | ICD-10-CM

## 2018-01-02 ENCOUNTER — Other Ambulatory Visit: Payer: Self-pay | Admitting: Family Medicine

## 2018-01-02 DIAGNOSIS — G479 Sleep disorder, unspecified: Secondary | ICD-10-CM

## 2018-01-04 NOTE — Telephone Encounter (Signed)
Last app 12/11/17 Last script 12/11/17 #30 1 rf  F/u 03/17/17

## 2018-01-05 ENCOUNTER — Telehealth: Payer: Self-pay

## 2018-01-05 NOTE — Telephone Encounter (Signed)
Please call to see if he is taking the Seroquel prescribed at our previous visit. Does he feel that he needs both? May want to talk to his daughter.

## 2018-01-05 NOTE — Telephone Encounter (Signed)
Fax received for refill on trazodone 50mg  [facility] prn from pharmacy. Ok to fill?

## 2018-01-06 NOTE — Telephone Encounter (Signed)
Left message to return call to our office.  

## 2018-01-06 NOTE — Telephone Encounter (Signed)
Pt's daughter returned Joellen's call. She says that pt said the other day that he isn't feeling any different and feels that the Seroquel is the same as Trazodone. Pt's daughter says that she feels that he may be dosing some during the day and not noticing it. She advised pt to give new medication more time to help.  Advised her to keep PCP informed on if medication is helping. Daughter says that pt is not still currently taking Trazodone but she will let PCP know if any changes are needed.

## 2018-01-07 NOTE — Telephone Encounter (Signed)
fyi

## 2018-01-21 ENCOUNTER — Telehealth: Payer: Self-pay

## 2018-01-21 ENCOUNTER — Ambulatory Visit (HOSPITAL_COMMUNITY)
Admission: RE | Admit: 2018-01-21 | Discharge: 2018-01-21 | Disposition: A | Discharge status: Home / Self Care | Payer: Medicare Other | Source: Ambulatory Visit | Attending: Adult Health | Admitting: Adult Health

## 2018-01-21 DIAGNOSIS — R918 Other nonspecific abnormal finding of lung field: Secondary | ICD-10-CM | POA: Diagnosis not present

## 2018-01-21 DIAGNOSIS — R911 Solitary pulmonary nodule: Secondary | ICD-10-CM

## 2018-01-21 MED ORDER — SODIUM CHLORIDE (PF) 0.9 % IJ SOLN
INTRAMUSCULAR | Status: AC
  Filled 2018-01-21: qty 50

## 2018-01-21 MED ORDER — IOHEXOL 300 MG/ML  SOLN
75.0000 mL | Freq: Once | INTRAMUSCULAR | Status: AC | PRN
  Administered 2018-01-21: 75 mL via INTRAVENOUS

## 2018-01-21 NOTE — Telephone Encounter (Signed)
Spoke with patient's sister, [name], informed of CT results after Thedore Mins, NP reviewed.  Patient's sister voiced understanding.  No further needs at this time.

## 2018-01-21 NOTE — Telephone Encounter (Signed)
Left message for patient to call back regarding results of CT scan.

## 2018-02-12 ENCOUNTER — Other Ambulatory Visit: Payer: Self-pay | Admitting: Family Medicine

## 2018-02-12 DIAGNOSIS — F419 Anxiety disorder, unspecified: Secondary | ICD-10-CM

## 2018-02-12 NOTE — Telephone Encounter (Signed)
12/11/2017 last o/v  F/u 03/17/2017  Last script 12/11/17 #90 no r/f

## 2018-03-17 ENCOUNTER — Ambulatory Visit: Payer: Medicare Other | Admitting: Family Medicine

## 2018-03-19 ENCOUNTER — Ambulatory Visit (INDEPENDENT_AMBULATORY_CARE_PROVIDER_SITE_OTHER): Payer: Medicare Other

## 2018-03-19 ENCOUNTER — Ambulatory Visit: Payer: Self-pay

## 2018-03-19 ENCOUNTER — Encounter: Payer: Self-pay | Admitting: Family Medicine

## 2018-03-19 ENCOUNTER — Ambulatory Visit (INDEPENDENT_AMBULATORY_CARE_PROVIDER_SITE_OTHER): Payer: Medicare Other | Admitting: Family Medicine

## 2018-03-19 VITALS — BP 142/68 | HR 71 | Temp 98.3°F | Ht 70.0 in | Wt 172.0 lb

## 2018-03-19 DIAGNOSIS — S2232XA Fracture of one rib, left side, initial encounter for closed fracture: Secondary | ICD-10-CM | POA: Diagnosis not present

## 2018-03-19 DIAGNOSIS — S2231XA Fracture of one rib, right side, initial encounter for closed fracture: Secondary | ICD-10-CM | POA: Diagnosis not present

## 2018-03-19 DIAGNOSIS — S2242XA Multiple fractures of ribs, left side, initial encounter for closed fracture: Secondary | ICD-10-CM | POA: Diagnosis not present

## 2018-03-19 MED ORDER — HYDROCODONE-ACETAMINOPHEN 5-325 MG PO TABS: 1.0000 | ORAL_TABLET | Freq: Four times a day (QID) | ORAL | 0 refills | Status: DC | PRN

## 2018-03-19 NOTE — Telephone Encounter (Signed)
See note

## 2018-03-19 NOTE — Telephone Encounter (Signed)
Noted  

## 2018-03-19 NOTE — Progress Notes (Signed)
Patient: [name] [name] MRN: [id number] DOB: 1935/01/21 PCP: Briscoe Deutscher, DO     Subjective:  Chief Complaint  Patient presents with  . L sided pain    pain radiates to back-s/p fall on 1/28.    HPI: The patient is a 84 y.o. male who presents today for left sided rib pain. On Tuesday night he got up to use the bathroom. He was standing up and flushed the toilet and all of a sudden fell and came down on the toilet on his left side. He denies any light headedness, dizziness, chest pain/palpitations before he fell. He had no syncope. Did have a very large BM on toilet before standing. The next day he felt okay, but first thing this AM he states it was hurting pretty bad and couldn't sleep last night. He can't lay on either side without pain. No shortness of breath or problems breathing. His denies any stomach pain or bloating. He has not had a BM since he fell. Pain is a 10/10 at night, doing better now.   Review of Systems  Respiratory: Negative for shortness of breath.   Cardiovascular: Negative for chest pain.  Gastrointestinal: Negative for abdominal pain and nausea.  Musculoskeletal: Positive for arthralgias and back pain. Negative for neck pain.       C/o left sided rib pain that radiates to back  Neurological: Negative for dizziness and headaches.    Allergies Patient has No Known Allergies.  Past Medical History Patient  has a past medical history of Alcohol consumption of more than two drinks per day (10/10/2016), Anxiety (10/10/2016), Aortic atherosclerosis ([place]) (05/05/2017), Cancer of left lung parenchyma ([place]) (04/23/2008), Chronic hyponatremia (05/05/2017), CKD (chronic kidney disease) stage 3, GFR 30-59 ml/min ([place]) (10/10/2016), COPD with emphysema ([place]) (05/05/2017), Coronary atherosclerosis (03/13/2006), Essential hypertension (03/18/2006), HLD (hyperlipidemia) (04/23/2008), and Squamous cell lung cancer ([place]) (2006).  Surgical History Patient  has a past surgical history that  includes Pneumonectomy; Cataract extraction, bilateral (2013); and Coronary stent placement.  Family History Pateint's family history includes Heart attack (age of onset: 75) in his father; Heart attack (age of onset: 63) in his mother; Hyperlipidemia in his brother.  Social History Patient  reports that he quit smoking about 22 years ago. His smoking use included cigarettes. He has a 30.00 pack-year smoking history. He has never used smokeless tobacco. He reports current alcohol use of about 14.0 standard drinks of alcohol per week. He reports that he does not use drugs.    Objective: Vitals:   03/19/18 1329  BP: (!) 142/68  Pulse: 71  Temp: 98.3 F (36.8 C)  TempSrc: Temporal  SpO2: 98%  Weight: 172 lb (78 kg)  Height: 5\' 10"  (1.778 m)    Body mass index is 24.68 kg/m.  Physical Exam Vitals signs reviewed.  Constitutional:      General: He is not in acute distress.    Appearance: Normal appearance.  Cardiovascular:     Rate and Rhythm: Normal rate and regular rhythm.     Heart sounds: Normal heart sounds.  Pulmonary:     Effort: Pulmonary effort is normal. No respiratory distress.     Breath sounds: No wheezing or rales.     Comments: No breath sounds in left lung field due to lung resection.  Abdominal:     General: Abdomen is flat. Bowel sounds are normal. There is no distension.     Palpations: Abdomen is soft.     Tenderness: There is no abdominal tenderness.  Musculoskeletal:     Comments: TTP over left latero-posterior ribs around T8-T10. No bruising or swelling   Neurological:     Mental Status: He is alert.       Thoracic xray: negative Rib xray: acute left 9th/10th lateral rib fractures. Mildly displaced.   Assessment/plan: 1. Closed fracture of multiple ribs of left side, initial encounter Has 2 mildly displaced left rib fractures. No worry for pneumothorax as his entire left lung has been resected. Did discuss spleen laceration, but he has no abdominal  pain and is in no acute distress. Will do conservative therapy with pain management. Continue tylenol during the day, ice or heating pad and will send in norco for him to take, especially at night. Very strict instruction written down to NOT take xanax and norco together as increases risk of respiratory suppression. He also states seroquel not working for sleep and has been on it nearly 3 months. Would just stop this and he has f/u with pcp. On very low dose, no titration needed. They do not want to take with norco, so again, im fine stopping this. Very strict precautions given for fever, worsening pain, difficulty breathing or pain in abdomen to go to ER. Discussed this can take up to 2 months to heal. If pain still unbearable please let pcp know.    No syncopal episode. Concerning why he fell, but likely secondary to vagal response with large BM then standing.  - DG Thoracic Spine W/Swimmers - DG Ribs Unilateral W/Chest Left   Return if symptoms worsen or fail to improve.    Orma Flaming, MD [place]   03/19/2018

## 2018-03-19 NOTE — Telephone Encounter (Signed)
Pt. Called to report a fall on 1/28; he fell backwards after going to bathroom and hit left side on the toilet.  Denied hitting head or losing consciousness.  Reported left side pain radiates around to the back; the pain is worse with movement in getting in and out of bed, or with moving his left arm. Stated the pain lessens with rest.  Denied pain with deep breath or shortness of breath.  Denied any swelling or bruising at injury site.  Called [facility]; spoke with [facility].  Approved to add on to another provider's schedule as PCP does not have any opening.  Scheduled an appt. At 1:40 PM.  Care advice given per protocol.  Verb. Understanding.  Agrees with plan.      Reason for Disposition . MODERATE pain (e.g., interferes with normal activities or awakens from sleep)    Recent fall; fell backwards and hit his left side; pain is better at rest and increases in severity with movement.  Answer Assessment - Initial Assessment Questions 1. LOCATION: "Where does it hurt?" (e.g., left, right)     Left flank and back pain  2. ONSET: "When did the pain start?"     03/16/18 3. SEVERITY: "How bad is the pain?" (e.g., Scale 1-10; mild, moderate, or severe)   - MILD (1-3): doesn't interfere with normal activities    - MODERATE (4-7): interferes with normal activities or awakens from sleep    - SEVERE (8-10): excruciating pain and patient unable to do normal activities (stays in bed)       Severe with certain movements  4. PATTERN: "Does the pain come and go, or is it constant?"      Intermittent; increases with trying to get up or in moving arm   5. CAUSE: "What do you think is causing the pain?"     Fell backwards and hit toilet 1/28 6. OTHER SYMPTOMS:  "Do you have any other symptoms?" (e.g., fever, abdominal pain, vomiting, leg weakness, burning with urination, blood in urine)     Denied shortness of breath or difficulty taking a deep breath 7. PREGNANCY:  "Is there any chance you are pregnant?" "When was your  last menstrual period?"     N/a  Protocols used: FLANK PAIN-A-AH

## 2018-03-19 NOTE — Patient Instructions (Addendum)
Do not take xanax with your norco (pain medication!)    Rib Fracture  A rib fracture is a break or crack in one of the bones of the ribs. The ribs are like a cage that goes around your upper chest. A broken or cracked rib is often painful, but most do not cause other problems. Most rib fractures usually heal on their own in 1-3 months. Follow these instructions at home: Managing pain, stiffness, and swelling  If directed, apply ice to the injured area. ? Put ice in a plastic bag. ? Place a towel between your skin and the bag. ? Leave the ice on for 20 minutes, 2-3 times a day.  Take over-the-counter and prescription medicines only as told by your doctor. Activity  Avoid activities that cause pain to the injured area. Protect your injured area.  Slowly increase activity as told by your doctor. General instructions  Do deep breathing as told by your doctor. You may be told to: ? Take deep breaths many times a day. ? Cough many times a day while hugging a pillow. ? Use a device (incentive spirometer) to do deep breathing many times a day.  Drink enough fluid to keep your pee (urine) clear or pale yellow.  Do not wear a rib belt or binder. These do not allow you to breathe deeply.  Keep all follow-up visits as told by your doctor. This is important. Contact a doctor if:  You have a fever. Get help right away if:  You have trouble breathing.  You are short of breath.  You cannot stop coughing.  You cough up thick or bloody spit (sputum).  You feel sick to your stomach (nauseous), throw up (vomit), or have belly (abdominal) pain.  Your pain gets worse and medicine does not help. Summary  A rib fracture is a break or crack in one of the bones of the ribs.  Apply ice to the injured area and take medicines for pain as told by your doctor.  Take deep breaths and cough many times a day. Hug a pillow every time you cough. This information is not intended to replace advice  given to you by your health care provider. Make sure you discuss any questions you have with your health care provider. Document Released: 11/13/2007 Document Revised: 05/06/2016 Document Reviewed: 05/06/2016 Elsevier Interactive Patient Education  2019 [employer].

## 2018-04-03 ENCOUNTER — Other Ambulatory Visit: Payer: Self-pay | Admitting: Family Medicine

## 2018-04-03 DIAGNOSIS — G479 Sleep disorder, unspecified: Secondary | ICD-10-CM

## 2018-04-07 ENCOUNTER — Ambulatory Visit (INDEPENDENT_AMBULATORY_CARE_PROVIDER_SITE_OTHER): Payer: Medicare Other | Admitting: Family Medicine

## 2018-04-07 ENCOUNTER — Encounter: Payer: Self-pay | Admitting: Family Medicine

## 2018-04-07 VITALS — BP 138/64 | HR 66 | Ht 70.0 in | Wt 173.4 lb

## 2018-04-07 DIAGNOSIS — G479 Sleep disorder, unspecified: Secondary | ICD-10-CM

## 2018-04-07 DIAGNOSIS — N183 Chronic kidney disease, stage 3 unspecified: Secondary | ICD-10-CM

## 2018-04-07 DIAGNOSIS — S2242XD Multiple fractures of ribs, left side, subsequent encounter for fracture with routine healing: Secondary | ICD-10-CM

## 2018-04-07 DIAGNOSIS — I10 Essential (primary) hypertension: Secondary | ICD-10-CM

## 2018-04-07 DIAGNOSIS — E781 Pure hyperglyceridemia: Secondary | ICD-10-CM | POA: Diagnosis not present

## 2018-04-07 DIAGNOSIS — F419 Anxiety disorder, unspecified: Secondary | ICD-10-CM | POA: Diagnosis not present

## 2018-04-07 LAB — COMPREHENSIVE METABOLIC PANEL
ALT: 10 U/L (ref 0–53)
AST: 16 U/L (ref 0–37)
Albumin: 4.1 g/dL (ref 3.5–5.2)
Alkaline Phosphatase: 49 U/L (ref 39–117)
BUN: 23 mg/dL (ref 6–23)
CO2: 27 mEq/L (ref 19–32)
Calcium: 9.5 mg/dL (ref 8.4–10.5)
Chloride: 99 mEq/L (ref 96–112)
Creatinine, Ser: 1.51 mg/dL — ABNORMAL HIGH (ref 0.40–1.50)
GFR: 44.49 mL/min — ABNORMAL LOW (ref >60.00)
Glucose, Bld: 177 mg/dL — ABNORMAL HIGH (ref 70–99)
Potassium: 4.3 mEq/L (ref 3.5–5.1)
Sodium: 134 mEq/L — ABNORMAL LOW (ref 135–145)
Total Bilirubin: 0.4 mg/dL (ref 0.2–1.2)
Total Protein: 6.9 g/dL (ref 6.0–8.3)

## 2018-04-07 LAB — LIPID PANEL
Cholesterol: 170 mg/dL (ref 0–200)
HDL: 67.5 mg/dL (ref >39.00)
LDL Cholesterol: 92 mg/dL (ref 0–99)
NonHDL: 102.54
Total CHOL/HDL Ratio: 3
Triglycerides: 53 mg/dL (ref 0.0–149.0)
VLDL: 10.6 mg/dL (ref 0.0–40.0)

## 2018-04-07 LAB — CBC WITH DIFFERENTIAL/PLATELET
Basophils Absolute: 0 10*3/uL (ref 0.0–0.1)
Basophils Relative: 0.7 % (ref 0.0–3.0)
Eosinophils Absolute: 0.1 10*3/uL (ref 0.0–0.7)
Eosinophils Relative: 1.9 % (ref 0.0–5.0)
HCT: 42 % (ref 39.0–52.0)
Hemoglobin: 14.4 g/dL (ref 13.0–17.0)
Lymphocytes Relative: 20 % (ref 12.0–46.0)
Lymphs Abs: 1.4 10*3/uL (ref 0.7–4.0)
MCHC: 34.3 g/dL (ref 30.0–36.0)
MCV: 94.4 fl (ref 78.0–100.0)
Monocytes Absolute: 0.5 10*3/uL (ref 0.1–1.0)
Monocytes Relative: 7.3 % (ref 3.0–12.0)
Neutro Abs: 5.1 10*3/uL (ref 1.4–7.7)
Neutrophils Relative %: 70.1 % (ref 43.0–77.0)
Platelets: 293 10*3/uL (ref 150.0–400.0)
RBC: 4.45 Mil/uL (ref 4.22–5.81)
RDW: 14.3 % (ref 11.5–15.5)
WBC: 7.2 10*3/uL (ref 4.0–10.5)

## 2018-04-07 MED ORDER — HYDROCODONE-ACETAMINOPHEN 5-325 MG PO TABS: 1.0000 | ORAL_TABLET | Freq: Four times a day (QID) | ORAL | 0 refills | Status: DC | PRN

## 2018-04-07 MED ORDER — ALPRAZOLAM 0.5 MG PO TABS: 0.5000 mg | ORAL_TABLET | Freq: Every evening | ORAL | 0 refills | Status: DC | PRN

## 2018-04-07 MED ORDER — AMLODIPINE BESYLATE 10 MG PO TABS: 10.0000 mg | ORAL_TABLET | Freq: Every day | ORAL | 3 refills | Status: DC

## 2018-04-07 MED ORDER — PRAVASTATIN SODIUM 80 MG PO TABS: 80.0000 mg | ORAL_TABLET | Freq: Every day | ORAL | 3 refills | Status: DC

## 2018-04-07 MED ORDER — LISINOPRIL 40 MG PO TABS: 40.0000 mg | ORAL_TABLET | Freq: Every day | ORAL | 3 refills | Status: DC

## 2018-04-07 MED ORDER — QUETIAPINE FUMARATE 25 MG PO TABS: 25.0000 mg | ORAL_TABLET | Freq: Every day | ORAL | 3 refills | Status: DC

## 2018-04-07 NOTE — Progress Notes (Signed)
[name] [name] is a 84 y.o. male is here for follow up.  History of Present Illness:   HPI: See Assessment and Plan section for Problem Based Charting of issues discussed today.  There are no preventive care reminders to display for this patient. Depression screen [facility] 2/9 04/07/2018 12/11/2017 12/03/2016  Decreased Interest 0 0 0  Down, Depressed, Hopeless 0 0 0  PHQ - 2 Score 0 0 0  Altered sleeping 1 - -  Tired, decreased energy 0 - -  Change in appetite 0 - -  Feeling bad or failure about yourself  0 - -  Trouble concentrating 0 - -  Moving slowly or fidgety/restless 0 - -  Suicidal thoughts 0 - -  PHQ-9 Score 1 - -  Difficult doing work/chores Not difficult at all - -   PMHx, SurgHx, SocialHx, FamHx, Medications, and Allergies were reviewed in the Visit Navigator and updated as appropriate.   Patient Active Problem List   Diagnosis Date Noted  . Pure hyperglyceridemia 04/08/2018  . Chronic hyponatremia 05/05/2017  . Aortic atherosclerosis ([place]) 05/05/2017  . COPD with emphysema ([place]) 05/05/2017  . CKD (chronic kidney disease) stage 3, GFR 30-59 ml/min ([facility]) 10/10/2016  . Alcohol consumption of more than two drinks per day 10/10/2016  . Anxiety 10/10/2016  . Disturbance in sleep behavior 02/08/2009  . Cancer of left lung parenchyma ([place]) 04/23/2008  . HLD (hyperlipidemia) 04/23/2008  . Essential hypertension 03/18/2006  . Coronary atherosclerosis 03/13/2006   Social History   Tobacco Use  . Smoking status: Former Smoker    Packs/day: 1.50    Years: 20.00    Pack years: 30.00    Types: Cigarettes    Last attempt to quit: 02/18/1996    Years since quitting: 22.1  . Smokeless tobacco: Never Used  Substance Use Topics  . Alcohol use: Yes    Alcohol/week: 14.0 standard drinks    Types: 14 Cans of beer per week    Comment: more when on vacation  . Drug use: No   Current Medications and Allergies   Current Outpatient Medications:  .  ALPRAZolam (XANAX) 0.5 MG  tablet, TAKE 1/2 TO 1 TABLET BY MOUTH AT BEDTIME AS NEEDED FOR ANXIETY, Disp: 90 tablet, Rfl: 0 .  amLODipine (NORVASC) 10 MG tablet, Take 1 tablet (10 mg total) by mouth daily., Disp: 90 tablet, Rfl: 3 .  aspirin 81 MG tablet, Take 81 mg by mouth daily., Disp: , Rfl:  .  HYDROcodone-acetaminophen (NORCO) 5-325 MG tablet, Take 1 tablet by mouth every 6 (six) hours as needed for moderate pain., Disp: 30 tablet, Rfl: 0 .  lisinopril (PRINIVIL,ZESTRIL) 40 MG tablet, Take 1 tablet (40 mg total) by mouth daily., Disp: 90 tablet, Rfl: 3 .  nitroGLYCERIN (NITROSTAT) 0.4 MG SL tablet, Place 1 tablet (0.4 mg total) under the tongue every 5 (five) minutes as needed for chest pain., Disp: 90 tablet, Rfl: 3 .  pravastatin (PRAVACHOL) 80 MG tablet, Take 1 tablet (80 mg total) by mouth daily., Disp: 90 tablet, Rfl: 3 .  QUEtiapine (SEROQUEL) 25 MG tablet, TAKE 1 TABLET BY MOUTH EVERYDAY AT BEDTIME, Disp: 90 tablet, Rfl: 0  No Known Allergies   Review of Systems   Pertinent items are noted in the HPI. Otherwise, a complete ROS is negative.  Vitals   Vitals:   04/07/18 0951  BP: 138/64  Pulse: 66  SpO2: 97%  Weight: 173 lb 6.4 oz (78.7 kg)  Height: 5\' 10"  (1.778 m)  Body mass index is 24.88 kg/m.  Physical Exam   Physical Exam Vitals signs and nursing note reviewed.  Constitutional:      General: He is not in acute distress.    Appearance: He is well-developed.  HENT:     Head: Normocephalic and atraumatic.     Right Ear: External ear normal.     Left Ear: External ear normal.     Nose: Nose normal.  Eyes:     Conjunctiva/sclera: Conjunctivae normal.     Pupils: Pupils are equal, round, and reactive to light.  Neck:     Musculoskeletal: Neck supple.  Cardiovascular:     Rate and Rhythm: Normal rate and regular rhythm.  Pulmonary:     Effort: Pulmonary effort is normal.  Abdominal:     General: Bowel sounds are normal.     Palpations: Abdomen is soft.  Musculoskeletal: Normal  range of motion.  Skin:    General: Skin is warm.  Neurological:     Mental Status: He is alert.  Psychiatric:        Behavior: Behavior normal.     Results for orders placed or performed in visit on 04/07/18  CBC with Differential/Platelet  Result Value Ref Range   WBC 7.2 4.0 - 10.5 K/uL   RBC 4.45 4.22 - 5.81 Mil/uL   Hemoglobin 14.4 13.0 - 17.0 g/dL   HCT 42.0 39.0 - 52.0 %   MCV 94.4 78.0 - 100.0 fl   MCHC 34.3 30.0 - 36.0 g/dL   RDW 14.3 11.5 - 15.5 %   Platelets 293.0 150.0 - 400.0 K/uL   Neutrophils Relative % 70.1 43.0 - 77.0 %   Lymphocytes Relative 20.0 12.0 - 46.0 %   Monocytes Relative 7.3 3.0 - 12.0 %   Eosinophils Relative 1.9 0.0 - 5.0 %   Basophils Relative 0.7 0.0 - 3.0 %   Neutro Abs 5.1 1.4 - 7.7 K/uL   Lymphs Abs 1.4 0.7 - 4.0 K/uL   Monocytes Absolute 0.5 0.1 - 1.0 K/uL   Eosinophils Absolute 0.1 0.0 - 0.7 K/uL   Basophils Absolute 0.0 0.0 - 0.1 K/uL  Comprehensive metabolic panel  Result Value Ref Range   Sodium 134 (L) 135 - 145 mEq/L   Potassium 4.3 3.5 - 5.1 mEq/L   Chloride 99 96 - 112 mEq/L   CO2 27 19 - 32 mEq/L   Glucose, Bld 177 (H) 70 - 99 mg/dL   BUN 23 6 - 23 mg/dL   Creatinine, Ser 1.51 (H) 0.40 - 1.50 mg/dL   Total Bilirubin 0.4 0.2 - 1.2 mg/dL   Alkaline Phosphatase 49 39 - 117 U/L   AST 16 0 - 37 U/L   ALT 10 0 - 53 U/L   Total Protein 6.9 6.0 - 8.3 g/dL   Albumin 4.1 3.5 - 5.2 g/dL   Calcium 9.5 8.4 - 10.5 mg/dL   GFR 44.49 (L) >60.00 mL/min  Lipid panel  Result Value Ref Range   Cholesterol 170 0 - 200 mg/dL   Triglycerides 53.0 0.0 - 149.0 mg/dL   HDL 67.50 >39.00 mg/dL   VLDL 10.6 0.0 - 40.0 mg/dL   LDL Cholesterol 92 0 - 99 mg/dL   Total CHOL/HDL Ratio 3    NonHDL 102.54     Assessment and Plan   [name] [name] was seen today for follow-up, sleep disturbance, chronic kidney disease, hypertension, anxiety, hyperlipidemia and fall.  Cardiovascular ROS - taking medications as instructed, no medication side effects noted, no  TIA's, no chest pain on exertion, no dyspnea on exertion and no swelling of ankles, CNS ROS - no TIA or stroke-like symptoms. New concerns: improving, feeling better than he has in years..   Diagnoses and all orders for this visit:  Closed fracture of multiple ribs of left side with routine healing, subsequent encounter Comments: Improving. Orders: -     HYDROcodone-acetaminophen (NORCO) 5-325 MG tablet; Take 1 tablet by mouth every 6 (six) hours as needed for moderate pain.  Anxiety Comments: Controlled. Continue current treatment.  Orders: -     ALPRAZolam (XANAX) 0.5 MG tablet; Take 1 tablet (0.5 mg total) by mouth at bedtime as needed for anxiety.  Essential hypertension Comments: Controlled. Continue current treatment.  Orders: -     Discontinue: ALPRAZolam (XANAX) 0.5 MG tablet; Take 1 tablet (0.5 mg total) by mouth at bedtime as needed for anxiety. -     amLODipine (NORVASC) 10 MG tablet; Take 1 tablet (10 mg total) by mouth daily. -     Discontinue: HYDROcodone-acetaminophen (NORCO) 5-325 MG tablet; Take 1 tablet by mouth every 6 (six) hours as needed for moderate pain. -     lisinopril (PRINIVIL,ZESTRIL) 40 MG tablet; Take 1 tablet (40 mg total) by mouth daily.  Pure hyperglyceridemia Comments: Controlled. Continue current treatment.  Orders: -     Discontinue: ALPRAZolam (XANAX) 0.5 MG tablet; Take 1 tablet (0.5 mg total) by mouth at bedtime as needed for anxiety. -     Discontinue: HYDROcodone-acetaminophen (NORCO) 5-325 MG tablet; Take 1 tablet by mouth every 6 (six) hours as needed for moderate pain. -     pravastatin (PRAVACHOL) 80 MG tablet; Take 1 tablet (80 mg total) by mouth daily. -     Lipid panel  CKD (chronic kidney disease) stage 3, GFR 30-59 ml/min ([facility]) -     CBC with Differential/Platelet -     Comprehensive metabolic panel  Disturbance in sleep behavior -     QUEtiapine (SEROQUEL) 25 MG tablet; Take 1 tablet (25 mg total) by mouth at bedtime.  Other  orders -     Discontinue: QUEtiapine (SEROQUEL) 25 MG tablet; Take 1 tablet (25 mg total) by mouth at bedtime.    . Orders and follow up as documented in [place], reviewed diet, exercise and weight control, cardiovascular risk and specific lipid/LDL goals reviewed, reviewed medications and side effects in detail.  . Reviewed expectations re: course of current medical issues. . Outlined signs and symptoms indicating need for more acute intervention. . Patient verbalized understanding and all questions were answered. . Patient received an After Visit Summary.  Briscoe Deutscher, DO [place] 04/08/2018

## 2018-04-08 ENCOUNTER — Encounter: Payer: Self-pay | Admitting: Family Medicine

## 2018-04-08 DIAGNOSIS — E781 Pure hyperglyceridemia: Secondary | ICD-10-CM | POA: Insufficient documentation

## 2018-06-12 ENCOUNTER — Telehealth: Payer: Self-pay | Admitting: Cardiology

## 2018-06-12 NOTE — Telephone Encounter (Signed)
LVMTCB to schedule from appt from recall list with Dr. Percival Spanish.

## 2018-08-31 ENCOUNTER — Telehealth: Payer: Self-pay | Admitting: Cardiology

## 2018-08-31 NOTE — Telephone Encounter (Signed)
New message:    Patient daughter calling stating that she needs to come with for his appt and they would like to get in on that Friday. Please call patient daughter.

## 2018-08-31 NOTE — Telephone Encounter (Signed)
SPOKE WITH  DAUGHTER -  She needs to be with patient  - hard of hearing and she is care giver    REQUEST IF APPOINTMENT CAN BE MOVED TO LATER IN THE DAY .   INFORMATION DISCUSSED WITH DR [facility] NURSE    RN   WENT OVER  COVID  QUESTION AND INSTRUCTION .SHE MAY CONE TO OFFICE APPOINTMENT WITH  PATIENT        COVID-19 Pre-Screening Questions:  . In the past 7 to 10 days have you had a cough,  shortness of breath, headache, congestion, fever (100 or greater) body aches, chills, sore throat, or sudden loss of taste or sense of smell?NO . Have you been around anyone with known Covid 19.NO . Have you been around anyone who is awaiting Covid 19 test results in the past 7 to 10 days?NO . Have you been around anyone who has been exposed to Covid 19, or has mentioned symptoms of Covid 19 within the past 7 to 10 days?NO      Primary Cardiologist: DR Lynelle Smoke contacted.  History and symptoms reviewed.  Pt will f[facility]u with [facility] provider as scheduled.  Pt. advised that we are restricting visitors at this time and request that only patients present for check-in prior to their appointment.  All other visitors should remain in their car.  If necessary, only one visitor may come with the patient, into the building. For everyone's safety, all patients and visitor entering our practice area should expect to be screened again prior to entering our waiting area. AWARE TO WEAR FACE COVERING OR MASK FOR BOTH .  Raiford Simmonds, RN  08/31/2018 10:24 AM

## 2018-09-08 NOTE — Progress Notes (Signed)
HPI The patient presents for evaluation of chest pain and CAD.  I saw him previously for high blood pressure.  He is done well.  He heats with wood.  He has to help get this in.  The patient denies any new symptoms such as chest discomfort, neck or arm discomfort. There has been no new shortness of breath, PND or orthopnea. There have been no reported palpitations, presyncope or syncope.    No Known Allergies  Current Outpatient Medications  Medication Sig Dispense Refill  . ALPRAZolam (XANAX) 0.5 MG tablet Take 1 tablet (0.5 mg total) by mouth at bedtime as needed for anxiety. 90 tablet 0  . amLODipine (NORVASC) 10 MG tablet Take 1 tablet (10 mg total) by mouth daily. 90 tablet 3  . aspirin 81 MG tablet Take 81 mg by mouth daily.    Marland Kitchen lisinopril (PRINIVIL,ZESTRIL) 40 MG tablet Take 1 tablet (40 mg total) by mouth daily. 90 tablet 3  . nitroGLYCERIN (NITROSTAT) 0.4 MG SL tablet Place 1 tablet (0.4 mg total) under the tongue every 5 (five) minutes as needed for chest pain. 90 tablet 3  . pravastatin (PRAVACHOL) 80 MG tablet Take 1 tablet (80 mg total) by mouth daily. 90 tablet 3  . QUEtiapine (SEROQUEL) 25 MG tablet Take 1 tablet (25 mg total) by mouth at bedtime. 90 tablet 3   No current facility-administered medications for this visit.     Past Medical History:  Diagnosis Date  . Anxiety 10/10/2016  . Aortic atherosclerosis ([place]) 05/05/2017  . Cancer of left lung parenchyma ([place]) 04/23/2008  . Chronic hyponatremia 05/05/2017  . CKD (chronic kidney disease) stage 3, GFR 30-59 ml/min (HCC) 10/10/2016  . 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 Coronary atherosclerosis 03/13/2006  . Essential hypertension 03/18/2006  . HLD (hyperlipidemia) 04/23/2008  . Squamous cell lung cancer ([place]) 2006    Past Surgical History:  Procedure Laterality Date  . CATARACT EXTRACTION, BILATERAL  2013  . CORONARY STENT PLACEMENT    . PNEUMONECTOMY      ROS:    2 out of 6 stated in the HPI and  negative for all other systems.  PHYSICAL EXAM BP (!) 176/77   Pulse 69   Ht 5\' 7"  (1.702 m)   Wt 170 lb 6.4 oz (77.3 kg)   SpO2 95%   BMI 26.69 kg/m   GENERAL:  Well appearing NECK:  No jugular venous distention, waveform within normal limits, carotid upstroke brisk and symmetric, no bruits, no thyromegaly LUNGS:  Clear to auscultation bilaterally CHEST:  Unremarkable HEART:  PMI not displaced or sustained,S1 and S2 within normal limits, no S3, no S4, no clicks, no rubs, 2/6 apical systolic murmur radiating slightly at the aortic outflow tract, no diastolicrmurs ABD:  Flat, positive bowel sounds normal in frequency in pitch, no bruits, no rebound, no guarding, no midline pulsatile mass, no hepatomegaly, no splenomegaly EXT:  2 plus pulses throughout, no edema, no cyanosis no clubbing    Lab Results  Component Value Date   CHOL 170 04/07/2018   TRIG 53.0 04/07/2018   HDL 67.50 04/07/2018   LDLCALC 92 04/07/2018   LDLDIRECT 78.8 09/10/2011   EKG: Sinus rhythm, rate 69, axis within normal limits, intervals within normal limits, premature atrial contractions.  ASSESSMENT AND PLAN  HTN:  The blood pressure is elevated.  He is to keep a 2-week blood pressure diary and then I will have a phone visit with him and probably will not  need to add chlorthalidone.   CAD:   The patient has no new sypmtoms.  No further cardiovascular testing is indicated.  We will continue with aggressive risk reduction and meds as listed.  DYSLIPIDEMIA:     He is not quite at target but he has been somewhat sensitive to medication given his advanced age and not Raoul Pitch try to switch medicines at this point.

## 2018-09-09 ENCOUNTER — Telehealth: Payer: Self-pay | Admitting: Cardiology

## 2018-09-09 ENCOUNTER — Encounter: Payer: Self-pay | Admitting: Cardiology

## 2018-09-09 ENCOUNTER — Other Ambulatory Visit: Payer: Self-pay

## 2018-09-09 ENCOUNTER — Ambulatory Visit (INDEPENDENT_AMBULATORY_CARE_PROVIDER_SITE_OTHER): Payer: Medicare Other | Admitting: Cardiology

## 2018-09-09 VITALS — BP 176/77 | HR 69 | Ht 67.0 in | Wt 170.4 lb

## 2018-09-09 DIAGNOSIS — E785 Hyperlipidemia, unspecified: Secondary | ICD-10-CM

## 2018-09-09 DIAGNOSIS — I10 Essential (primary) hypertension: Secondary | ICD-10-CM

## 2018-09-09 DIAGNOSIS — I2510 Atherosclerotic heart disease of native coronary artery without angina pectoris: Secondary | ICD-10-CM | POA: Diagnosis not present

## 2018-09-09 MED ORDER — NITROGLYCERIN 0.4 MG SL SUBL: 0.4000 mg | SUBLINGUAL_TABLET | SUBLINGUAL | 3 refills | Status: DC | PRN

## 2018-09-09 NOTE — Patient Instructions (Addendum)
Follow-Up:      IN 2 WEEKS WITH DR [facility] ON THE TELEPHONE 09-30-2018 @10AM   Medication Instructions:  The current medical regimen is effective;  continue present plan and medications as directed. Please refer to the Current Medication list given to you today. If you need a refill on your cardiac medications before your next appointment, please call your pharmacy. Labwork: When you have labs (blood work) and your tests are completely normal, you will receive your results ONLY by [place] (if you have MyChart) -OR- A paper copy in the mail.  At [facility], you and your health needs are our priority.  As part of our continuing mission to provide you with exceptional heart care, we have created designated Provider Care Teams.  These Care Teams include your primary Cardiologist (physician) and Advanced Practice Providers (APPs -  Physician Assistants and Nurse Practitioners) who all work together to provide you with the care you need, when you need it.  Thank you for choosing [facility] at [facility]!!

## 2018-09-09 NOTE — Telephone Encounter (Signed)
I called pt to LVM for pt to call back to be pre-screened for COVID-19 today.

## 2018-09-15 ENCOUNTER — Encounter: Payer: Self-pay | Admitting: Cardiology

## 2018-09-15 DIAGNOSIS — I2510 Atherosclerotic heart disease of native coronary artery without angina pectoris: Secondary | ICD-10-CM | POA: Insufficient documentation

## 2018-09-15 DIAGNOSIS — E785 Hyperlipidemia, unspecified: Secondary | ICD-10-CM | POA: Insufficient documentation

## 2018-09-29 NOTE — Progress Notes (Signed)
Virtual Visit via Telephone Note   This visit type was conducted due to national recommendations for restrictions regarding the COVID-19 Pandemic (e.g. social distancing) in an effort to limit this patient's exposure and mitigate transmission in our community.  Due to his co-morbid illnesses, this patient is at least at moderate risk for complications without adequate follow up.  This format is felt to be most appropriate for this patient at this time.  The patient did not have access to video technology/had technical difficulties with video requiring transitioning to audio format only (telephone).  All issues noted in this document were discussed and addressed.  No physical exam could be performed with this format.  Please refer to the patient's chart for his  consent to telehealth for [facility].   Date:  09/30/2018   ID:  [name] [name], DOB May 16, 1934, MRN [id number]  Patient Location: Home Provider Location: Home  PCP:  Briscoe Deutscher, DO  Cardiologist:  Minus Breeding, MD  Electrophysiologist:  None   Evaluation Performed:  Follow-Up Visit  Chief Complaint:  CAD  History of Present Illness:    [name] [name] is a 84 y.o. male with HTN and CAD.   I saw him recently for high blood pressure.  I asked him to keep a blood pressure diary for a couple of weeks.  He was hypervigilant about this.  He read me the entire list of twice a day readings.  He fluctuates from the 606T systolic to the 016 systolic with normal diastolics routinely.   The patient denies any new symptoms such as chest discomfort, neck or arm discomfort. There has been no new shortness of breath, PND or orthopnea. There have been no reported palpitations, presyncope or syncope.  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  The patient does not have symptoms concerning for COVID-19 infection (fever, chills, cough, or new shortness of breath).    Past Medical History:  Diagnosis Date  . Anxiety 10/10/2016   . Aortic atherosclerosis ([place]) 05/05/2017  . Cancer of left lung parenchyma ([place]) 04/23/2008  . Chronic hyponatremia 05/05/2017  . CKD (chronic kidney disease) stage 3, GFR 30-59 ml/min (HCC) 10/10/2016  . 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 Coronary atherosclerosis 03/13/2006  . Essential hypertension 03/18/2006  . HLD (hyperlipidemia) 04/23/2008  . Squamous cell lung cancer ([place]) 2006   Past Surgical History:  Procedure Laterality Date  . CATARACT EXTRACTION, BILATERAL  2013  . CORONARY STENT PLACEMENT    . PNEUMONECTOMY       Prior to Admission medications   Medication Sig Start Date End Date Taking? Authorizing Provider  ALPRAZolam Duanne Moron) 0.5 MG tablet Take 1 tablet (0.5 mg total) by mouth at bedtime as needed for anxiety. 04/07/18  Yes Briscoe Deutscher, DO  aspirin 81 MG tablet Take 81 mg by mouth daily.   Yes [provider]  nitroGLYCERIN (NITROSTAT) 0.4 MG SL tablet Place 1 tablet (0.4 mg total) under the tongue every 5 (five) minutes as needed for chest pain. 09/09/18 12/08/18 Yes Minus Breeding, MD  pravastatin (PRAVACHOL) 80 MG tablet Take 1 tablet (80 mg total) by mouth daily. 04/07/18  Yes Briscoe Deutscher, DO  QUEtiapine (SEROQUEL) 25 MG tablet Take 1 tablet (25 mg total) by mouth at bedtime. 04/07/18  Yes Briscoe Deutscher, DO  amLODipine (NORVASC) 10 MG tablet Take 1 tablet (10 mg total) by mouth daily. 04/07/18 09/09/18  Briscoe Deutscher, DO  lisinopril (PRINIVIL,ZESTRIL) 40 MG tablet Take 1  tablet (40 mg total) by mouth daily. 04/07/18 09/09/18  Briscoe Deutscher, DO    Allergies:   Patient has no known allergies.   Social History   Tobacco Use  . Smoking status: Former Smoker    Packs/day: 1.50    Years: 20.00    Pack years: 30.00    Types: Cigarettes    Quit date: 02/18/1996    Years since quitting: 22.6  . Smokeless tobacco: Never Used  Substance Use Topics  . Alcohol use: Yes    Alcohol/week: 14.0 standard drinks    Types: 14 Cans of beer per week    Comment:  more when on vacation  . Drug use: No     Family Hx: The patient's family history includes Heart attack (age of onset: 81) in his father; Heart attack (age of onset: 82) in his mother; Hyperlipidemia in his brother.  ROS:   Please see the history of present illness.    As stated in the HPI and negative for all other systems.   Prior CV studies:   The following studies were reviewed today:  None  Labs/Other Tests and Data Reviewed:    EKG:  No ECG reviewed.  Recent Labs: 04/07/2018: ALT 10; BUN 23; Creatinine, Ser 1.51; Hemoglobin 14.4; Platelets 293.0; Potassium 4.3; Sodium 134   Recent Lipid Panel Lab Results  Component Value Date/Time   CHOL 170 04/07/2018 10:19 AM   CHOL 204 (H) 08/25/2016 10:01 AM   TRIG 53.0 04/07/2018 10:19 AM   HDL 67.50 04/07/2018 10:19 AM   HDL 125 08/25/2016 10:01 AM   CHOLHDL 3 04/07/2018 10:19 AM   LDLCALC 92 04/07/2018 10:19 AM   LDLCALC 70 08/25/2016 10:01 AM   LDLDIRECT 78.8 09/10/2011 08:01 AM    Wt Readings from Last 3 Encounters:  09/30/18 170 lb (77.1 kg)  09/09/18 170 lb 6.4 oz (77.3 kg)  04/07/18 173 lb 6.4 oz (78.7 kg)     Objective:    Vital Signs:  BP 124/64   Ht 5\' 8"  (1.727 m)   Wt 170 lb (77.1 kg)   BMI 25.85 kg/m    VITAL SIGNS:  reviewed  ASSESSMENT & PLAN:    HTN:   His blood pressure fluctuates a little bit but I think it is well controlled in general I will make no changes to his medications.  I called and spoke with his daughter [name] about this as well.  CAD:   The patient has no new sypmtoms.  No further cardiovascular testing is indicated.  We will continue with aggressive risk reduction and meds as listed.  DYSLIPIDEMIA:    He has been sensitive to meds and I will not titrate.  I will defer to Briscoe Deutscher, DO   COVID-19 Education: The signs and symptoms of COVID-19 were discussed with the patient and how to seek care for testing (follow up with PCP or arrange E-visit).  The importance of social  distancing was discussed today.  Time:   Today, I have spent 16 minutes with the patient with telehealth technology discussing the above problems.     Medication Adjustments/Labs and Tests Ordered: Current medicines are reviewed at length with the patient today.  Concerns regarding medicines are outlined above.   Tests Ordered: No orders of the defined types were placed in this encounter.   Medication Changes: No orders of the defined types were placed in this encounter.   Follow Up:  In Person in one year.    Signed, Minus Breeding,  MD  09/30/2018 11:12 AM    [place]

## 2018-09-30 ENCOUNTER — Encounter: Payer: Self-pay | Admitting: Cardiology

## 2018-09-30 ENCOUNTER — Telehealth (INDEPENDENT_AMBULATORY_CARE_PROVIDER_SITE_OTHER): Payer: Medicare Other | Admitting: Cardiology

## 2018-09-30 VITALS — BP 124/64 | Ht 68.0 in | Wt 170.0 lb

## 2018-09-30 DIAGNOSIS — I2510 Atherosclerotic heart disease of native coronary artery without angina pectoris: Secondary | ICD-10-CM | POA: Diagnosis not present

## 2018-09-30 DIAGNOSIS — I10 Essential (primary) hypertension: Secondary | ICD-10-CM

## 2018-09-30 DIAGNOSIS — E785 Hyperlipidemia, unspecified: Secondary | ICD-10-CM

## 2018-09-30 DIAGNOSIS — I119 Hypertensive heart disease without heart failure: Secondary | ICD-10-CM

## 2018-09-30 NOTE — Patient Instructions (Signed)

## 2018-10-18 ENCOUNTER — Telehealth: Payer: Self-pay | Admitting: Adult Health

## 2018-10-18 NOTE — Telephone Encounter (Signed)
Returned call re reschedule 9/1 f/u. Confirmed new appointment for 12/4. Per dtr patient wants to push f/u further out due to covid-19.

## 2018-10-19 ENCOUNTER — Inpatient Hospital Stay: Payer: Medicare Other | Admitting: Adult Health

## 2018-11-01 DIAGNOSIS — Z23 Encounter for immunization: Secondary | ICD-10-CM | POA: Diagnosis not present

## 2018-11-03 ENCOUNTER — Telehealth: Payer: Self-pay | Admitting: Family Medicine

## 2018-11-03 NOTE — Telephone Encounter (Signed)
I left a message asking the patient to call me at [phone] to schedule AWV with Courtney. VDM (Dee-Dee) °

## 2018-11-23 ENCOUNTER — Other Ambulatory Visit: Payer: Self-pay | Admitting: Family Medicine

## 2018-11-23 DIAGNOSIS — G479 Sleep disorder, unspecified: Secondary | ICD-10-CM

## 2018-12-14 ENCOUNTER — Ambulatory Visit (INDEPENDENT_AMBULATORY_CARE_PROVIDER_SITE_OTHER): Payer: Medicare Other | Admitting: Family Medicine

## 2018-12-14 ENCOUNTER — Encounter: Payer: Self-pay | Admitting: Family Medicine

## 2018-12-14 ENCOUNTER — Other Ambulatory Visit: Payer: Self-pay

## 2018-12-14 VITALS — BP 148/62 | HR 62 | Temp 98.0°F | Ht 68.0 in | Wt 168.2 lb

## 2018-12-14 DIAGNOSIS — G8929 Other chronic pain: Secondary | ICD-10-CM | POA: Diagnosis not present

## 2018-12-14 DIAGNOSIS — M25561 Pain in right knee: Secondary | ICD-10-CM | POA: Diagnosis not present

## 2018-12-14 DIAGNOSIS — G4700 Insomnia, unspecified: Secondary | ICD-10-CM

## 2018-12-14 MED ORDER — METHYLPREDNISOLONE ACETATE 80 MG/ML IJ SUSP
80.0000 mg | Freq: Once | INTRAMUSCULAR | Status: AC
  Administered 2018-12-14: 10:00:00 80 mg via INTRAMUSCULAR

## 2018-12-14 NOTE — Patient Instructions (Signed)
It was very nice to see you today!  We gave you a cortisone shot in your knee today.  Please use ice and compression to the area as much as you can.  You can use over-the-counter Voltaren gel as needed.  Please increase your Seroquel to 50 mg nightly.  Take care, Dr Jerline Pain  Please try these tips to maintain a healthy lifestyle:   Eat at least 3 REAL meals and 1-2 snacks per day.  Aim for no more than 5 hours between eating.  If you eat breakfast, please do so within one hour of getting up.    Obtain twice as many fruits/vegetables as protein or carbohydrate foods for both lunch and dinner. (Half of each meal should be fruits/vegetables, one quarter protein, and one quarter starchy carbs)   Cut down on sweet beverages. This includes juice, soda, and sweet tea.    Exercise at least 150 minutes every week.

## 2018-12-14 NOTE — Addendum Note (Signed)
Addended by: Loralyn Freshwater on: 12/14/2018 10:26 AM   Modules accepted: Orders

## 2018-12-14 NOTE — Progress Notes (Signed)
   Chief Complaint:  [name] [name] is a 84 y.o. male who presents today with a chief complaint of knee pain.   Assessment/Plan:  Right knee pain Osteoarthritis flare.  Intra-articular injection given today.  See below procedure note.  Recommended ice and compression.  Also recommended over-the-counter Voltaren gel.  Insomnia Recommended he increase Seroquel to 50 mg daily.     Subjective:  HPI:  Knee Pain  Started about a year ago. Located in right knee. Some swelling. Hurts with movement. Tried OTC creams which have not helped. No popping, locking, or catching. No weakness or numbness. No other obvious aggravating or alleviating factors.   He is also having some difficulty with sleeping. Currently on xanax 0.5mg  as needed and seroquel 25mg  nightly. Sleeps for a few hours then wakes up and has trouble going   ROS: Per HPI  PMH: He reports that he quit smoking about 22 years ago. His smoking use included cigarettes. He has a 30.00 pack-year smoking history. He has never used smokeless tobacco. He reports current alcohol use of about 14.0 standard drinks of alcohol per week. He reports that he does not use drugs.      Objective:  Physical Exam: BP (!) 148/62   Pulse 62   Temp 98 F (36.7 C)   Ht 5\' 8"  (1.727 m)   Wt 168 lb 3.2 oz (76.3 kg)   SpO2 97%   BMI 25.57 kg/m   Gen: NAD, resting comfortably MSK: -Right knee: No deformities.  Full range of motion.  Crepitus with active range of motion.  Tender to palpation along bilateral joint line.  Stable to varus and valgus stress.  Anterior and posterior drawer signs negative.  Knee Injection Procedure Note  Indication: Symptom relief of right Knee Pain.  Procedure Details  Verbal consent was obtained for the procedure. The joint was prepped with Betadine. Topical ethyl chloride was applied for anesthesia. A 22 gauge needle was inserted into the medial aspect of the joint.  3 ml 1% lidocaine and 1 ml of 40mg /cc Depo-Medrol was  then injected into the joint. The needle was removed and the area cleansed and dressed.  Complications:  None; patient tolerated the procedure well.        Algis Greenhouse. Jerline Pain, MD 12/14/2018 10:13 AM

## 2018-12-28 ENCOUNTER — Telehealth: Payer: Self-pay | Admitting: Family Medicine

## 2018-12-28 NOTE — Telephone Encounter (Signed)
QUEtiapine (SEROQUEL) 50 MG tablet    Patient requesting 1 mo supply of this medication sent into pharmacy. Refills available at pharmacy are 25 mg, rather than the 50 mg he was advised to take at last visit.    Pharmacy:  [id number] [place] [address], [place] [phone] (Phone) [phone] (Fax)

## 2019-01-18 ENCOUNTER — Telehealth: Payer: Self-pay | Admitting: Adult Health

## 2019-01-18 ENCOUNTER — Other Ambulatory Visit: Payer: Self-pay

## 2019-01-18 DIAGNOSIS — G479 Sleep disorder, unspecified: Secondary | ICD-10-CM

## 2019-01-18 MED ORDER — QUETIAPINE FUMARATE 50 MG PO TABS: 50.0000 mg | ORAL_TABLET | Freq: Every day | ORAL | 0 refills | Status: DC

## 2019-01-18 NOTE — Telephone Encounter (Signed)
Med change was made by Dr. Jerline Pain. Is he the new PCP?

## 2019-01-18 NOTE — Telephone Encounter (Signed)
Returned patient's phone call regarding cancelling 12/01 appointment, per patient's request appointment cancelled. Left a voicemail.

## 2019-01-18 NOTE — Telephone Encounter (Signed)
See note, unsure what happened with the note 3 weeks ago and getting routed.

## 2019-01-18 NOTE — Telephone Encounter (Signed)
Patient's daughter returning call about this request. Requesting call back from [place].

## 2019-01-18 NOTE — Telephone Encounter (Signed)
Sent 50 mg Seroquel to pharmacy.

## 2019-01-21 ENCOUNTER — Encounter: Payer: Medicare Other | Admitting: Adult Health

## 2019-01-31 ENCOUNTER — Telehealth: Payer: Self-pay | Admitting: Family Medicine

## 2019-01-31 NOTE — Telephone Encounter (Signed)
Please advise 

## 2019-01-31 NOTE — Telephone Encounter (Signed)
Copied from [place] [phone]. Topic: Quick Communication - See Telephone Encounter >> Jan 31, 2019 12:10 PM Loma Boston wrote: CRM for notification. See Telephone encounter for: 01/31/19. Pt wants to be called on wife's phone at [phone] re appt to be made today  Patient needs in office appt for Xanex refill, please advise if patient can be worked in today or tomorrow with Dr. Jerline Pain and then [facility] appt later., Thank you!

## 2019-02-01 NOTE — Telephone Encounter (Signed)
LVM FOR PATIENT TO CALL BACK AND SCHEDULE A VV OR IN OFFICE APPT PER DR. PARKER

## 2019-02-01 NOTE — Telephone Encounter (Signed)
Please schedule appt

## 2019-02-01 NOTE — Telephone Encounter (Signed)
Please schedule OV (can be virtual) ASAP.  Algis Greenhouse. Jerline Pain, MD 02/01/2019 8:04 AM

## 2019-02-04 ENCOUNTER — Ambulatory Visit (INDEPENDENT_AMBULATORY_CARE_PROVIDER_SITE_OTHER): Payer: Medicare Other | Admitting: Family Medicine

## 2019-02-04 ENCOUNTER — Encounter: Payer: Self-pay | Admitting: Family Medicine

## 2019-02-04 DIAGNOSIS — E781 Pure hyperglyceridemia: Secondary | ICD-10-CM | POA: Diagnosis not present

## 2019-02-04 DIAGNOSIS — F419 Anxiety disorder, unspecified: Secondary | ICD-10-CM

## 2019-02-04 DIAGNOSIS — I10 Essential (primary) hypertension: Secondary | ICD-10-CM | POA: Diagnosis not present

## 2019-02-04 DIAGNOSIS — G479 Sleep disorder, unspecified: Secondary | ICD-10-CM

## 2019-02-04 MED ORDER — QUETIAPINE FUMARATE 50 MG PO TABS: 50.0000 mg | ORAL_TABLET | Freq: Every day | ORAL | 0 refills | Status: DC

## 2019-02-04 MED ORDER — AMLODIPINE BESYLATE 10 MG PO TABS: 10.0000 mg | ORAL_TABLET | Freq: Every day | ORAL | 3 refills | Status: DC

## 2019-02-04 MED ORDER — LISINOPRIL 40 MG PO TABS: 40.0000 mg | ORAL_TABLET | Freq: Every day | ORAL | 3 refills | Status: DC

## 2019-02-04 MED ORDER — PRAVASTATIN SODIUM 80 MG PO TABS: 80.0000 mg | ORAL_TABLET | Freq: Every day | ORAL | 3 refills | Status: DC

## 2019-02-04 MED ORDER — ALPRAZOLAM 0.5 MG PO TABS: 0.5000 mg | ORAL_TABLET | Freq: Every evening | ORAL | 3 refills | Status: DC | PRN

## 2019-02-04 NOTE — Assessment & Plan Note (Signed)
Stable.  Continue Xanax as needed.

## 2019-02-04 NOTE — Assessment & Plan Note (Signed)
Stable.  Continue pravastatin 80 mg daily.  Check lipids next blood draw.

## 2019-02-04 NOTE — Assessment & Plan Note (Signed)
Doing well.  Continue lisinopril 40 mg daily and amlodipine 10 mg daily.

## 2019-02-04 NOTE — Assessment & Plan Note (Signed)
Stable.  Continue Xanax and Seroquel.

## 2019-02-04 NOTE — Progress Notes (Signed)
    Chief Complaint:  [name] [name] is a 84 y.o. male who presents for a telephone visit with a chief complaint of insomnia.   Assessment/Plan:  Disturbance in sleep behavior Stable.  Continue Xanax and Seroquel.  Essential hypertension Doing well.  Continue lisinopril 40 mg daily and amlodipine 10 mg daily.  Anxiety Stable.  Continue Xanax as needed.  HLD (hyperlipidemia) Stable.  Continue pravastatin 80 mg daily.  Check lipids next blood draw.    Subjective:  HPI:  His stable, chronic medical conditions are outlined below:  His stable, chronic medical conditions are outlined below:  # Anxiety / insomnia - On xanax 0.5mg  nightly as needed and seroquel 50mg  at bed time.  Tolerating well without side effects  #Essential hypertension - On lisinopril 40 mg daily and amlodipine 10 mg daily.  Tolerating well. - ROS: No reported chest pain or shortness of breath  # Dyslipidemia -On pravastatin 80 mg daily and tolerating well.    ROS: Per HPI  PMH: He reports that he quit smoking about 22 years ago. His smoking use included cigarettes. He has a 30.00 pack-year smoking history. He has never used smokeless tobacco. He reports current alcohol use of about 14.0 standard drinks of alcohol per week. He reports that he does not use drugs.      Objective/Observations   No results found for this or any previous visit (from the past 72 hour(s)).   Telephone Visit   I connected with [name] [name] on 02/04/19 at 11:40 AM EST via telephone and verified that I am speaking with the correct person using two identifiers. I discussed the limitations of evaluation and management by telemedicine and the availability of in person appointments. The patient expressed understanding and agreed to proceed.   Patient location: Home Provider location: [place] participating in the virtual visit: Myself and Patient  A total of 11 minutes were spent on medical  discussion.      Algis Greenhouse. Jerline Pain, MD 02/04/2019 12:11 PM

## 2019-02-15 ENCOUNTER — Other Ambulatory Visit: Payer: Self-pay

## 2019-02-15 ENCOUNTER — Other Ambulatory Visit: Payer: Self-pay | Admitting: Family Medicine

## 2019-02-15 DIAGNOSIS — G479 Sleep disorder, unspecified: Secondary | ICD-10-CM

## 2019-02-15 DIAGNOSIS — F419 Anxiety disorder, unspecified: Secondary | ICD-10-CM

## 2019-02-15 MED ORDER — ALPRAZOLAM 0.5 MG PO TABS: 0.5000 mg | ORAL_TABLET | Freq: Every evening | ORAL | 3 refills | Status: DC | PRN

## 2019-02-15 MED ORDER — QUETIAPINE FUMARATE 50 MG PO TABS: 50.0000 mg | ORAL_TABLET | Freq: Every day | ORAL | 0 refills | Status: DC

## 2019-02-15 NOTE — Telephone Encounter (Signed)
Please advise.  Patient states mail order pharmacy was too expensive and prescription was denied.

## 2019-02-15 NOTE — Telephone Encounter (Signed)
QUEtiapine (SEROQUEL) 50 MG tablet ALPRAZolam (XANAX) 0.5 MG tablet     Patient requesting these medications be resent to local pharmacy, as mail order was too expensive.  Pharmacy   [id number] [place], [place] Phone:  [phone]  Fax:  [phone]

## 2019-03-07 ENCOUNTER — Telehealth: Payer: Self-pay | Admitting: Family Medicine

## 2019-03-07 NOTE — Telephone Encounter (Signed)
I called the patient to schedule AWV with Loma Sousa ([place]), but there was no answer and no option to leave a message. If patient calls back, please schedule Medicare Wellness Visit at next available opening. Last AWV 12/03/16/ VDM (Dee-Dee)

## 2019-03-30 ENCOUNTER — Other Ambulatory Visit: Payer: Self-pay | Admitting: Family Medicine

## 2019-03-30 DIAGNOSIS — G479 Sleep disorder, unspecified: Secondary | ICD-10-CM

## 2019-06-03 ENCOUNTER — Other Ambulatory Visit: Payer: Self-pay | Admitting: Family Medicine

## 2019-06-03 DIAGNOSIS — G479 Sleep disorder, unspecified: Secondary | ICD-10-CM

## 2019-06-03 NOTE — Telephone Encounter (Signed)
LAST APPOINTMENT DATE: 02/04/2019  NEXT APPOINTMENT DATE:@Visit  date not found  RX Seroquel 50mg  LAST REFILL:03/30/2019  QTY:90

## 2019-06-11 ENCOUNTER — Other Ambulatory Visit: Payer: Self-pay | Admitting: Family Medicine

## 2019-06-11 DIAGNOSIS — F419 Anxiety disorder, unspecified: Secondary | ICD-10-CM

## 2019-06-13 NOTE — Telephone Encounter (Signed)
Rx request 

## 2019-07-05 ENCOUNTER — Telehealth: Payer: Self-pay | Admitting: Cardiology

## 2019-07-05 NOTE — Telephone Encounter (Signed)
Called patient 07/05/19 to schedule follow up, left message

## 2019-07-13 ENCOUNTER — Encounter: Payer: Self-pay | Admitting: Family Medicine

## 2019-07-13 ENCOUNTER — Ambulatory Visit (INDEPENDENT_AMBULATORY_CARE_PROVIDER_SITE_OTHER): Payer: Medicare Other | Admitting: Family Medicine

## 2019-07-13 ENCOUNTER — Other Ambulatory Visit: Payer: Self-pay

## 2019-07-13 VITALS — BP 120/64 | HR 64 | Temp 98.1°F | Ht 68.0 in | Wt 165.4 lb

## 2019-07-13 DIAGNOSIS — G8929 Other chronic pain: Secondary | ICD-10-CM | POA: Diagnosis not present

## 2019-07-13 DIAGNOSIS — M25561 Pain in right knee: Secondary | ICD-10-CM

## 2019-07-13 MED ORDER — METHYLPREDNISOLONE ACETATE 80 MG/ML IJ SUSP
80.0000 mg | Freq: Once | INTRAMUSCULAR | Status: AC
  Administered 2019-07-13: 80 mg via INTRAMUSCULAR

## 2019-07-13 NOTE — Progress Notes (Signed)
   [name] [name] is a 85 y.o. male who presents today for an office visit.  Assessment/Plan:  Chronic Problems Addressed Today: Chronic pain of right knee Acute flare.  Injection performed today.  See below procedure note.  He tolerated well.  Recommended ice and compression.  Can repeat injection in 3 months if needed.     Subjective:  HPI:  Patient here with flareup of right knee pain.  Has osteoarthritis and chronic knee pain.  Has tried over-the-counter meds without significant improvement.  Has had steroid injections in the past which worked well.  Would like to have repeat injection performed today.       Objective:  Physical Exam: BP 120/64   Pulse 64   Temp 98.1 F (36.7 C) (Temporal)   Ht 5\' 8"  (1.727 m)   Wt 165 lb 6.4 oz (75 kg)   SpO2 98%   BMI 25.15 kg/m   Gen: No acute distress, resting comfortably MSK: Right knee without obvious effusion or deformity.  Limited range of motion.  Crepitus with passive and active range of motion. Neuro: Grossly normal, moves all extremities Psych: Normal affect and thought content  Knee Injection Procedure Note  Indication: Symptom relief of right Knee Pain.  Procedure Details  Verbal consent was obtained for the procedure. The joint was prepped with Betadine. Topical ethyl chloride was applied for anesthesia. A 22 gauge needle was inserted into the medial aspect of the joint.  3 ml 1% lidocaine and 1 ml of 80mg /cc Depo-Medrol was then injected into the joint. The needle was removed and the area cleansed and dressed.  Complications:  None; patient tolerated the procedure well.        Algis Greenhouse. Jerline Pain, MD 07/13/2019 10:55 AM

## 2019-07-13 NOTE — Patient Instructions (Addendum)
It was very nice to see you today!  We gave you an injection for your knee today.  Please use compression and ice to the area.  Please come back in 6 months or so for your annual check with blood work, or sooner if needed.  Take care, Dr Jerline Pain  Please try these tips to maintain a healthy lifestyle:   Eat at least 3 REAL meals and 1-2 snacks per day.  Aim for no more than 5 hours between eating.  If you eat breakfast, please do so within one hour of getting up.    Each meal should contain half fruits/vegetables, one quarter protein, and one quarter carbs (no bigger than a computer mouse)   Cut down on sweet beverages. This includes juice, soda, and sweet tea.     Drink at least 1 glass of water with each meal and aim for at least 8 glasses per day   Exercise at least 150 minutes every week.

## 2019-07-13 NOTE — Assessment & Plan Note (Signed)
Acute flare.  Injection performed today.  See below procedure note.  He tolerated well.  Recommended ice and compression.  Can repeat injection in 3 months if needed.

## 2019-08-01 ENCOUNTER — Other Ambulatory Visit: Payer: Self-pay | Admitting: Family Medicine

## 2019-08-01 DIAGNOSIS — G479 Sleep disorder, unspecified: Secondary | ICD-10-CM

## 2019-10-02 ENCOUNTER — Other Ambulatory Visit: Payer: Self-pay | Admitting: Family Medicine

## 2019-10-02 DIAGNOSIS — G479 Sleep disorder, unspecified: Secondary | ICD-10-CM

## 2019-10-06 DIAGNOSIS — Z7189 Other specified counseling: Secondary | ICD-10-CM | POA: Insufficient documentation

## 2019-10-06 NOTE — Progress Notes (Signed)
Cardiology Office Note   Date:  10/07/2019   ID:  [name] [name], [name] [name] [birth year], MRN [id number]  PCP:  Vivi Barrack, MD  Cardiologist:   Minus Breeding, MD   Chief Complaint  Patient presents with  . Coronary Artery Disease      History of Present Illness: [name] [name] is a 85 y.o. male who presents for follow up of HTN and CAD.   I have been seeing him for HTN.  Since I last saw him he has had no new cardiovascular complaints.  He has not been keeping a blood pressure diary as I would like.  He thinks is better controlled at home.  He has not been having any cardiovascular symptoms.  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  He does some activities with this. The patient denies any new symptoms such as chest discomfort, neck or arm discomfort. There has been no new shortness of breath, PND or orthopnea. There have been no reported palpitations, presyncope or syncope.     Past Medical History:  Diagnosis Date  . Anxiety 10/10/2016  . Aortic atherosclerosis ([place]) 05/05/2017  . Cancer of left lung parenchyma ([place]) 04/23/2008  . Chronic hyponatremia 05/05/2017  . CKD (chronic kidney disease) stage 3, GFR 30-59 ml/min 10/10/2016  . COPD with emphysema ([place]) 05/05/2017  . Coronary atherosclerosis 03/13/2006  . Essential hypertension 03/18/2006  . HLD (hyperlipidemia) 04/23/2008  . Squamous cell lung cancer ([place]) 2006    Past Surgical History:  Procedure Laterality Date  . CATARACT EXTRACTION, BILATERAL  2013  . CORONARY STENT PLACEMENT    . PNEUMONECTOMY       Current Outpatient Medications  Medication Sig Dispense Refill  . ALPRAZolam (XANAX) 0.5 MG tablet TAKE 1 TABLET BY MOUTH AT BEDTIME AS NEEDED FOR ANXIETY. 30 tablet 3  . amLODipine (NORVASC) 10 MG tablet Take 1 tablet (10 mg total) by mouth daily. 90 tablet 3  . aspirin 81 MG tablet Take 81 mg by mouth daily.    Marland Kitchen lisinopril (ZESTRIL) 40 MG tablet Take 1 tablet (40 mg total) by mouth daily. 90 tablet 3  .  nitroGLYCERIN (NITROSTAT) 0.4 MG SL tablet Place 1 tablet (0.4 mg total) under the tongue every 5 (five) minutes as needed for chest pain. 25 tablet 3  . pravastatin (PRAVACHOL) 80 MG tablet Take 1 tablet (80 mg total) by mouth daily. 90 tablet 3  . QUEtiapine (SEROQUEL) 50 MG tablet TAKE 1 TABLET BY MOUTH EVERYDAY AT BEDTIME 90 tablet 1   No current facility-administered medications for this visit.    Allergies:   Patient has no known allergies.   ROS:  Please see the history of present illness.   Otherwise, review of systems are positive for none.   All other systems are reviewed and negative.    PHYSICAL EXAM: VS:  BP (!) 152/60   Pulse 61   Ht 5\' 7"  (1.702 m)   Wt 163 lb 9.6 oz (74.2 kg)   SpO2 98%   BMI 25.62 kg/m  , BMI Body mass index is 25.62 kg/m. GENERAL:  Well appearing NECK:  No jugular venous distention, waveform within normal limits, carotid upstroke brisk and symmetric, left carotid bruits, no thyromegaly LUNGS:  Clear to auscultation bilaterally CHEST:  Unremarkable HEART:  PMI not displaced or sustained,S1 and S2 within normal limits, no S3, no S4, no clicks, no rubs, no murmurs ABD:  Flat, positive bowel sounds normal in frequency in pitch, no bruits,  no rebound, no guarding, no midline pulsatile mass, no hepatomegaly, no splenomegaly EXT:  2 plus pulses throughout, no edema, no cyanosis no clubbing    EKG:  EKG is ordered today. The ekg ordered today demonstrates sinus rhythm, rate 58, axis within normal limits, intervals within normal limits, no acute ST-T wave changes.   Recent Labs: No results found for requested labs within last 8760 hours.    Lipid Panel    Component Value Date/Time   CHOL 170 04/07/2018 1019   CHOL 204 (H) 08/25/2016 1001   TRIG 53.0 04/07/2018 1019   HDL 67.50 04/07/2018 1019   HDL 125 08/25/2016 1001   CHOLHDL 3 04/07/2018 1019   VLDL 10.6 04/07/2018 1019   LDLCALC 92 04/07/2018 1019   LDLCALC 70 08/25/2016 1001    LDLDIRECT 78.8 09/10/2011 0801      Wt Readings from Last 3 Encounters:  10/07/19 163 lb 9.6 oz (74.2 kg)  07/13/19 165 lb 6.4 oz (75 kg)  12/14/18 168 lb 3.2 oz (76.3 kg)      Other studies Reviewed: Additional studies/ records that were reviewed today include: None. Review of the above records demonstrates:  Please see elsewhere in the note.     ASSESSMENT AND PLAN:  HTN: His blood pressure is elevated.  At home it is controlled and I have asked him to keep a diary of this and discussed this with his daughter as well.  He may need med adjustment.  CAD:   He has no new symptoms.  We will continue with risk reduction.  DYSLIPIDEMIA: I will check a lipid profile today.  He has been fasting.  Goal LDL will be less than 70 and he would be receptive to changing his medicines as needed.    BRUIT: I have not appreciated this before.  I will order carotid Doppler.  COVID EDUCATION: He is not interested in getting his Covid vaccine.   Current medicines are reviewed at length with the patient today.  The patient does not have concerns regarding medicines.  The following changes have been made:  no change  Labs/ tests ordered today include:   Orders Placed This Encounter  Procedures  . Lipid panel  . EKG 12-Lead  . VAS US CAROTID     Disposition:   FU with me in one year.     Signed, Minus Breeding, MD  10/07/2019 11:49 AM    [facility]

## 2019-10-07 ENCOUNTER — Other Ambulatory Visit: Payer: Self-pay

## 2019-10-07 ENCOUNTER — Ambulatory Visit (INDEPENDENT_AMBULATORY_CARE_PROVIDER_SITE_OTHER): Payer: Medicare Other | Admitting: Cardiology

## 2019-10-07 ENCOUNTER — Other Ambulatory Visit: Payer: Self-pay | Admitting: Family Medicine

## 2019-10-07 ENCOUNTER — Encounter: Payer: Self-pay | Admitting: Cardiology

## 2019-10-07 VITALS — BP 152/60 | HR 61 | Ht 67.0 in | Wt 163.6 lb

## 2019-10-07 DIAGNOSIS — Z7189 Other specified counseling: Secondary | ICD-10-CM | POA: Diagnosis not present

## 2019-10-07 DIAGNOSIS — R0989 Other specified symptoms and signs involving the circulatory and respiratory systems: Secondary | ICD-10-CM | POA: Diagnosis not present

## 2019-10-07 DIAGNOSIS — I2510 Atherosclerotic heart disease of native coronary artery without angina pectoris: Secondary | ICD-10-CM

## 2019-10-07 DIAGNOSIS — E785 Hyperlipidemia, unspecified: Secondary | ICD-10-CM | POA: Diagnosis not present

## 2019-10-07 DIAGNOSIS — I10 Essential (primary) hypertension: Secondary | ICD-10-CM

## 2019-10-07 DIAGNOSIS — F419 Anxiety disorder, unspecified: Secondary | ICD-10-CM

## 2019-10-07 LAB — LIPID PANEL
Chol/HDL Ratio: 1.8 ratio (ref 0.0–5.0)
Cholesterol, Total: 196 mg/dL (ref 100–199)
HDL: 111 mg/dL (ref >39)
LDL Chol Calc (NIH): 75 mg/dL (ref 0–99)
Triglycerides: 50 mg/dL (ref 0–149)
VLDL Cholesterol Cal: 10 mg/dL (ref 5–40)

## 2019-10-07 MED ORDER — NITROGLYCERIN 0.4 MG SL SUBL: 0.4000 mg | SUBLINGUAL_TABLET | SUBLINGUAL | 3 refills | Status: DC | PRN

## 2019-10-07 NOTE — Telephone Encounter (Signed)
LAST APPOINTMENT DATE: 07/13/2019   NEXT APPOINTMENT DATE: Visit date not found   Rx Xanax LAST REFILL:06/13/2019  QTY: 30 3 Rf

## 2019-10-07 NOTE — Patient Instructions (Signed)
Medication Instructions:  No changes *If you need a refill on your cardiac medications before your next appointment, please call your pharmacy*  Lab Work: Your physician recommends that you return for lab work: today (lipids) If you have labs (blood work) drawn today and your tests are completely normal, you will receive your results only by: Marland Kitchen MyChart Message (if you have MyChart) OR . A paper copy in the mail If you have any lab test that is abnormal or we need to change your treatment, we will call you to review the results.  Testing/Procedures: Your physician has requested that you have a carotid duplex. This test is an ultrasound of the carotid arteries in your neck. It looks at blood flow through these arteries that supply the brain with blood. Allow one hour for this exam. There are no restrictions or special instructions.  Follow-Up: At [facility], you and your health needs are our priority.  As part of our continuing mission to provide you with exceptional heart care, we have created designated Provider Care Teams.  These Care Teams include your primary Cardiologist (physician) and Advanced Practice Providers (APPs -  Physician Assistants and Nurse Practitioners) who all work together to provide you with the care you need, when you need it.  We recommend signing up for the patient portal called "MyChart".  Sign up information is provided on this After Visit Summary.  MyChart is used to connect with patients for Virtual Visits (Telemedicine).  Patients are able to view lab/test results, encounter notes, upcoming appointments, etc.  Non-urgent messages can be sent to your provider as well.   To learn more about what you can do with MyChart, go to [email or web address].    Your next appointment:   12 month(s)  You will receive a reminder letter in the mail two months in advance. If you don't receive a letter, please call our office to schedule the follow-up appointment.  The  format for your next appointment:   In Person  Provider:   Minus Breeding, MD

## 2019-10-11 ENCOUNTER — Ambulatory Visit (HOSPITAL_COMMUNITY)
Admission: RE | Admit: 2019-10-11 | Discharge: 2019-10-11 | Disposition: A | Discharge status: Home / Self Care | Payer: Medicare Other | Source: Ambulatory Visit | Attending: Cardiovascular Disease | Admitting: Cardiovascular Disease

## 2019-10-11 ENCOUNTER — Other Ambulatory Visit: Payer: Self-pay

## 2019-10-11 ENCOUNTER — Other Ambulatory Visit (HOSPITAL_COMMUNITY): Payer: Self-pay | Admitting: Cardiology

## 2019-10-11 DIAGNOSIS — R0989 Other specified symptoms and signs involving the circulatory and respiratory systems: Secondary | ICD-10-CM | POA: Diagnosis not present

## 2019-10-11 DIAGNOSIS — I6523 Occlusion and stenosis of bilateral carotid arteries: Secondary | ICD-10-CM

## 2019-10-12 ENCOUNTER — Telehealth: Payer: Self-pay | Admitting: Cardiology

## 2019-10-12 NOTE — Telephone Encounter (Signed)
Spoke with the patients daughter, [name]. She is aware of the recent lab results and verbalized understanding.   The patients daughter states that her father had an ultrasound yesterday and would like Dr. Percival Spanish to call her directly with the results rather than the patient as he will not be able to understand/retain the information.   [name] [phone]([id number])[phone]

## 2019-10-12 NOTE — Telephone Encounter (Signed)
Patient's daughter, [name], returning call from yesterday regarding lab results. Please call back.

## 2019-10-14 NOTE — Telephone Encounter (Signed)
Patients daughter [name] (Ok-per DPR) notified of the following Carotid Doppler results:  Minus Breeding, MD  10/12/2019 11:56 AM EDT     Right Carotid: Velocities in the right ICA are consistent with a 40-59%        stenosis.  Left Carotid: Velocities in the left ICA are consistent with a 40-59% stenosis. Please schedule repeat follow up Doppler in one year. Send results to Vivi Barrack, MD Also, please call his daughter with these results. She requested direct communication of results. Thanks.   Patients daughter made aware and will relay results to patient. Verbalized understanding.

## 2019-10-14 NOTE — Telephone Encounter (Signed)
Follow Up:     Daughter is calling to see if pt's Doppler results are ready please.

## 2019-12-01 ENCOUNTER — Other Ambulatory Visit: Payer: Self-pay | Admitting: Family Medicine

## 2019-12-01 DIAGNOSIS — G479 Sleep disorder, unspecified: Secondary | ICD-10-CM

## 2019-12-13 ENCOUNTER — Other Ambulatory Visit: Payer: Self-pay | Admitting: Family Medicine

## 2019-12-13 DIAGNOSIS — I10 Essential (primary) hypertension: Secondary | ICD-10-CM

## 2019-12-13 DIAGNOSIS — E781 Pure hyperglyceridemia: Secondary | ICD-10-CM

## 2020-01-31 ENCOUNTER — Other Ambulatory Visit: Payer: Self-pay | Admitting: Family Medicine

## 2020-01-31 DIAGNOSIS — G479 Sleep disorder, unspecified: Secondary | ICD-10-CM

## 2020-02-18 ENCOUNTER — Other Ambulatory Visit: Payer: Self-pay | Admitting: Family Medicine

## 2020-02-18 DIAGNOSIS — F419 Anxiety disorder, unspecified: Secondary | ICD-10-CM

## 2020-02-20 NOTE — Telephone Encounter (Signed)
LAST APPOINTMENT DATE: 07/12/2019   NEXT APPOINTMENT DATE: Visit date not found   Rx Xanax LAST REFILL: 10/07/2019  QTY:  30 REF 3

## 2020-02-29 ENCOUNTER — Other Ambulatory Visit: Payer: Self-pay | Admitting: Family Medicine

## 2020-02-29 DIAGNOSIS — G479 Sleep disorder, unspecified: Secondary | ICD-10-CM

## 2020-03-26 ENCOUNTER — Telehealth: Payer: Self-pay

## 2020-03-26 DIAGNOSIS — E781 Pure hyperglyceridemia: Secondary | ICD-10-CM

## 2020-03-26 DIAGNOSIS — I10 Essential (primary) hypertension: Secondary | ICD-10-CM

## 2020-03-26 MED ORDER — AMLODIPINE BESYLATE 10 MG PO TABS: 10.0000 mg | ORAL_TABLET | Freq: Every day | ORAL | 5 refills | Status: DC

## 2020-03-26 MED ORDER — LISINOPRIL 40 MG PO TABS: 40.0000 mg | ORAL_TABLET | Freq: Every day | ORAL | 3 refills | Status: DC

## 2020-03-26 MED ORDER — PRAVASTATIN SODIUM 80 MG PO TABS: 80.0000 mg | ORAL_TABLET | Freq: Every day | ORAL | 5 refills | Status: DC

## 2020-03-26 NOTE — Telephone Encounter (Signed)
Refills sent

## 2020-03-26 NOTE — Telephone Encounter (Signed)
..   LAST APPOINTMENT DATE: 02/29/2020   NEXT APPOINTMENT DATE:@Visit  date not found  MEDICATION:pravastatin (PRAVACHOL) 80 MG tablet amLODipine (NORVASC) 10 MG tablet lisinopril (ZESTRIL) 40 MG tablet ( 30 day)       [facility]ACY[id number] [place], [place] [address]

## 2020-04-30 ENCOUNTER — Other Ambulatory Visit: Payer: Self-pay | Admitting: Family Medicine

## 2020-04-30 DIAGNOSIS — G479 Sleep disorder, unspecified: Secondary | ICD-10-CM

## 2020-05-08 ENCOUNTER — Telehealth: Payer: Self-pay | Admitting: Family Medicine

## 2020-05-08 NOTE — Telephone Encounter (Signed)
Left message for patient to call back and schedule Medicare Annual Wellness Visit (AWV) either virtually OR in office.   Last AWV 12/03/16; please schedule at anytime with [facility]Nurse Health Advisor at [facility].  This should be a 45 minute visit.

## 2020-05-30 ENCOUNTER — Other Ambulatory Visit: Payer: Self-pay | Admitting: Family Medicine

## 2020-05-30 DIAGNOSIS — G479 Sleep disorder, unspecified: Secondary | ICD-10-CM

## 2020-06-19 ENCOUNTER — Other Ambulatory Visit: Payer: Self-pay | Admitting: Family Medicine

## 2020-06-19 DIAGNOSIS — I10 Essential (primary) hypertension: Secondary | ICD-10-CM

## 2020-06-19 DIAGNOSIS — G479 Sleep disorder, unspecified: Secondary | ICD-10-CM

## 2020-06-19 DIAGNOSIS — E781 Pure hyperglyceridemia: Secondary | ICD-10-CM

## 2020-06-21 ENCOUNTER — Other Ambulatory Visit: Payer: Self-pay | Admitting: Family Medicine

## 2020-06-21 ENCOUNTER — Telehealth: Payer: Self-pay

## 2020-06-21 DIAGNOSIS — F419 Anxiety disorder, unspecified: Secondary | ICD-10-CM

## 2020-06-21 NOTE — Telephone Encounter (Signed)
.   LAST APPOINTMENT DATE: 06/21/2020   NEXT APPOINTMENT DATE:@5 /11/2020  MEDICATION: ALPRAZolam (XANAX) 0.5 MG tablet   [id number] [place], [place] [address]

## 2020-06-21 NOTE — Telephone Encounter (Signed)
Refilled request send to PCP

## 2020-06-26 ENCOUNTER — Other Ambulatory Visit: Payer: Self-pay

## 2020-06-26 ENCOUNTER — Ambulatory Visit (INDEPENDENT_AMBULATORY_CARE_PROVIDER_SITE_OTHER): Payer: Medicare Other | Admitting: Family Medicine

## 2020-06-26 ENCOUNTER — Encounter: Payer: Self-pay | Admitting: Family Medicine

## 2020-06-26 VITALS — BP 167/64 | HR 69 | Temp 98.2°F | Ht 67.0 in | Wt 165.2 lb

## 2020-06-26 DIAGNOSIS — E781 Pure hyperglyceridemia: Secondary | ICD-10-CM

## 2020-06-26 DIAGNOSIS — N183 Chronic kidney disease, stage 3 unspecified: Secondary | ICD-10-CM

## 2020-06-26 DIAGNOSIS — I10 Essential (primary) hypertension: Secondary | ICD-10-CM | POA: Diagnosis not present

## 2020-06-26 DIAGNOSIS — G4700 Insomnia, unspecified: Secondary | ICD-10-CM | POA: Insufficient documentation

## 2020-06-26 DIAGNOSIS — R739 Hyperglycemia, unspecified: Secondary | ICD-10-CM | POA: Diagnosis not present

## 2020-06-26 DIAGNOSIS — Z23 Encounter for immunization: Secondary | ICD-10-CM

## 2020-06-26 LAB — COMPREHENSIVE METABOLIC PANEL
ALT: 10 U/L (ref 0–53)
AST: 20 U/L (ref 0–37)
Albumin: 4.3 g/dL (ref 3.5–5.2)
Alkaline Phosphatase: 45 U/L (ref 39–117)
BUN: 20 mg/dL (ref 6–23)
CO2: 26 mEq/L (ref 19–32)
Calcium: 9.6 mg/dL (ref 8.4–10.5)
Chloride: 95 mEq/L — ABNORMAL LOW (ref 96–112)
Creatinine, Ser: 1.47 mg/dL (ref 0.40–1.50)
GFR: 43.71 mL/min — ABNORMAL LOW (ref >60.00)
Glucose, Bld: 99 mg/dL (ref 70–99)
Potassium: 4.1 mEq/L (ref 3.5–5.1)
Sodium: 129 mEq/L — ABNORMAL LOW (ref 135–145)
Total Bilirubin: 0.6 mg/dL (ref 0.2–1.2)
Total Protein: 7.8 g/dL (ref 6.0–8.3)

## 2020-06-26 LAB — TSH: TSH: 2.14 u[IU]/mL (ref 0.35–4.50)

## 2020-06-26 LAB — LIPID PANEL
Cholesterol: 192 mg/dL (ref 0–200)
HDL: 101.7 mg/dL (ref >39.00)
LDL Cholesterol: 82 mg/dL (ref 0–99)
NonHDL: 90.78
Total CHOL/HDL Ratio: 2
Triglycerides: 43 mg/dL (ref 0.0–149.0)
VLDL: 8.6 mg/dL (ref 0.0–40.0)

## 2020-06-26 LAB — CBC
HCT: 42 % (ref 39.0–52.0)
Hemoglobin: 14.3 g/dL (ref 13.0–17.0)
MCHC: 34.1 g/dL (ref 30.0–36.0)
MCV: 94.2 fl (ref 78.0–100.0)
Platelets: 255 10*3/uL (ref 150.0–400.0)
RBC: 4.46 Mil/uL (ref 4.22–5.81)
RDW: 13.9 % (ref 11.5–15.5)
WBC: 6.1 10*3/uL (ref 4.0–10.5)

## 2020-06-26 LAB — HEMOGLOBIN A1C: Hgb A1c MFr Bld: 5.4 % (ref 4.6–6.5)

## 2020-06-26 MED ORDER — MIRTAZAPINE 15 MG PO TABS: 15.0000 mg | ORAL_TABLET | Freq: Every day | ORAL | 5 refills | Status: DC

## 2020-06-26 NOTE — Addendum Note (Signed)
Addended by: Betti Cruz on: 06/26/2020 02:13 PM   Modules accepted: Orders

## 2020-06-26 NOTE — Assessment & Plan Note (Signed)
On pravastatin 80mg  daily. Check lipids.

## 2020-06-26 NOTE — Assessment & Plan Note (Signed)
Check labs 

## 2020-06-26 NOTE — Patient Instructions (Signed)
It was very nice to see you today!  Please try the Remeron.  This should help with your sleep and appetite.  Send a message in a few weeks cologuard this is working for you.  We will check blood work today.  We will give you the pneumonia vaccine today.  Given on your blood pressure and let me know if persistently 150/90 or higher.  Like to see you back in 6 to 12 months.  Please come back to see me sooner if needed.  Take care, Dr Jerline Pain  PLEASE NOTE:  If you had any lab tests please let us know if you have not heard back within a few days. You may see your results on mychart before we have a chance to review them but we will give you a call once they are reviewed by [place]. If we ordered any referrals today, please let us know if you have not heard from their office within the next week.   Please try these tips to maintain a healthy lifestyle:   Eat at least 3 REAL meals and 1-2 snacks per day.  Aim for no more than 5 hours between eating.  If you eat breakfast, please do so within one hour of getting up.    Each meal should contain half fruits/vegetables, one quarter protein, and one quarter carbs (no bigger than a computer mouse)   Cut down on sweet beverages. This includes juice, soda, and sweet tea.     Drink at least 1 glass of water with each meal and aim for at least 8 glasses per day   Exercise at least 150 minutes every week.

## 2020-06-26 NOTE — Assessment & Plan Note (Signed)
Above goal though previously well controlled.  Well-controlled at home.  We will continue home monitoring goal 150/90 per JNC 8 guidelines.  Continue lisinopril 40 mg daily and amlodipine 10 mg daily.  Check labs today.

## 2020-06-26 NOTE — Progress Notes (Signed)
   [name] [name] is a 86 y.o. male who presents today for an office visit.  Assessment/Plan:  Chronic Problems Addressed Today: Essential hypertension Above goal though previously well controlled.  Well-controlled at home.  We will continue home monitoring goal 150/90 per JNC 8 guidelines.  Continue lisinopril 40 mg daily and amlodipine 10 mg daily.  Check labs today.  HLD (hyperlipidemia) On pravastatin 80mg  daily. Check lipids.   Insomnia Not controlled with current regimen.  Does not feel that Seroquel is helping.  Will stop.  Start Remeron 15 mg nightly.  Discussed potential side effects.  They will follow-up with me in a few weeks via MyChart or phone call.  We can titrate the dose as needed.  CKD (chronic kidney disease) stage 3, GFR 30-59 ml/min (HCC) Check labs.  Preventative Healthcare Check labs.  Will give Pneumovax today.  Up-to-date on other vaccines and screenings.    Subjective:  HPI:  See A/P       Objective:  Physical Exam: BP (!) 167/64   Pulse 69   Temp 98.2 F (36.8 C)   Ht 5\' 7"  (1.702 m)   Wt 165 lb 3.2 oz (74.9 kg)   SpO2 97%   BMI 25.87 kg/m   Gen: No acute distress, resting comfortably CV: Regular rate and rhythm with no murmurs appreciated Pulm: Normal work of breathing, clear to auscultation bilaterally with no crackles, wheezes, or rhonchi Neuro: Grossly normal, moves all extremities Psych: Normal affect and thought content      [name] [name] M. Jerline Pain, MD 06/26/2020 11:02 AM

## 2020-06-26 NOTE — Assessment & Plan Note (Signed)
Not controlled with current regimen.  Does not feel that Seroquel is helping.  Will stop.  Start Remeron 15 mg nightly.  Discussed potential side effects.  They will follow-up with me in a few weeks via MyChart or phone call.  We can titrate the dose as needed.

## 2020-06-27 NOTE — Progress Notes (Signed)
Please inform patient of the following:  His sodium is low but everything else is stable.  Please ask him to come back in a couple weeks to recheck BMET.  We can recheck everything else in year.

## 2020-06-29 ENCOUNTER — Other Ambulatory Visit: Payer: Self-pay | Admitting: *Deleted

## 2020-06-29 DIAGNOSIS — E871 Hypo-osmolality and hyponatremia: Secondary | ICD-10-CM

## 2020-07-04 ENCOUNTER — Telehealth: Payer: Self-pay

## 2020-07-04 NOTE — Telephone Encounter (Signed)
As we discussed at office visit we were starting at a low dose.  I would like to try to increase the dose to 30 mg nightly for a week or 2 and then have them check back in with [place] to see how he is doing.  Algis Greenhouse. Jerline Pain, MD 07/04/2020 11:45 AM

## 2020-07-04 NOTE — Telephone Encounter (Signed)
Patient Daughter aware  Patient will let us know if not improving

## 2020-07-04 NOTE — Telephone Encounter (Signed)
Please advise 

## 2020-07-04 NOTE — Telephone Encounter (Signed)
mirtazapine (REMERON) 15 MG tablet patients daughter states the patient states the medication is not working and want to know if they could take another pill or what they can do

## 2020-07-10 ENCOUNTER — Other Ambulatory Visit: Payer: Self-pay

## 2020-07-10 ENCOUNTER — Other Ambulatory Visit (INDEPENDENT_AMBULATORY_CARE_PROVIDER_SITE_OTHER): Payer: Medicare Other

## 2020-07-10 DIAGNOSIS — E871 Hypo-osmolality and hyponatremia: Secondary | ICD-10-CM | POA: Diagnosis not present

## 2020-07-10 LAB — BASIC METABOLIC PANEL
BUN: 17 mg/dL (ref 6–23)
CO2: 25 mEq/L (ref 19–32)
Calcium: 10 mg/dL (ref 8.4–10.5)
Chloride: 96 mEq/L (ref 96–112)
Creatinine, Ser: 1.27 mg/dL (ref 0.40–1.50)
GFR: 52.08 mL/min — ABNORMAL LOW (ref >60.00)
Glucose, Bld: 89 mg/dL (ref 70–99)
Potassium: 4.1 mEq/L (ref 3.5–5.1)
Sodium: 131 mEq/L — ABNORMAL LOW (ref 135–145)

## 2020-07-11 NOTE — Progress Notes (Signed)
Please inform patient of the following:  Sodium numbers are back to baseline.  Do not need to do any further testing at this point.

## 2020-07-13 ENCOUNTER — Other Ambulatory Visit: Payer: Self-pay

## 2020-07-13 MED ORDER — MIRTAZAPINE 30 MG PO TABS: 30.0000 mg | ORAL_TABLET | Freq: Every day | ORAL | 1 refills | Status: DC

## 2020-07-18 ENCOUNTER — Other Ambulatory Visit: Payer: Self-pay | Admitting: Family Medicine

## 2020-07-18 ENCOUNTER — Telehealth: Payer: Self-pay

## 2020-07-18 NOTE — Telephone Encounter (Signed)
error 

## 2020-07-23 ENCOUNTER — Telehealth: Payer: Self-pay

## 2020-07-23 ENCOUNTER — Other Ambulatory Visit: Payer: Self-pay

## 2020-07-23 MED ORDER — MIRTAZAPINE 30 MG PO TABS: 30.0000 mg | ORAL_TABLET | Freq: Every day | ORAL | 0 refills | Status: DC

## 2020-07-23 NOTE — Telephone Encounter (Signed)
Rx sent in for 10 day Supply

## 2020-07-23 NOTE — Telephone Encounter (Signed)
  LAST APPOINTMENT DATE: 07/18/2020   NEXT APPOINTMENT DATE:@Visit  date not found  MEDICATION:mirtazapine (REMERON) 30 MG tablet  [id number] [place], [place] [address]  Comments: [facility] hasnt sent out the prescription, and daughter is wanting to know if they can [facility] [address]  Please advise

## 2020-08-08 ENCOUNTER — Telehealth: Payer: Self-pay

## 2020-08-08 NOTE — Telephone Encounter (Signed)
Please advise 

## 2020-08-08 NOTE — Telephone Encounter (Signed)
Patient's daughter called in stating that the REMERON 30 mg dose is ineffective. Wanting to know if he should take a stronger dose or try a different medication

## 2020-08-09 MED ORDER — MIRTAZAPINE 45 MG PO TABS: 45.0000 mg | ORAL_TABLET | Freq: Every day | ORAL | 2 refills | Status: DC

## 2020-08-09 NOTE — Telephone Encounter (Signed)
Spoke to pt's daughter Clarene Critchley told her Dr. Jerline Pain said okay to increase Remeron to 45 mg daily and I will send Rx to the pharmacy. Clarene Critchley verbalized understanding. Rx sent

## 2020-08-09 NOTE — Addendum Note (Signed)
Addended by: Marian Sorrow on: 08/09/2020 01:11 PM   Modules accepted: Orders

## 2020-08-09 NOTE — Telephone Encounter (Signed)
Ok to increase dose to 45mg  daily.  Algis Greenhouse. Jerline Pain, MD 08/09/2020 12:49 PM

## 2020-09-02 ENCOUNTER — Other Ambulatory Visit: Payer: Self-pay | Admitting: Family Medicine

## 2020-09-20 ENCOUNTER — Other Ambulatory Visit: Payer: Self-pay | Admitting: Family Medicine

## 2020-10-04 ENCOUNTER — Other Ambulatory Visit: Payer: Self-pay | Admitting: Cardiology

## 2020-10-04 DIAGNOSIS — I6523 Occlusion and stenosis of bilateral carotid arteries: Secondary | ICD-10-CM

## 2020-10-10 DIAGNOSIS — R0989 Other specified symptoms and signs involving the circulatory and respiratory systems: Secondary | ICD-10-CM | POA: Insufficient documentation

## 2020-10-10 NOTE — Progress Notes (Signed)
Cardiology Office Note   Date:  10/11/2020   ID:  [name] [name] February 12, 1935, MRN [id number]  PCP:  Vivi Barrack, [name] [name]  Cardiologist:   [name] Breeding, [name] [name]   Chief Complaint  Patient presents with   Atrial Fibrillation       History of Present Illness: [name] [name] is a 86 y.o. male who presents for follow up of HTN and CAD.   I have been seeing him for HTN.  Since I last saw him he has done well.  Has had no new cardiovascular complaints.  He denies any palpitations, presyncope or syncope.    He remains very active.  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  He denies any cardiovascular symptoms related to this.  He has no palpitations, presyncope or syncope.  He said no chest pressure.  He has no new shortness of breath.   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     mirtazapine (REMERON) 45 MG tablet TAKE 1 TABLET BY MOUTH AT BEDTIME. 90 tablet 0   pravastatin (PRAVACHOL) 80 MG tablet TAKE 1 TABLET BY MOUTH EVERY DAY 90 tablet 1   spironolactone (ALDACTONE) 25 MG tablet Take 0.5 tablets (12.5  mg total) by mouth daily. 45 tablet 3   lisinopril (ZESTRIL) 40 MG tablet Take 1 tablet (40 mg total) by mouth daily. 90 tablet 3   nitroGLYCERIN (NITROSTAT) 0.4 MG SL tablet Place 1 tablet (0.4 mg total) under the tongue every 5 (five) minutes as needed for chest pain. 25 tablet 3   No current facility-administered medications for this visit.    Allergies:   Patient has no known allergies.   ROS:  Please see the history of present illness.   Otherwise, review of systems are positive for joint pain.   All other systems are reviewed and negative.    PHYSICAL EXAM: VS:  BP (!) 197/78 (BP Location: Right Arm)   Pulse 68   Ht 5\' 7"  (1.702 m)   Wt 165 lb 12.8 oz (75.2 kg)   SpO2 91%   BMI 25.97 kg/m  , BMI Body mass index is 25.97 kg/m. GENERAL:  Well appearing NECK:  No jugular venous distention, waveform within normal limits, carotid upstroke brisk and symmetric, no bruits, no thyromegaly LUNGS:  Clear to auscultation bilaterally CHEST:  Unremarkable HEART:  PMI not displaced or sustained,S1 and S2 within normal limits, no S3, no S4, no clicks, no rubs, no murmurs ABD:  Flat, positive bowel sounds normal  in frequency in pitch, no bruits, no rebound, no guarding, no midline pulsatile mass, no hepatomegaly, no splenomegaly EXT:  2 plus pulses throughout, no edema, no cyanosis no clubbing    EKG:  EKG is  ordered today. The ekg ordered today demonstrates sinus rhythm, rate 68, axis within normal limits, intervals within normal limits, no acute ST-T wave changes.   Recent Labs: 06/26/2020: ALT 10; Hemoglobin 14.3; Platelets 255.0; TSH 2.14 07/10/2020: BUN 17; Creatinine, Ser 1.27; Potassium 4.1; Sodium 131    Lipid Panel    Component Value Date/Time   CHOL 192 06/26/2020 1108   CHOL 196 10/07/2019 1136   TRIG 43.0 06/26/2020 1108   HDL 101.70 06/26/2020 1108   HDL 111 10/07/2019 1136   CHOLHDL 2 06/26/2020 1108   VLDL 8.6 06/26/2020 1108   LDLCALC 82 06/26/2020 1108    LDLCALC 75 10/07/2019 1136   LDLDIRECT 78.8 09/10/2011 0801      Wt Readings from Last 3 Encounters:  10/11/20 165 lb 12.8 oz (75.2 kg)  06/26/20 165 lb 3.2 oz (74.9 kg)  10/07/19 163 lb 9.6 oz (74.2 kg)      Other studies Reviewed: Additional studies/ records that were reviewed today include: Labs. Review of the above records demonstrates:  Please see elsewhere in the note.     ASSESSMENT AND PLAN:  HTN:  His blood pressure is elevated and I think its been consistently elevated although he says it is whitecoat hypertension.  I am going to start spironolactone 12.5 mg daily.  I do see that he is with some low sodium.  Has had some mild renal insufficiency in the past.  We will follow his creatinine closely with a BUN in 2 weeks and then again 4 weeks later.  He is going to keep a blood pressure diary and we gave him specific instructions about this.   CAD:   The patient has no new sypmtoms.  No further cardiovascular testing is indicated.  We will continue with aggressive risk reduction and meds as listed.   DYSLIPIDEMIA:     LDL was 82 with an HDL of 101.  No change in therapy.  BRUIT:   This was 40 - 59% bilateral.  This was checked today and I will follow-up the results.   Current medicines are reviewed at length with the patient today.  The patient does not have concerns regarding medicines.  The following changes have been made:  None  Labs/ tests ordered today include:   Orders Placed This Encounter  Procedures   Basic metabolic panel   Basic metabolic panel   EKG 64-RCVK      Disposition:   FU with me in 12 months    Signed, [name] Breeding, [name] [name]  10/11/2020 11:48 AM    [place]

## 2020-10-11 ENCOUNTER — Ambulatory Visit (INDEPENDENT_AMBULATORY_CARE_PROVIDER_SITE_OTHER): Payer: Medicare Other | Admitting: Cardiology

## 2020-10-11 ENCOUNTER — Other Ambulatory Visit: Payer: Self-pay

## 2020-10-11 ENCOUNTER — Encounter: Payer: Self-pay | Admitting: Cardiology

## 2020-10-11 ENCOUNTER — Ambulatory Visit (HOSPITAL_COMMUNITY)
Admission: RE | Admit: 2020-10-11 | Discharge: 2020-10-11 | Disposition: A | Discharge status: Home / Self Care | Payer: Medicare Other | Source: Ambulatory Visit | Attending: Cardiology | Admitting: Cardiology

## 2020-10-11 ENCOUNTER — Other Ambulatory Visit (HOSPITAL_COMMUNITY): Payer: Self-pay | Admitting: Cardiology

## 2020-10-11 ENCOUNTER — Other Ambulatory Visit: Payer: Self-pay | Admitting: Family Medicine

## 2020-10-11 VITALS — BP 197/78 | HR 68 | Ht 67.0 in | Wt 165.8 lb

## 2020-10-11 DIAGNOSIS — E781 Pure hyperglyceridemia: Secondary | ICD-10-CM

## 2020-10-11 DIAGNOSIS — I10 Essential (primary) hypertension: Secondary | ICD-10-CM

## 2020-10-11 DIAGNOSIS — R0989 Other specified symptoms and signs involving the circulatory and respiratory systems: Secondary | ICD-10-CM

## 2020-10-11 DIAGNOSIS — E785 Hyperlipidemia, unspecified: Secondary | ICD-10-CM | POA: Diagnosis not present

## 2020-10-11 DIAGNOSIS — I6523 Occlusion and stenosis of bilateral carotid arteries: Secondary | ICD-10-CM

## 2020-10-11 DIAGNOSIS — I2510 Atherosclerotic heart disease of native coronary artery without angina pectoris: Secondary | ICD-10-CM | POA: Diagnosis not present

## 2020-10-11 MED ORDER — SPIRONOLACTONE 25 MG PO TABS: 12.5000 mg | ORAL_TABLET | Freq: Every day | ORAL | 3 refills | Status: DC

## 2020-10-11 NOTE — Patient Instructions (Signed)
Medication Instructions:  START: SPIRONOLACTONE 12.5mg  DAILY  *If you need a refill on your cardiac medications before your next appointment, please call your pharmacy*  Lab Work: [place] BMET BLOOD WORK DONE IN 2 WEEKS AT [facility] 10/25/20 THEN REPEAT BMET BLOOD WORK AGAIN 4 WEEKS AFTER THAT ONE AROUND 11/24/20 If you have labs (blood work) drawn today and your tests are completely normal, you will receive your results only by: [place] (if you have MyChart) OR A paper copy in the mail If you have any lab test that is abnormal or we need to change your treatment, we will call you to review the results.  Follow-Up: At [facility], you and your health needs are our priority.  As part of our continuing mission to provide you with exceptional heart care, we have created designated Provider Care Teams.  These Care Teams include your primary Cardiologist (physician) and Advanced Practice Providers (APPs -  Physician Assistants and Nurse Practitioners) who all work together to provide you with the care you need, when you need it.  We recommend signing up for the patient portal called "MyChart".  Sign up information is provided on this After Visit Summary.  MyChart is used to connect with patients for Virtual Visits (Telemedicine).  Patients are able to view lab/test results, encounter notes, upcoming appointments, etc.  Non-urgent messages can be sent to your provider as well.   To learn more about what you can do with MyChart, go to [email or web address].    Your next appointment:   1 year(s)  The format for your next appointment:   In Person  Provider:   Minus Breeding, MD  Other Instructions [place]

## 2020-10-12 ENCOUNTER — Encounter: Payer: Self-pay | Admitting: *Deleted

## 2020-10-12 ENCOUNTER — Other Ambulatory Visit: Payer: Self-pay | Admitting: Family Medicine

## 2020-10-12 DIAGNOSIS — F419 Anxiety disorder, unspecified: Secondary | ICD-10-CM

## 2020-10-25 ENCOUNTER — Other Ambulatory Visit: Payer: Self-pay

## 2020-10-25 DIAGNOSIS — I10 Essential (primary) hypertension: Secondary | ICD-10-CM | POA: Diagnosis not present

## 2020-10-25 DIAGNOSIS — R0989 Other specified symptoms and signs involving the circulatory and respiratory systems: Secondary | ICD-10-CM

## 2020-10-25 DIAGNOSIS — I2510 Atherosclerotic heart disease of native coronary artery without angina pectoris: Secondary | ICD-10-CM

## 2020-10-25 LAB — BASIC METABOLIC PANEL
BUN/Creatinine Ratio: 12 (ref 10–24)
BUN: 19 mg/dL (ref 8–27)
CO2: 22 mmol/L (ref 20–29)
Calcium: 10.1 mg/dL (ref 8.6–10.2)
Chloride: 92 mmol/L — ABNORMAL LOW (ref 96–106)
Creatinine, Ser: 1.54 mg/dL — ABNORMAL HIGH (ref 0.76–1.27)
Glucose: 107 mg/dL — ABNORMAL HIGH (ref 65–99)
Potassium: 5.1 mmol/L (ref 3.5–5.2)
Sodium: 129 mmol/L — ABNORMAL LOW (ref 134–144)
eGFR: 44 mL/min/{1.73_m2} — ABNORMAL LOW (ref >59)

## 2020-11-09 DIAGNOSIS — I2510 Atherosclerotic heart disease of native coronary artery without angina pectoris: Secondary | ICD-10-CM | POA: Diagnosis not present

## 2020-11-09 DIAGNOSIS — I10 Essential (primary) hypertension: Secondary | ICD-10-CM | POA: Diagnosis not present

## 2020-11-09 DIAGNOSIS — R0989 Other specified symptoms and signs involving the circulatory and respiratory systems: Secondary | ICD-10-CM | POA: Diagnosis not present

## 2020-11-09 LAB — BASIC METABOLIC PANEL
BUN/Creatinine Ratio: 16 (ref 10–24)
BUN: 24 mg/dL (ref 8–27)
CO2: 21 mmol/L (ref 20–29)
Calcium: 10.1 mg/dL (ref 8.6–10.2)
Chloride: 95 mmol/L — ABNORMAL LOW (ref 96–106)
Creatinine, Ser: 1.49 mg/dL — ABNORMAL HIGH (ref 0.76–1.27)
Glucose: 111 mg/dL — ABNORMAL HIGH (ref 65–99)
Potassium: 4.8 mmol/L (ref 3.5–5.2)
Sodium: 131 mmol/L — ABNORMAL LOW (ref 134–144)
eGFR: 46 mL/min/{1.73_m2} — ABNORMAL LOW (ref >59)

## 2020-11-12 ENCOUNTER — Encounter: Payer: Self-pay | Admitting: *Deleted

## 2020-11-23 ENCOUNTER — Telehealth: Payer: Self-pay | Admitting: *Deleted

## 2020-11-23 DIAGNOSIS — I10 Essential (primary) hypertension: Secondary | ICD-10-CM

## 2020-11-23 DIAGNOSIS — Z5181 Encounter for therapeutic drug level monitoring: Secondary | ICD-10-CM

## 2020-11-23 DIAGNOSIS — E875 Hyperkalemia: Secondary | ICD-10-CM

## 2020-11-23 MED ORDER — SPIRONOLACTONE 25 MG PO TABS: 25.0000 mg | ORAL_TABLET | Freq: Every day | ORAL | 3 refills | Status: DC

## 2020-11-23 NOTE — Telephone Encounter (Signed)
Dr Percival Spanish reviewed patients blood pressure readings Increase Spironolactone to 25 mg daily and BMET in 2 weeks Advised daughter [name], ok per [facility]

## 2020-12-12 ENCOUNTER — Telehealth: Payer: Self-pay

## 2020-12-12 NOTE — Telephone Encounter (Signed)
Please advise 

## 2020-12-12 NOTE — Telephone Encounter (Signed)
Called advise to schedule appointment with PCP

## 2020-12-12 NOTE — Telephone Encounter (Signed)
Please ask them to schedule appointment.  Algis Greenhouse. Jerline Pain, MD 12/12/2020 10:22 AM

## 2020-12-12 NOTE — Telephone Encounter (Signed)
Pt's daughter called stating that [name] [name] has been having trouble sleeping. She stated that [name] wants to know if there is any other sleeping pill he can take. [name] would like a call back. Please Advise.

## 2020-12-14 ENCOUNTER — Other Ambulatory Visit: Payer: Self-pay | Admitting: Family Medicine

## 2020-12-14 DIAGNOSIS — I10 Essential (primary) hypertension: Secondary | ICD-10-CM | POA: Diagnosis not present

## 2020-12-14 DIAGNOSIS — Z5181 Encounter for therapeutic drug level monitoring: Secondary | ICD-10-CM | POA: Diagnosis not present

## 2020-12-15 LAB — BASIC METABOLIC PANEL
BUN/Creatinine Ratio: 11 (ref 10–24)
BUN: 16 mg/dL (ref 8–27)
CO2: 21 mmol/L (ref 20–29)
Calcium: 9.9 mg/dL (ref 8.6–10.2)
Chloride: 90 mmol/L — ABNORMAL LOW (ref 96–106)
Creatinine, Ser: 1.46 mg/dL — ABNORMAL HIGH (ref 0.76–1.27)
Glucose: 92 mg/dL (ref 70–99)
Potassium: 5.8 mmol/L (ref 3.5–5.2)
Sodium: 127 mmol/L — ABNORMAL LOW (ref 134–144)
eGFR: 47 mL/min/{1.73_m2} — ABNORMAL LOW (ref >59)

## 2020-12-16 MED ORDER — LOKELMA 10 G PO PACK: 10.0000 g | PACK | Freq: Two times a day (BID) | ORAL | 0 refills | Status: AC

## 2020-12-16 NOTE — Telephone Encounter (Signed)
   Notified by Dr. Percival Spanish that patient's potassium was elevated at 5.8 on labs 12/14/2020.  He recommends patient take 2 doses of Lokelma 10 mg today and repeat blood work on Monday.  Called and spoke with his daughter [name] (DPR on file) and updated to above recommendations.  She will pick up the prescription as soon as possible.  Abigail Butts, PA-C 12/16/20; 12:46 PM

## 2020-12-16 NOTE — Addendum Note (Signed)
Addended by: Roby Lofts on: 12/16/2020 12:46 PM   Modules accepted: Orders

## 2020-12-17 ENCOUNTER — Other Ambulatory Visit: Payer: Self-pay

## 2020-12-17 DIAGNOSIS — E875 Hyperkalemia: Secondary | ICD-10-CM

## 2020-12-18 ENCOUNTER — Telehealth: Payer: Self-pay | Admitting: *Deleted

## 2020-12-18 DIAGNOSIS — N289 Disorder of kidney and ureter, unspecified: Secondary | ICD-10-CM

## 2020-12-18 LAB — BASIC METABOLIC PANEL
BUN/Creatinine Ratio: 11 (ref 10–24)
BUN: 18 mg/dL (ref 8–27)
CO2: 22 mmol/L (ref 20–29)
Calcium: 9.8 mg/dL (ref 8.6–10.2)
Chloride: 93 mmol/L — ABNORMAL LOW (ref 96–106)
Creatinine, Ser: 1.57 mg/dL — ABNORMAL HIGH (ref 0.76–1.27)
Glucose: 97 mg/dL (ref 70–99)
Potassium: 5.2 mmol/L (ref 3.5–5.2)
Sodium: 131 mmol/L — ABNORMAL LOW (ref 134–144)
eGFR: 43 mL/min/{1.73_m2} — ABNORMAL LOW (ref >59)

## 2020-12-18 NOTE — Telephone Encounter (Signed)
Spoke with pt daughter, aware of dr hochrein's recommendation. Lab orders mailed to the pt

## 2020-12-18 NOTE — Telephone Encounter (Signed)
-----   Message from Minus Breeding, MD sent at 12/18/2020  9:58 AM EDT ----- I need to stop his spironolactone and repeat a BMET in two weeks.  Call Mr. Polasek with the results and send results to Vivi Barrack, MD

## 2021-01-22 LAB — BASIC METABOLIC PANEL
BUN/Creatinine Ratio: 16 (ref 10–24)
BUN: 22 mg/dL (ref 8–27)
CO2: 23 mmol/L (ref 20–29)
Calcium: 9.7 mg/dL (ref 8.6–10.2)
Chloride: 92 mmol/L — ABNORMAL LOW (ref 96–106)
Creatinine, Ser: 1.41 mg/dL — ABNORMAL HIGH (ref 0.76–1.27)
Glucose: 98 mg/dL (ref 70–99)
Potassium: 5 mmol/L (ref 3.5–5.2)
Sodium: 131 mmol/L — ABNORMAL LOW (ref 134–144)
eGFR: 49 mL/min/{1.73_m2} — ABNORMAL LOW (ref >59)

## 2021-01-24 ENCOUNTER — Encounter: Payer: Self-pay | Admitting: *Deleted

## 2021-02-06 ENCOUNTER — Telehealth: Payer: Self-pay | Admitting: *Deleted

## 2021-02-06 MED ORDER — HYDRALAZINE HCL 10 MG PO TABS: 10.0000 mg | ORAL_TABLET | Freq: Two times a day (BID) | ORAL | 3 refills | Status: DC

## 2021-02-06 NOTE — Telephone Encounter (Signed)
Spoke with pt daughter, blood pressure readings that were dropped off in the office reviewed by dr hochrein. Aware patient is to start hydralazine 10 mg BID. New script sent to the pharmacy.

## 2021-02-27 ENCOUNTER — Telehealth: Payer: Self-pay

## 2021-02-27 NOTE — Telephone Encounter (Signed)
Daughter states patient has been seeing Dr. Yong Channel for Cortizone injection in his knee.  States he is not doing any better and would like to know who Dr. Yong Channel would refer him to for next steps?

## 2021-02-27 NOTE — Telephone Encounter (Signed)
Please advise 

## 2021-02-28 NOTE — Telephone Encounter (Signed)
Patient scheduled.

## 2021-02-28 NOTE — Telephone Encounter (Signed)
He hasnt been here since last May and I do not see where he has ever seen Dr Yong Channel. Can we have him schedule an appointment to discuss?  [name] [name]. Jerline Pain, MD 02/28/2021 8:01 AM

## 2021-02-28 NOTE — Telephone Encounter (Signed)
Please schedule patient with Dr Jerline Pain

## 2021-03-04 ENCOUNTER — Other Ambulatory Visit: Payer: Self-pay

## 2021-03-04 ENCOUNTER — Encounter: Payer: Self-pay | Admitting: Family Medicine

## 2021-03-04 ENCOUNTER — Ambulatory Visit (INDEPENDENT_AMBULATORY_CARE_PROVIDER_SITE_OTHER): Payer: Medicare HMO | Admitting: Family Medicine

## 2021-03-04 VITALS — BP 150/60 | HR 76 | Temp 97.9°F | Ht 67.0 in | Wt 172.5 lb

## 2021-03-04 DIAGNOSIS — G479 Sleep disorder, unspecified: Secondary | ICD-10-CM | POA: Diagnosis not present

## 2021-03-04 DIAGNOSIS — M1990 Unspecified osteoarthritis, unspecified site: Secondary | ICD-10-CM

## 2021-03-04 DIAGNOSIS — G4700 Insomnia, unspecified: Secondary | ICD-10-CM | POA: Diagnosis not present

## 2021-03-04 MED ORDER — GABAPENTIN 100 MG PO CAPS: 100.0000 mg | ORAL_CAPSULE | Freq: Every day | ORAL | 3 refills | Status: DC

## 2021-03-04 NOTE — Assessment & Plan Note (Signed)
He is not having much benefit with intra-articular steroid injections.  Will place referral to orthopedics for further management evaluation.

## 2021-03-04 NOTE — Assessment & Plan Note (Signed)
Not controlled.  We will continue current dose Remeron 45 mg nightly.  He also uses Xanax 0.5 mg nightly as needed for anxiety.  We discussed treatment options.  We will start low-dose gabapentin 100 mg daily. Discussed side effects. Hopefully this should help some with his arthritis pain as well.  Patient and his daughter will check with me in a couple of weeks via MyChart or phone call.

## 2021-03-04 NOTE — Patient Instructions (Signed)
It was very nice to see you today!  Please start the gabapentin. Send me a message in a few weeks to let me know how this is working for you.  I will refer you to see orthopedics.  Take care, Dr Jerline Pain  PLEASE NOTE:  If you had any lab tests please let us know if you have not heard back within a few days. You may see your results on mychart before we have a chance to review them but we will give you a call once they are reviewed by [place]. If we ordered any referrals today, please let us know if you have not heard from their office within the next week.   Please try these tips to maintain a healthy lifestyle:  Eat at least 3 REAL meals and 1-2 snacks per day.  Aim for no more than 5 hours between eating.  If you eat breakfast, please do so within one hour of getting up.   Each meal should contain half fruits/vegetables, one quarter protein, and one quarter carbs (no bigger than a computer mouse)  Cut down on sweet beverages. This includes juice, soda, and sweet tea.   Drink at least 1 glass of water with each meal and aim for at least 8 glasses per day  Exercise at least 150 minutes every week.

## 2021-03-04 NOTE — Progress Notes (Signed)
° °  [name] [name] is a 87 y.o. male who presents today for an office visit.  Assessment/Plan:  Chronic Problems Addressed Today: Osteoarthritis He is not having much benefit with intra-articular steroid injections.  Will place referral to orthopedics for further management evaluation.  Insomnia Not controlled.  We will continue current dose Remeron 45 mg nightly.  He also uses Xanax 0.5 mg nightly as needed for anxiety.  We discussed treatment options.  We will start low-dose gabapentin 100 mg daily. Discussed side effects. Hopefully this should help some with his arthritis pain as well.  Patient and his daughter will check with me in a couple of weeks via MyChart or phone call.     Subjective:  HPI:  See A/P for status of chronic conditions.  Patient was last seen here about 8 months ago.  Main concerns today are ongoing knee pain.  He has had intermittent cortisone injections in his knees.  Does not feel like this has been particularly helpful.  Sometimes his knee will give out on him.  He would like to see an orthopedic surgeon  He still has ongoing issues with insomnia as well.  Has trouble both falling asleep and staying asleep.  We have tried Remeron which has not been particular beneficial.  He has also felt like Seroquel has not been helpful either.  Over-the-counter medications have not worked.       Objective:  Physical Exam: BP (!) 150/60    Pulse 76    Temp 97.9 F (36.6 C) (Temporal)    Ht 5\' 7"  (1.702 m)    Wt 172 lb 8 oz (78.2 kg)    SpO2 97%    BMI 27.02 kg/m   Gen: No acute distress, resting comfortably CV: Regular rate and rhythm with no murmurs appreciated Pulm: Normal work of breathing, clear to auscultation bilaterally with no crackles, wheezes, or rhonchi Neuro: Grossly normal, moves all extremities Psych: Normal affect and thought content      Belvie Iribe M. Jerline Pain, MD 03/04/2021 9:30 AM

## 2021-03-19 ENCOUNTER — Telehealth: Payer: Self-pay

## 2021-03-19 NOTE — Telephone Encounter (Signed)
States patient was prescribed gabapentin to help with sleep.  States this is not working.  Would like to know if patient can increase the dosage?

## 2021-03-20 NOTE — Telephone Encounter (Signed)
Spoke with patient daughter  Aware of information  Can take 200mg  nightly

## 2021-03-20 NOTE — Telephone Encounter (Signed)
Yes. He can increase to 200mg  nightly.  Algis Greenhouse. Jerline Pain, MD 03/20/2021 8:58 AM

## 2021-03-20 NOTE — Telephone Encounter (Signed)
Please advise 

## 2021-04-21 ENCOUNTER — Other Ambulatory Visit: Payer: Self-pay | Admitting: Family Medicine

## 2021-04-21 DIAGNOSIS — F419 Anxiety disorder, unspecified: Secondary | ICD-10-CM

## 2021-04-21 DIAGNOSIS — I10 Essential (primary) hypertension: Secondary | ICD-10-CM

## 2021-04-21 DIAGNOSIS — E781 Pure hyperglyceridemia: Secondary | ICD-10-CM

## 2021-04-29 ENCOUNTER — Telehealth: Payer: Self-pay | Admitting: Family Medicine

## 2021-04-29 ENCOUNTER — Other Ambulatory Visit: Payer: Self-pay

## 2021-04-29 MED ORDER — GABAPENTIN 100 MG PO CAPS: 200.0000 mg | ORAL_CAPSULE | Freq: Every day | ORAL | 3 refills | Status: DC

## 2021-04-29 NOTE — Telephone Encounter (Signed)
[name] [name] pt daughter verified DOB, advised corrected prescription to indicate dosage change per Dr Jerline Pain note. Sent new Rx over to pharmacy ?

## 2021-04-29 NOTE — Telephone Encounter (Signed)
Pt's daughter states her father cannot pick up his prescription due to it being too early for pick up. [place] increased meds from 100 mg to 200 mg on 03/20/21. Pt is currently our of medication. Please advise ?

## 2021-07-18 ENCOUNTER — Telehealth: Payer: Self-pay | Admitting: Family Medicine

## 2021-07-18 ENCOUNTER — Telehealth: Payer: Self-pay | Admitting: Cardiology

## 2021-07-18 NOTE — Telephone Encounter (Signed)
Pt was advised to go to ED  Patient Name: [name] [name] Gender: Male DOB: 1934/12/23 Age: 87 Y 11 M 28 D Return Phone Number: [phone] (Primary) Address: City/ State/ Zip: [place] [address] Client [place] [facility] [place] Client Site [place] [facility] [place] Night Provider Dimas Chyle- MD Contact Type Call Who Is Calling Patient / Member / Family / Caregiver Call Type Triage / Clinical Caller Name [name] Relationship To Patient Daughter Return Phone Number [phone] (Primary) Chief Complaint Blood Pressure Low Reason for Call Symptomatic / Request for [place] states that her father's blood pressure is 124/50 which is low for him. Should he see the doctor? Translation No Nurse Assessment Nurse: Fredderick Phenix, RN, Lelan Pons Date/Time (Eastern Time): 07/18/2021 7:41:12 AM Confirm and document reason for call. If symptomatic, describe symptoms. ---Caller states that her father's blood pressure is 124/50 which is low for him. He gets lightheaded when he stands up. Going on for a couple weeks. Does the patient have any new or worsening symptoms? ---Yes Will a triage be completed? ---Yes Related visit to physician within the last 2 weeks? ---No Does the PT have any chronic conditions? (i.e. diabetes, asthma, this includes High risk factors for pregnancy, etc.) ---Yes List chronic conditions. ---cardiac stents Is this a behavioral health or substance abuse call? ---No Guidelines Guideline Title Affirmed Question Affirmed Notes Nurse Date/Time (Eastern Time) Blood Pressure - Low [1] Fall in systolic BP > 20 mm Hg from normal AND [2] dizzy, lightheaded, or weak Stormy Card 07/18/2021 7:42:39 AM Disp. Time Eilene Ghazi Time) Disposition Final User 07/18/2021 7:47:11 AM Go to ED Now (or PCP triage) Yes Fredderick Phenix, RN, [place] Disagree/Comply Comply Caller Understands Yes PreDisposition Did not  know what to do Care Advice Given Per Guideline GO TO ED NOW (OR PCP TRIAGE): * IF NO PCP (PRIMARY CARE PROVIDER) SECOND-LEVEL TRIAGE: You need to be seen within the next hour. Go to the [facility] at _____________ [place] as soon as you can. ANOTHER ADULT SHOULD DRIVE: * It is better and safer if another adult drives instead of you. CARE ADVICE given per Low Blood Pressure (Adult) guideline. Referrals REFERRED TO PCP OFFICE

## 2021-07-18 NOTE — Progress Notes (Signed)
Cardiology Office Note   Date:  07/19/2021   ID:  [name], [name] 24-Aug-1934, MRN [id number]  PCP:  Ardith Dark, MD  Cardiologist:   Rollene Rotunda, MD   Chief Complaint  Patient presents with   Dizziness    History of Present Illness: [name] [name] is a 87 y.o. male who presents for follow up of HTN and CAD.   Since I last saw him he has done well from a cardiovascular standpoint.  He still stays active.  He did get dizziness standing up the other day.  This was happening throughout the day but it actually went away.  His daughters knew that his diastolic blood pressure was running low in the 50s.  They are not reporting any lower systolic blood pressures however.  He has not been having any chest pressure, neck or arm discomfort.  He has not been having any palpitations, presyncope or syncope other than his mild episodes of orthostasis that were transient.    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     gabapentin (NEURONTIN) 100 MG capsule Take 2 capsules (200 mg total) by mouth at bedtime. 90 capsule 3   hydrALAZINE (APRESOLINE) 10 MG tablet Take 1 tablet (10 mg total) by mouth 2 (two) times daily. 180 tablet 3   lisinopril (ZESTRIL) 40  MG tablet TAKE 1 TABLET BY MOUTH EVERY DAY 90 tablet 3   mirtazapine (REMERON) 45 MG tablet TAKE 1 TABLET BY MOUTH EVERYDAY AT BEDTIME 90 tablet 0   pravastatin (PRAVACHOL) 80 MG tablet TAKE 1 TABLET BY MOUTH EVERY DAY 90 tablet 1   ALPRAZolam (XANAX) 0.5 MG tablet TAKE 1 TABLET BY MOUTH AT BEDTIME AS NEEDED FOR ANXIETY (Patient not taking: Reported on 07/19/2021) 30 tablet 5   nitroGLYCERIN (NITROSTAT) 0.4 MG SL tablet Place 1 tablet (0.4 mg total) under the tongue every 5 (five) minutes as needed for chest pain. (Patient not taking: Reported on 07/19/2021) 25 tablet 3   No current facility-administered medications for this visit.    Allergies:   Patient has no known allergies.   ROS:  Please see the history of present illness.   Otherwise, review of systems are positive insomnia.   All other systems are reviewed and negative.    PHYSICAL EXAM: VS:  BP (!) 130/57 (BP Location: Left Arm, Patient Position: Sitting, Cuff Size: Normal)   Pulse 63   Ht 5\' 8"  (1.727 m)   Wt 159 lb 6.4 oz (72.3 kg)   SpO2 95%   BMI 24.24 kg/m  , BMI Body mass index is 24.24 kg/m. GENERAL:  Well appearing NECK:  No jugular venous distention, waveform within normal limits, carotid upstroke  brisk and symmetric, no bruits, no thyromegaly LUNGS:  Clear to auscultation bilaterally CHEST:  Unremarkable HEART:  PMI not displaced or sustained,S1 and S2 within normal limits, no S3, no S4, no clicks, no rubs, none murmurs ABD:  Flat, positive bowel sounds normal in frequency in pitch, no bruits, no rebound, no guarding, no midline pulsatile mass, no hepatomegaly, no splenomegaly EXT:  2 plus pulses throughout, no edema, no cyanosis no clubbing   EKG:  EKG is   ordered today. The ekg ordered today demonstrates sinus rhythm, rate 63, axis within normal limits, intervals within normal limits, no acute ST-T wave changes.  Premature ventricular contraction   Recent Labs: 01/22/2021: BUN 22; Creatinine, Ser 1.41; Potassium  5.0; Sodium 131    Lipid Panel    Component Value Date/Time   CHOL 192 06/26/2020 1108   CHOL 196 10/07/2019 1136   TRIG 43.0 06/26/2020 1108   HDL 101.70 06/26/2020 1108   HDL 111 10/07/2019 1136   CHOLHDL 2 06/26/2020 1108   VLDL 8.6 06/26/2020 1108   LDLCALC 82 06/26/2020 1108   LDLCALC 75 10/07/2019 1136   LDLDIRECT 78.8 09/10/2011 0801      Wt Readings from Last 3 Encounters:  07/19/21 159 lb 6.4 oz (72.3 kg)  03/04/21 172 lb 8 oz (78.2 kg)  10/11/20 165 lb 12.8 oz (75.2 kg)      Other studies Reviewed: Additional studies/ records that were reviewed today include: Labs. Review of the above records demonstrates:  Please see elsewhere in the note.     ASSESSMENT AND PLAN:  HTN: His blood pressure is running a little bit on the lower side diastolic but his systolics are well controlled.  We talked about avoiding orthostatic symptoms but these do not seem to be persistent or regular.  No change in therapy.  I would reduce his blood pressure medicines if he is getting more persistent dizziness.   CAD:   The patient has no new sypmtoms.  No further cardiovascular testing is indicated.  We will continue with aggressive risk reduction and meds as listed.   DYSLIPIDEMIA:     LDL was 82 in May of last year with an HDL of 101.  I would not suggest any change.   BRUIT:   This was 40 - 59% bilateral in August 2022.  I will repeat this in August.   Current medicines are reviewed at length with the patient today.  The patient does not have concerns regarding medicines.  The following changes have been made:  None  Labs/ tests ordered today include:   Orders Placed This Encounter  Procedures   EKG 12-Lead      Disposition:   FU with me in 12 months    Signed, Rollene Rotunda, MD  07/19/2021 12:57 PM    [facility]

## 2021-07-18 NOTE — Telephone Encounter (Signed)
Returned the patient's daughter, per the dpr. She stated that the patient called her concerned over low blood pressures. His blood pressure this morning was 120/50 and yesterday it was 124/50. She was unsure if this was before or after medications. Advised that we could call the patient to verify medications and blood pressure readings but the daughter stated that the patient would not be able to verify his medications. She stated that the patient was dizzy when standing but that this subsided after he stood for a minute.   She stated that the patient takes: Amlodipine 10 mg once daily Hydralazine 10 mg twice daily Lisinopril 40 mg once daily  The daughter has been advised to keep the appointment for tomorrow with Dr. Percival Spanish and that it is also time for a yearly follow up. She has been advised to tell him to rise slowly from a sitting position and to wait until the dizziness has subsided before attempting to walk.

## 2021-07-18 NOTE — Telephone Encounter (Signed)
Pt c/o BP issue: STAT if pt c/o blurred vision, one-sided weakness or slurred speech  1. What are your last 5 BP readings?  120/50 124/50  2. Are you having any other symptoms (ex. Dizziness, headache, blurred vision, passed out)? Dizzy when he goes to stand  3. What is your BP issue? Pt's daughter states that pt's BP has been running low. She would like a callback.  Pt has scheduled appt tomorrow 07/19/21 with Dr. Percival Spanish. Please advise

## 2021-07-19 ENCOUNTER — Ambulatory Visit (INDEPENDENT_AMBULATORY_CARE_PROVIDER_SITE_OTHER): Payer: Medicare HMO | Admitting: Cardiology

## 2021-07-19 ENCOUNTER — Encounter: Payer: Self-pay | Admitting: Cardiology

## 2021-07-19 VITALS — BP 130/57 | HR 63 | Ht 68.0 in | Wt 159.4 lb

## 2021-07-19 DIAGNOSIS — I10 Essential (primary) hypertension: Secondary | ICD-10-CM | POA: Diagnosis not present

## 2021-07-19 DIAGNOSIS — E785 Hyperlipidemia, unspecified: Secondary | ICD-10-CM

## 2021-07-19 DIAGNOSIS — R0989 Other specified symptoms and signs involving the circulatory and respiratory systems: Secondary | ICD-10-CM | POA: Diagnosis not present

## 2021-07-19 DIAGNOSIS — I2510 Atherosclerotic heart disease of native coronary artery without angina pectoris: Secondary | ICD-10-CM

## 2021-07-19 NOTE — Patient Instructions (Signed)

## 2021-08-01 ENCOUNTER — Other Ambulatory Visit: Payer: Self-pay | Admitting: Family Medicine

## 2021-09-23 ENCOUNTER — Encounter: Payer: Self-pay | Admitting: Family Medicine

## 2021-09-23 ENCOUNTER — Ambulatory Visit (INDEPENDENT_AMBULATORY_CARE_PROVIDER_SITE_OTHER): Payer: Medicare HMO | Admitting: Family Medicine

## 2021-09-23 VITALS — BP 176/58 | HR 65 | Temp 97.8°F | Ht 68.0 in | Wt 159.6 lb

## 2021-09-23 DIAGNOSIS — E781 Pure hyperglyceridemia: Secondary | ICD-10-CM

## 2021-09-23 DIAGNOSIS — I700 Atherosclerosis of aorta: Secondary | ICD-10-CM

## 2021-09-23 DIAGNOSIS — C3490 Malignant neoplasm of unspecified part of unspecified bronchus or lung: Secondary | ICD-10-CM

## 2021-09-23 DIAGNOSIS — G4700 Insomnia, unspecified: Secondary | ICD-10-CM

## 2021-09-23 DIAGNOSIS — R739 Hyperglycemia, unspecified: Secondary | ICD-10-CM | POA: Diagnosis not present

## 2021-09-23 DIAGNOSIS — F419 Anxiety disorder, unspecified: Secondary | ICD-10-CM

## 2021-09-23 DIAGNOSIS — Z0001 Encounter for general adult medical examination with abnormal findings: Secondary | ICD-10-CM

## 2021-09-23 DIAGNOSIS — E785 Hyperlipidemia, unspecified: Secondary | ICD-10-CM

## 2021-09-23 DIAGNOSIS — I10 Essential (primary) hypertension: Secondary | ICD-10-CM

## 2021-09-23 DIAGNOSIS — J439 Emphysema, unspecified: Secondary | ICD-10-CM

## 2021-09-23 DIAGNOSIS — N183 Chronic kidney disease, stage 3 unspecified: Secondary | ICD-10-CM

## 2021-09-23 LAB — CBC
HCT: 40.6 % (ref 39.0–52.0)
Hemoglobin: 14 g/dL (ref 13.0–17.0)
MCHC: 34.4 g/dL (ref 30.0–36.0)
MCV: 95.2 fl (ref 78.0–100.0)
Platelets: 291 10*3/uL (ref 150.0–400.0)
RBC: 4.27 Mil/uL (ref 4.22–5.81)
RDW: 14 % (ref 11.5–15.5)
WBC: 6.8 10*3/uL (ref 4.0–10.5)

## 2021-09-23 LAB — HEMOGLOBIN A1C: Hgb A1c MFr Bld: 5.3 % (ref 4.6–6.5)

## 2021-09-23 LAB — TSH: TSH: 2.09 u[IU]/mL (ref 0.35–5.50)

## 2021-09-23 MED ORDER — QUETIAPINE FUMARATE 50 MG PO TABS: 50.0000 mg | ORAL_TABLET | Freq: Every day | ORAL | 3 refills | Status: DC

## 2021-09-23 MED ORDER — ALPRAZOLAM 0.5 MG PO TABS: ORAL_TABLET | ORAL | 5 refills | Status: DC

## 2021-09-23 MED ORDER — PRAVASTATIN SODIUM 80 MG PO TABS: 80.0000 mg | ORAL_TABLET | Freq: Every day | ORAL | 1 refills | Status: DC

## 2021-09-23 NOTE — Assessment & Plan Note (Signed)
Not controlled.  He is currently on Remeron 45 mg nightly, gabapentin 200 mg daily, and Xanax as needed.  We discussed potential treatment options.  Will start low-dose Seroquel 50 mg daily.  He will follow-up me in a few weeks and we can adjust the dose as tolerated.  May consider addition of trazodone or doxepin in the future if not having any improvement with Seroquel.  Could also consider increasing his dose of gabapentin.

## 2021-09-23 NOTE — Assessment & Plan Note (Signed)
Isolated systolic hypertension.  He has typically been well-controlled.  Will continue his continue medication regimen hydralazine 10 mg twice daily, amlodipine 10 mg daily, and lisinopril 40 mg daily.  He will continue to monitor at home.

## 2021-09-23 NOTE — Patient Instructions (Signed)
It was very nice to see you today!  We will check blood work today.  We will start Seroquel to help you sleep.  Please do this in combination with your other medications.  Please send a message in a few weeks to let me know how this is working for you.  See back in year for your next physical.  Please come back sooner if needed.  Take care, Dr Jerline Pain  PLEASE NOTE:  If you had any lab tests please let us know if you have not heard back within a few days. You may see your results on mychart before we have a chance to review them but we will give you a call once they are reviewed by [place]. If we ordered any referrals today, please let us know if you have not heard from their office within the next week.   Please try these tips to maintain a healthy lifestyle:  Eat at least 3 REAL meals and 1-2 snacks per day.  Aim for no more than 5 hours between eating.  If you eat breakfast, please do so within one hour of getting up.   Each meal should contain half fruits/vegetables, one quarter protein, and one quarter carbs (no bigger than a computer mouse)  Cut down on sweet beverages. This includes juice, soda, and sweet tea.   Drink at least 1 glass of water with each meal and aim for at least 8 glasses per day  Exercise at least 150 minutes every week.    Preventive Care 64 Years and Older, Male Preventive care refers to lifestyle choices and visits with your health care provider that can promote health and wellness. Preventive care visits are also called wellness exams. What can I expect for my preventive care visit? Counseling During your preventive care visit, your health care provider may ask about your: Medical history, including: Past medical problems. Family medical history. History of falls. Current health, including: Emotional well-being. Home life and relationship well-being. Sexual activity. Memory and ability to understand (cognition). Lifestyle, including: Alcohol, nicotine  or tobacco, and drug use. Access to firearms. Diet, exercise, and sleep habits. Work and work Statistician. Sunscreen use. Safety issues such as seatbelt and bike helmet use. Physical exam Your health care provider will check your: Height and weight. These may be used to calculate your BMI (body mass index). BMI is a measurement that tells if you are at a healthy weight. Waist circumference. This measures the distance around your waistline. This measurement also tells if you are at a healthy weight and may help predict your risk of certain diseases, such as type 2 diabetes and high blood pressure. Heart rate and blood pressure. Body temperature. Skin for abnormal spots. What immunizations do I need?  Vaccines are usually given at various ages, according to a schedule. Your health care provider will recommend vaccines for you based on your age, medical history, and lifestyle or other factors, such as travel or where you work. What tests do I need? Screening Your health care provider may recommend screening tests for certain conditions. This may include: Lipid and cholesterol levels. Diabetes screening. This is done by checking your blood sugar (glucose) after you have not eaten for a while (fasting). Hepatitis C test. Hepatitis B test. HIV (human immunodeficiency virus) test. STI (sexually transmitted infection) testing, if you are at risk. Lung cancer screening. Colorectal cancer screening. Prostate cancer screening. Abdominal aortic aneurysm (AAA) screening. You may need this if you are a current or former  smoker. Talk with your health care provider about your test results, treatment options, and if necessary, the need for more tests. Follow these instructions at home: Eating and drinking  Eat a diet that includes fresh fruits and vegetables, whole grains, lean protein, and low-fat dairy products. Limit your intake of foods with high amounts of sugar, saturated fats, and salt. Take  vitamin and mineral supplements as recommended by your health care provider. Do not drink alcohol if your health care provider tells you not to drink. If you drink alcohol: Limit how much you have to 0-2 drinks a day. Know how much alcohol is in your drink. In the [place] one drink equals one 12 oz bottle of beer (355 mL), one 5 oz glass of wine (148 mL), or one 1 oz glass of hard liquor (44 mL). Lifestyle Brush your teeth every morning and night with fluoride toothpaste. Floss one time each day. Exercise for at least 30 minutes 5 or more days each week. Do not use any products that contain nicotine or tobacco. These products include cigarettes, chewing tobacco, and vaping devices, such as e-cigarettes. If you need help quitting, ask your health care provider. Do not use drugs. If you are sexually active, practice safe sex. Use a condom or other form of protection to prevent STIs. Take aspirin only as told by your health care provider. Make sure that you understand how much to take and what form to take. Work with your health care provider to find out whether it is safe and beneficial for you to take aspirin daily. Ask your health care provider if you need to take a cholesterol-lowering medicine (statin). Find healthy ways to manage stress, such as: Meditation, yoga, or listening to music. Journaling. Talking to a trusted person. Spending time with friends and family. Safety Always wear your seat belt while driving or riding in a vehicle. Do not drive: If you have been drinking alcohol. Do not ride with someone who has been drinking. When you are tired or distracted. While texting. If you have been using any mind-altering substances or drugs. Wear a helmet and other protective equipment during sports activities. If you have firearms in your house, make sure you follow all gun safety procedures. Minimize exposure to UV radiation to reduce your risk of skin cancer. What's next? Visit your  health care provider once a year for an annual wellness visit. Ask your health care provider how often you should have your eyes and teeth checked. Stay up to date on all vaccines. This information is not intended to replace advice given to you by your health care provider. Make sure you discuss any questions you have with your health care provider. Document Revised: 08/01/2020 Document Reviewed: 08/01/2020 Elsevier Patient Education  [employer].

## 2021-09-23 NOTE — Progress Notes (Signed)
Chief Complaint:  [name] [name] is a 87 y.o. male who presents today for his annual comprehensive physical exam.    Assessment/Plan:  Chronic Problems Addressed Today: Essential hypertension Isolated systolic hypertension.  He has typically been well-controlled.  Will continue his continue medication regimen hydralazine 10 mg twice daily, amlodipine 10 mg daily, and lisinopril 40 mg daily.  He will continue to monitor at home.  HLD (hyperlipidemia) Check lipids.  Continue pravastatin 80 mg daily.  Insomnia Not controlled.  He is currently on Remeron 45 mg nightly, gabapentin 200 mg daily, and Xanax as needed.  We discussed potential treatment options.  Will start low-dose Seroquel 50 mg daily.  He will follow-up me in a few weeks and we can adjust the dose as tolerated.  May consider addition of trazodone or doxepin in the future if not having any improvement with Seroquel.  Could also consider increasing his dose of gabapentin.  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  Aortic atherosclerosis (HCC) Stable on aspirin and statin.  Anxiety Stable on Xanax as needed.  CKD (chronic kidney disease) stage 3, GFR 30-59 ml/min (HCC) Check labs.   Preventative Healthcare: Check labs  Patient Counseling(The following topics were reviewed and/or handout was given):  -Nutrition: Stressed importance of moderation in sodium/caffeine intake, saturated fat and cholesterol, caloric balance, sufficient intake of fresh fruits, vegetables, and fiber.  -Stressed the importance of regular exercise.   -Substance Abuse: Discussed cessation/primary prevention of tobacco, alcohol, or other drug use; driving or other dangerous activities under the influence; availability of treatment for abuse.   -Injury prevention: Discussed safety belts, safety helmets, smoke detector, smoking near bedding or upholstery.   -Sexuality: Discussed sexually transmitted diseases, partner selection, use of condoms, avoidance of  unintended pregnancy and contraceptive alternatives.   -Dental health: Discussed importance of regular tooth brushing, flossing, and dental visits.  -Health maintenance and immunizations reviewed. Please refer to Health maintenance section.  Return to care in 1 year for next preventative visit.     Subjective:  HPI:  He has no acute complaints today. See A/p for status of chronic conditions.      09/23/2021   10:39 AM  Depression screen PHQ 2/9  Decreased Interest 0  Down, Depressed, Hopeless 0  PHQ - 2 Score 0    Health Maintenance Due  Topic Date Due   Zoster Vaccines- Shingrix (1 of 2) Never done     ROS: Per HPI, otherwise a complete review of systems was negative.   PMH:  The following were reviewed and entered/updated in epic: Past Medical History:  Diagnosis Date   Anxiety 10/10/2016   Aortic atherosclerosis ([place]) 05/05/2017   Cancer of left lung parenchyma (HCC) 04/23/2008   Chronic hyponatremia 05/05/2017   CKD (chronic kidney disease) stage 3, GFR 30-59 ml/min (HCC) 10/10/2016   COPD with emphysema ([place]) 05/05/2017   Coronary atherosclerosis 03/13/2006   Essential hypertension 03/18/2006   HLD (hyperlipidemia) 04/23/2008   Squamous cell lung cancer ([place]) 2006   Patient Active Problem List   Diagnosis Date Noted   Osteoarthritis 03/04/2021   Bruit 10/10/2020   Insomnia 06/26/2020   Chronic pain of right knee 07/13/2019   Coronary artery disease involving native coronary artery of native heart without angina pectoris 09/15/2018   Dyslipidemia 09/15/2018   Chronic hyponatremia 05/05/2017   Aortic atherosclerosis ([place]) 05/05/2017   COPD with emphysema ([place]) 05/05/2017   CKD (chronic kidney disease) stage 3, GFR 30-59 ml/min (HCC) 10/10/2016   Alcohol consumption of  more than two drinks per day 10/10/2016   Anxiety 10/10/2016   Cancer of left lung parenchyma ([place]) 04/23/2008   HLD (hyperlipidemia) 04/23/2008   Essential hypertension 03/18/2006   Coronary  atherosclerosis 03/13/2006   Past Surgical History:  Procedure Laterality Date   CATARACT EXTRACTION, BILATERAL  2013   CORONARY STENT PLACEMENT     PNEUMONECTOMY      Family History  Problem Relation Age of Onset   Heart attack Mother 85   Heart attack Father 12   Hyperlipidemia Brother     Medications- reviewed and updated Current Outpatient Medications  Medication Sig Dispense Refill   amLODipine (NORVASC) 10 MG tablet TAKE 1 TABLET BY MOUTH EVERY DAY 90 tablet 1   aspirin 81 MG tablet Take 81 mg by mouth daily.     gabapentin (NEURONTIN) 100 MG capsule Take 2 capsules (200 mg total) by mouth at bedtime. 90 capsule 3   hydrALAZINE (APRESOLINE) 10 MG tablet Take 1 tablet (10 mg total) by mouth 2 (two) times daily. 180 tablet 3   lisinopril (ZESTRIL) 40 MG tablet TAKE 1 TABLET BY MOUTH EVERY DAY 90 tablet 3   mirtazapine (REMERON) 45 MG tablet TAKE 1 TABLET BY MOUTH EVERYDAY AT BEDTIME 90 tablet 0   nitroGLYCERIN (NITROSTAT) 0.4 MG SL tablet Place 1 tablet (0.4 mg total) under the tongue every 5 (five) minutes as needed for chest pain. 25 tablet 3   QUEtiapine (SEROQUEL) 50 MG tablet Take 1 tablet (50 mg total) by mouth at bedtime. 90 tablet 3   ALPRAZolam (XANAX) 0.5 MG tablet TAKE 1 TABLET BY MOUTH AT BEDTIME AS NEEDED FOR ANXIETY 30 tablet 5   pravastatin (PRAVACHOL) 80 MG tablet Take 1 tablet (80 mg total) by mouth daily. 90 tablet 1   No current facility-administered medications for this visit.    Allergies-reviewed and updated No Known Allergies  Social History   Socioeconomic History   Marital status: Widowed    Spouse name: Not on file   Number of children: Not on file   Years of education: Not on file   Highest education level: Not on file  Occupational History   Occupation: RETIRED  Tobacco Use   Smoking status: Former    Packs/day: 1.50    Years: 20.00    Total pack years: 30.00    Types: Cigarettes    Quit date: 02/18/1996    Years since quitting: 25.6    Smokeless tobacco: Never  Vaping Use   Vaping Use: Never used  Substance and Sexual Activity   Alcohol use: Yes    Alcohol/week: 14.0 standard drinks of alcohol    Types: 14 Cans of beer per week    Comment: more when on vacation   Drug use: No   Sexual activity: Not on file  Other Topics Concern   Not on file  Social History Narrative   Not on file   Social Determinants of Health   Financial Resource Strain: Not on file  Food Insecurity: Not on file  Transportation Needs: Not on file  Physical Activity: Not on file  Stress: Not on file  Social Connections: Not on file        Objective:  Physical Exam: BP (!) 176/58   Pulse 65   Temp 97.8 F (36.6 C) (Temporal)   Ht 5\' 8"  (1.727 m)   Wt 159 lb 9.6 oz (72.4 kg)   SpO2 99%   BMI 24.27 kg/m   Body mass index is 24.27 kg/m.  Wt Readings from Last 3 Encounters:  09/23/21 159 lb 9.6 oz (72.4 kg)  07/19/21 159 lb 6.4 oz (72.3 kg)  03/04/21 172 lb 8 oz (78.2 kg)   Gen: NAD, resting comfortably HEENT: TMs normal bilaterally. OP clear. No thyromegaly noted.  CV: RRR with no murmurs appreciated Pulm: NWOB, CTAB with no crackles, wheezes, or rhonchi GI: Normal bowel sounds present. Soft, Nontender, Nondistended. MSK: no edema, cyanosis, or clubbing noted Skin: warm, dry Neuro: CN2-12 grossly intact. Strength 5/5 in upper and lower extremities. Reflexes symmetric and intact bilaterally.  Psych: Normal affect and thought content     Trace Wirick M. Jerline Pain, MD 09/23/2021 11:07 AM

## 2021-09-23 NOTE — Assessment & Plan Note (Signed)
Check lipids.  Continue pravastatin 80 mg daily.

## 2021-09-23 NOTE — Assessment & Plan Note (Signed)
Stable

## 2021-09-23 NOTE — Assessment & Plan Note (Signed)
Stable on Xanax as needed.

## 2021-09-23 NOTE — Assessment & Plan Note (Signed)
Stable on aspirin and statin.

## 2021-09-23 NOTE — Assessment & Plan Note (Signed)
Check labs 

## 2021-09-24 LAB — COMPREHENSIVE METABOLIC PANEL
ALT: 10 U/L (ref 0–53)
AST: 23 U/L (ref 0–37)
Albumin: 4.4 g/dL (ref 3.5–5.2)
Alkaline Phosphatase: 44 U/L (ref 39–117)
BUN: 19 mg/dL (ref 6–23)
CO2: 22 mEq/L (ref 19–32)
Calcium: 9.9 mg/dL (ref 8.4–10.5)
Chloride: 95 mEq/L — ABNORMAL LOW (ref 96–112)
Creatinine, Ser: 1.34 mg/dL (ref 0.40–1.50)
GFR: 48.42 mL/min — ABNORMAL LOW (ref >60.00)
Glucose, Bld: 94 mg/dL (ref 70–99)
Potassium: 4.6 mEq/L (ref 3.5–5.1)
Sodium: 130 mEq/L — ABNORMAL LOW (ref 135–145)
Total Bilirubin: 0.5 mg/dL (ref 0.2–1.2)
Total Protein: 7.8 g/dL (ref 6.0–8.3)

## 2021-09-24 LAB — LIPID PANEL
Cholesterol: 190 mg/dL (ref 0–200)
HDL: 96.6 mg/dL (ref >39.00)
LDL Cholesterol: 84 mg/dL (ref 0–99)
NonHDL: 93.3
Total CHOL/HDL Ratio: 2
Triglycerides: 49 mg/dL (ref 0.0–149.0)
VLDL: 9.8 mg/dL (ref 0.0–40.0)

## 2021-09-24 NOTE — Progress Notes (Signed)
Please inform patient of the following:  Labs are all stable.  Do not need to make any changes to his treatment plan at this time.  He should continue his current medications.  We can recheck again in about 6 months.

## 2021-10-08 ENCOUNTER — Telehealth: Payer: Self-pay | Admitting: Family Medicine

## 2021-10-08 NOTE — Telephone Encounter (Signed)
Patient's daughter [name] returned call. Requests to be called at ph# [phone]

## 2021-10-09 NOTE — Telephone Encounter (Signed)
See results note. 

## 2021-10-11 ENCOUNTER — Ambulatory Visit (HOSPITAL_COMMUNITY)
Admission: RE | Admit: 2021-10-11 | Discharge: 2021-10-11 | Disposition: A | Discharge status: Home / Self Care | Payer: Medicare HMO | Source: Ambulatory Visit | Attending: Cardiology | Admitting: Cardiology

## 2021-10-11 DIAGNOSIS — I6523 Occlusion and stenosis of bilateral carotid arteries: Secondary | ICD-10-CM | POA: Diagnosis present

## 2021-10-16 ENCOUNTER — Encounter: Payer: Self-pay | Admitting: *Deleted

## 2021-10-25 ENCOUNTER — Other Ambulatory Visit: Payer: Self-pay | Admitting: Family Medicine

## 2021-10-25 DIAGNOSIS — I10 Essential (primary) hypertension: Secondary | ICD-10-CM

## 2021-11-05 ENCOUNTER — Other Ambulatory Visit: Payer: Self-pay | Admitting: Family Medicine

## 2021-11-05 ENCOUNTER — Other Ambulatory Visit: Payer: Self-pay | Admitting: Cardiology

## 2021-12-16 ENCOUNTER — Ambulatory Visit (INDEPENDENT_AMBULATORY_CARE_PROVIDER_SITE_OTHER): Payer: Medicare HMO

## 2021-12-16 DIAGNOSIS — Z0000 Encounter for general adult medical examination without abnormal findings: Secondary | ICD-10-CM | POA: Diagnosis not present

## 2021-12-16 NOTE — Patient Instructions (Signed)
Mr. [name] [name] , Thank you for taking time to come for your Medicare Wellness Visit. I appreciate your ongoing commitment to your health goals. Please review the following plan we discussed and let me know if I can assist you in the future.   These are the goals we discussed:  Goals      Maintain current health status     Patient Stated     Maintain health         This is a list of the screening recommended for you and due dates:  Health Maintenance  Topic Date Due   COVID-19 Vaccine (1) Never done   Zoster (Shingles) Vaccine (1 of 2) Never done   Flu Shot  05/18/2022*   Medicare Annual Wellness Visit  12/17/2022   Tetanus Vaccine  05/06/2027   Pneumonia Vaccine  Completed   HPV Vaccine  Aged Out  *Topic was postponed. The date shown is not the original due date.    Advanced directives: Please bring a copy of your health care power of attorney and living will to the office at your convenience.  Conditions/risks identified: maintain health   Next appointment: Follow up in one year for your annual wellness visit.   Preventive Care 85 Years and Older, Male  Preventive care refers to lifestyle choices and visits with your health care provider that can promote health and wellness. What does preventive care include? A yearly physical exam. This is also called an annual well check. Dental exams once or twice a year. Routine eye exams. Ask your health care provider how often you should have your eyes checked. Personal lifestyle choices, including: Daily care of your teeth and gums. Regular physical activity. Eating a healthy diet. Avoiding tobacco and drug use. Limiting alcohol use. Practicing safe sex. Taking low doses of aspirin every day. Taking vitamin and mineral supplements as recommended by your health care provider. What happens during an annual well check? The services and screenings done by your health care provider during your annual well check will depend on your age,  overall health, lifestyle risk factors, and family history of disease. Counseling  Your health care provider may ask you questions about your: Alcohol use. Tobacco use. Drug use. Emotional well-being. Home and relationship well-being. Sexual activity. Eating habits. History of falls. Memory and ability to understand (cognition). Work and work Statistician. Screening  You may have the following tests or measurements: Height, weight, and BMI. Blood pressure. Lipid and cholesterol levels. These may be checked every 5 years, or more frequently if you are over 61 years old. Skin check. Lung cancer screening. You may have this screening every year starting at age 85 if you have a 30-pack-year history of smoking and currently smoke or have quit within the past 15 years. Fecal occult blood test (FOBT) of the stool. You may have this test every year starting at age 85. Flexible sigmoidoscopy or colonoscopy. You may have a sigmoidoscopy every 5 years or a colonoscopy every 10 years starting at age 85. Prostate cancer screening. Recommendations will vary depending on your family history and other risks. Hepatitis C blood test. Hepatitis B blood test. Sexually transmitted disease (STD) testing. Diabetes screening. This is done by checking your blood sugar (glucose) after you have not eaten for a while (fasting). You may have this done every 1-3 years. Abdominal aortic aneurysm (AAA) screening. You may need this if you are a current or former smoker. Osteoporosis. You may be screened starting at age 85 if  you are at high risk. Talk with your health care provider about your test results, treatment options, and if necessary, the need for more tests. Vaccines  Your health care provider may recommend certain vaccines, such as: Influenza vaccine. This is recommended every year. Tetanus, diphtheria, and acellular pertussis (Tdap, Td) vaccine. You may need a Td booster every 10 years. Zoster vaccine.  You may need this after age 85. Pneumococcal 13-valent conjugate (PCV13) vaccine. One dose is recommended after age 85. Pneumococcal polysaccharide (PPSV23) vaccine. One dose is recommended after age 85. Talk to your health care provider about which screenings and vaccines you need and how often you need them. This information is not intended to replace advice given to you by your health care provider. Make sure you discuss any questions you have with your health care provider. Document Released: 03/02/2015 Document Revised: 10/24/2015 Document Reviewed: 12/05/2014 Elsevier Interactive Patient Education  2017 [place] Prevention in the Home Falls can cause injuries. They can happen to people of all ages. There are many things you can do to make your home safe and to help prevent falls. What can I do on the outside of my home? Regularly fix the edges of walkways and driveways and fix any cracks. Remove anything that might make you trip as you walk through a door, such as a raised step or threshold. Trim any bushes or trees on the path to your home. Use bright outdoor lighting. Clear any walking paths of anything that might make someone trip, such as rocks or tools. Regularly check to see if handrails are loose or broken. Make sure that both sides of any steps have handrails. Any raised decks and porches should have guardrails on the edges. Have any leaves, snow, or ice cleared regularly. Use sand or salt on walking paths during winter. Clean up any spills in your garage right away. This includes oil or grease spills. What can I do in the bathroom? Use night lights. Install grab bars by the toilet and in the tub and shower. Do not use towel bars as grab bars. Use non-skid mats or decals in the tub or shower. If you need to sit down in the shower, use a plastic, non-slip stool. Keep the floor dry. Clean up any water that spills on the floor as soon as it happens. Remove soap  buildup in the tub or shower regularly. Attach bath mats securely with double-sided non-slip rug tape. Do not have throw rugs and other things on the floor that can make you trip. What can I do in the bedroom? Use night lights. Make sure that you have a light by your bed that is easy to reach. Do not use any sheets or blankets that are too big for your bed. They should not hang down onto the floor. Have a firm chair that has side arms. You can use this for support while you get dressed. Do not have throw rugs and other things on the floor that can make you trip. What can I do in the kitchen? Clean up any spills right away. Avoid walking on wet floors. Keep items that you use a lot in easy-to-reach places. If you need to reach something above you, use a strong step stool that has a grab bar. Keep electrical cords out of the way. Do not use floor polish or wax that makes floors slippery. If you must use wax, use non-skid floor wax. Do not have throw rugs and other things on  the floor that can make you trip. What can I do with my stairs? Do not leave any items on the stairs. Make sure that there are handrails on both sides of the stairs and use them. Fix handrails that are broken or loose. Make sure that handrails are as long as the stairways. Check any carpeting to make sure that it is firmly attached to the stairs. Fix any carpet that is loose or worn. Avoid having throw rugs at the top or bottom of the stairs. If you do have throw rugs, attach them to the floor with carpet tape. Make sure that you have a light switch at the top of the stairs and the bottom of the stairs. If you do not have them, ask someone to add them for you. What else can I do to help prevent falls? Wear shoes that: Do not have high heels. Have rubber bottoms. Are comfortable and fit you well. Are closed at the toe. Do not wear sandals. If you use a stepladder: Make sure that it is fully opened. Do not climb a closed  stepladder. Make sure that both sides of the stepladder are locked into place. Ask someone to hold it for you, if possible. Clearly mark and make sure that you can see: Any grab bars or handrails. First and last steps. Where the edge of each step is. Use tools that help you move around (mobility aids) if they are needed. These include: Canes. Walkers. Scooters. Crutches. Turn on the lights when you go into a dark area. Replace any light bulbs as soon as they burn out. Set up your furniture so you have a clear path. Avoid moving your furniture around. If any of your floors are uneven, fix them. If there are any pets around you, be aware of where they are. Review your medicines with your doctor. Some medicines can make you feel dizzy. This can increase your chance of falling. Ask your doctor what other things that you can do to help prevent falls. This information is not intended to replace advice given to you by your health care provider. Make sure you discuss any questions you have with your health care provider. Document Released: 11/30/2008 Document Revised: 07/12/2015 Document Reviewed: 03/10/2014 Elsevier Interactive Patient Education  2017 [employer].

## 2021-12-16 NOTE — Progress Notes (Signed)
I connected with  [name] [name] on 12/16/21 by a audio enabled telemedicine application and verified that I am speaking with the correct person using two identifiers.  Patient Location: Home  Provider Location: Office/Clinic  I discussed the limitations of evaluation and management by telemedicine. The patient expressed understanding and agreed to proceed.along with  Daughter [name] [name].    Subjective:   [name] [name] is a 87 y.o. male who presents for Medicare Annual/Subsequent preventive examination.  Review of Systems     Cardiac Risk Factors include: advanced age (>88men, >31 women);dyslipidemia;hypertension;male gender     Objective:    Today's Vitals   There is no height or weight on file to calculate BMI.     12/16/2021   11:44 AM 12/03/2016   10:15 AM 06/09/2016   12:57 PM 10/04/2015    9:34 AM  Advanced Directives  Does Patient Have a Medical Advance Directive? Yes No Yes No  Type of Paramedic of [place];Living will  [place];Living will   Copy of [place] in Chart? No - copy requested  No - copy requested   Would patient like information on creating a medical advance directive?  Yes (MAU/Ambulatory/Procedural Areas - Information given)  No - patient declined information    Current Medications (verified) Outpatient Encounter Medications as of 12/16/2021  Medication Sig   ALPRAZolam (XANAX) 0.5 MG tablet TAKE 1 TABLET BY MOUTH AT BEDTIME AS NEEDED FOR ANXIETY   amLODipine (NORVASC) 10 MG tablet TAKE 1 TABLET BY MOUTH EVERY DAY   aspirin 81 MG tablet Take 81 mg by mouth daily.   gabapentin (NEURONTIN) 100 MG capsule TAKE 2 CAPSULES BY MOUTH AT BEDTIME.   hydrALAZINE (APRESOLINE) 10 MG tablet TAKE 1 TABLET BY MOUTH TWICE A DAY   lisinopril (ZESTRIL) 40 MG tablet TAKE 1 TABLET BY MOUTH EVERY DAY   mirtazapine (REMERON) 45 MG tablet TAKE 1 TABLET BY MOUTH EVERYDAY AT BEDTIME   pravastatin (PRAVACHOL)  80 MG tablet Take 1 tablet (80 mg total) by mouth daily.   QUEtiapine (SEROQUEL) 50 MG tablet Take 1 tablet (50 mg total) by mouth at bedtime.   nitroGLYCERIN (NITROSTAT) 0.4 MG SL tablet Place 1 tablet (0.4 mg total) under the tongue every 5 (five) minutes as needed for chest pain.   No facility-administered encounter medications on file as of 12/16/2021.    Allergies (verified) Patient has no known allergies.   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/05/2017   Coronary atherosclerosis 03/13/2006   Essential hypertension 03/18/2006   HLD (hyperlipidemia) 04/23/2008   Squamous cell lung cancer ([place]) 2006   Past Surgical History:  Procedure Laterality Date   CATARACT EXTRACTION, BILATERAL  2013   CORONARY STENT PLACEMENT     PNEUMONECTOMY     Family History  Problem Relation Age of Onset   Heart attack Mother 55   Heart attack Father 34   Hyperlipidemia Brother    Social History   Socioeconomic History   Marital status: Widowed    Spouse name: Not on file   Number of children: Not on file   Years of education: Not on file   Highest education level: Not on file  Occupational History  Occupation: RETIRED  Tobacco Use   Smoking status: Former    Packs/day: 1.50    Years: 20.00    Total pack years: 30.00    Types: Cigarettes    Quit date: 02/18/1996    Years since quitting: 25.8   Smokeless tobacco: Never  Vaping Use   Vaping Use: Never used  Substance and Sexual Activity   Alcohol use: Yes    Alcohol/week: 14.0 standard drinks of alcohol    Types: 14 Cans of beer per week    Comment: more when on vacation   Drug use: No   Sexual activity: Not on file  Other Topics Concern   Not on file  Social History  Narrative   Not on file   Social Determinants of Health   Financial Resource Strain: Low Risk  (12/16/2021)   Overall Financial Resource Strain (CARDIA)    Difficulty of Paying Living Expenses: Not hard at all  Food Insecurity: No Food Insecurity (12/16/2021)   Hunger Vital Sign    Worried About Running Out of Food in the Last Year: Never true    Ran Out of Food in the Last Year: Never true  Transportation Needs: No Transportation Needs (12/16/2021)   PRAPARE - Hydrologist (Medical): No    Lack of Transportation (Non-Medical): No  Physical Activity: Sufficiently Active (12/16/2021)   Exercise Vital Sign    Days of Exercise per Week: 5 days    Minutes of Exercise per Session: 30 min  Stress: No Stress Concern Present (12/16/2021)   [place]    Feeling of Stress : Not at all  Social Connections: Socially Isolated (12/16/2021)   Social Connection and Isolation Panel [NHANES]    Frequency of Communication with Friends and Family: Twice a week    Frequency of Social Gatherings with Friends and Family: More than three times a week    Attends Religious Services: Never    Marine scientist or Organizations: No    Attends Archivist Meetings: Never    Marital Status: Widowed    Tobacco Counseling Counseling given: Not Answered   Clinical Intake:  Pre-visit preparation completed: Yes  Pain : No/denies pain     Nutritional Risks: None Diabetes: No  How often do you need to have someone help you when you read instructions, pamphlets, or other written materials from your doctor or pharmacy?: 1 - Never  Diabetic?no  Interpreter Needed?: No  Information entered by :: Charlott Rakes, LPN   Activities of Daily Living    12/16/2021   11:46 AM  In your present state of health, do you have any difficulty performing the following activities:  Hearing? 0  Vision? 0   Difficulty concentrating or making decisions? 0  Walking or climbing stairs? 0  Comment take time going up  Dressing or bathing? 0  Doing errands, shopping? 0  Preparing Food and eating ? N  Using the Toilet? N  In the past six months, have you accidently leaked urine? N  Do you have problems with loss of bowel control? N  Managing your Medications? N  Managing your Finances? N  Housekeeping or managing your Housekeeping? N    Patient Care Team: Vivi Barrack, MD as PCP - General (Family Medicine) Minus Breeding, MD as PCP - Cardiology (Cardiology) Minus Breeding, MD as Consulting Physician (Cardiology) Delice Bison Charlestine Massed, NP as Nurse Practitioner (Hematology and Oncology) Jari Pigg, MD  as Consulting Physician (Dermatology) Deterding, Jeneen Rinks, MD as Consulting Physician (Nephrology)  Indicate any recent Medical Services you may have received from other than [facility] providers in the past year (date may be approximate).     Assessment:   This is a routine wellness examination for [place].  Hearing/Vision screen Hearing Screening - Comments:: Pt stated no hearing issues  Vision Screening - Comments:: Pt follows [facility] with [facility] for [facility] exams   Dietary issues and exercise activities discussed: Current Exercise Habits: Home exercise routine, Type of exercise: walking;Other - see comments, Time (Minutes): 30, Frequency (Times/Week): 5, Weekly Exercise (Minutes/Week): 150   Goals Addressed             This Visit's Progress    Patient Stated       Maintain health        Depression Screen    12/16/2021   11:42 AM 09/23/2021   10:39 AM 06/26/2020   11:09 AM 04/07/2018    9:54 AM 12/11/2017    8:50 AM 12/03/2016   10:19 AM 10/07/2016   11:29 AM  PHQ 2/9 Scores  PHQ - 2 Score 0 0 0 0 0 0 0  PHQ- 9 Score   5 1       Fall Risk    12/16/2021   11:45 AM 09/23/2021   10:39 AM 06/26/2020   10:37 AM 06/26/2020   10:35 AM 12/11/2017    8:50 AM  Fall Risk    Falls in the past year? 0 0 0 0 No  Number falls in past yr: 0 0 0    Injury with Fall? 0 0     Risk for fall due to : Impaired mobility No Fall Risks     Risk for fall due to: Comment bone on bone uses a cane      Follow up Falls prevention discussed        FALL RISK PREVENTION PERTAINING TO THE HOME:  Any stairs in or around the home? Yes  If so, are there any without handrails? No  Home free of loose throw rugs in walkways, pet beds, electrical cords, etc? Yes  Adequate lighting in your home to reduce risk of falls? Yes   ASSISTIVE DEVICES UTILIZED TO PREVENT FALLS:  Life alert? No  Use of a cane, walker or w/c? Yes  Grab bars in the bathroom? No  Shower chair or bench in shower? No  Elevated toilet seat or a handicapped toilet? No   TIMED UP AND GO:  Was the test performed? No .   Cognitive Function:    12/03/2016   10:19 AM  MMSE - Mini Mental State Exam  Not completed: Unable to complete  Attention/Calculation-comments Cannot spell        12/16/2021   11:47 AM  6CIT Screen  What Year? 0 points  What month? 0 points  What time? 0 points  Count back from 20 0 points  Months in reverse 4 points  Repeat phrase 6 points  Total Score 10 points    Immunizations Immunization History  Administered Date(s) Administered   Fluad Quad(high Dose 65+) 11/01/2018   Influenza Whole 02/08/2009, 11/26/2009   Influenza, High Dose Seasonal PF 10/07/2016, 11/11/2017   Influenza,inj,Quad PF,6+ Mos 12/02/2013   Influenza-Unspecified 10/07/2016   Pneumococcal Conjugate-13 12/03/2016, 10/03/2017   Pneumococcal Polysaccharide-23 11/01/2018, 06/26/2020   Tdap 05/05/2017    TDAP status: Up to date  Flu Vaccine status: Due, Education has been provided regarding the importance  of this vaccine. Advised may receive this vaccine at local pharmacy or Health Dept. Aware to provide a copy of the vaccination record if obtained from local pharmacy or Health Dept. Verbalized  acceptance and understanding.  Pneumococcal vaccine status: Up to date  Covid-19 vaccine status: Declined, Education has been provided regarding the importance of this vaccine but patient still declined. Advised may receive this vaccine at local pharmacy or Health Dept.or vaccine clinic. Aware to provide a copy of the vaccination record if obtained from local pharmacy or Health Dept. Verbalized acceptance and understanding.  Qualifies for Shingles Vaccine? Yes   Zostavax completed No   Shingrix Completed?: No.    Education has been provided regarding the importance of this vaccine. Patient has been advised to call insurance company to determine out of pocket expense if they have not yet received this vaccine. Advised may also receive vaccine at local pharmacy or Health Dept. Verbalized acceptance and understanding.  Screening Tests Health Maintenance  Topic Date Due   COVID-19 Vaccine (1) Never done   Zoster Vaccines- Shingrix (1 of 2) Never done   INFLUENZA VACCINE  05/18/2022 (Originally 09/17/2021)   Medicare Annual Wellness (AWV)  12/17/2022   TETANUS/TDAP  05/06/2027   Pneumonia Vaccine 32+ Years old  Completed   HPV VACCINES  Aged Out    Health Maintenance  Health Maintenance Due  Topic Date Due   COVID-19 Vaccine (1) Never done   Zoster Vaccines- Shingrix (1 of 2) Never done    Colorectal cancer screening: No longer required.    Additional Screening:   Vision Screening: Recommended annual ophthalmology exams for early detection of glaucoma and other disorders of the eye. Is the patient up to date with their annual eye exam?  Yes  Who is the provider or what is the name of the office in which the patient attends annual eye exams? [facility]  If pt is not established with a provider, would they like to be referred to a provider to establish care? No .   Dental Screening: Recommended annual dental exams for proper oral hygiene  Community Resource Referral / Chronic Care  Management: CRR required this visit?  No   CCM required this visit?  No      Plan:     I have personally reviewed and noted the following in the patient's chart:   Medical and social history Use of alcohol, tobacco or illicit drugs  Current medications and supplements including opioid prescriptions. Patient is not currently taking opioid prescriptions. Functional ability and status Nutritional status Physical activity Advanced directives List of other physicians Hospitalizations, surgeries, and ER visits in previous 12 months Vitals Screenings to include cognitive, depression, and falls Referrals and appointments  In addition, I have reviewed and discussed with patient certain preventive protocols, quality metrics, and best practice recommendations. A written personalized care plan for preventive services as well as general preventive health recommendations were provided to patient.     Willette Brace, LPN   [id number]   Nurse Notes: none

## 2021-12-25 ENCOUNTER — Other Ambulatory Visit: Payer: Self-pay | Admitting: *Deleted

## 2021-12-25 MED ORDER — MIRTAZAPINE 45 MG PO TABS: ORAL_TABLET | ORAL | 0 refills | Status: DC

## 2022-01-13 ENCOUNTER — Other Ambulatory Visit (HOSPITAL_COMMUNITY): Payer: Self-pay | Admitting: Cardiology

## 2022-01-13 DIAGNOSIS — I6523 Occlusion and stenosis of bilateral carotid arteries: Secondary | ICD-10-CM

## 2022-01-26 ENCOUNTER — Other Ambulatory Visit: Payer: Self-pay | Admitting: Family Medicine

## 2022-02-23 ENCOUNTER — Other Ambulatory Visit: Payer: Self-pay | Admitting: Family Medicine

## 2022-02-23 DIAGNOSIS — E781 Pure hyperglyceridemia: Secondary | ICD-10-CM

## 2022-03-27 ENCOUNTER — Other Ambulatory Visit: Payer: Self-pay | Admitting: Family Medicine

## 2022-03-27 DIAGNOSIS — F419 Anxiety disorder, unspecified: Secondary | ICD-10-CM

## 2022-03-27 DIAGNOSIS — I10 Essential (primary) hypertension: Secondary | ICD-10-CM

## 2022-04-23 ENCOUNTER — Other Ambulatory Visit: Payer: Self-pay | Admitting: Family Medicine

## 2022-04-23 DIAGNOSIS — F419 Anxiety disorder, unspecified: Secondary | ICD-10-CM

## 2022-04-23 NOTE — Telephone Encounter (Signed)
Last refill: 03/27/22 #30, 0 Last OV: 09/23/21 dx. CPE

## 2022-05-24 ENCOUNTER — Other Ambulatory Visit: Payer: Self-pay | Admitting: Family Medicine

## 2022-05-24 DIAGNOSIS — F419 Anxiety disorder, unspecified: Secondary | ICD-10-CM

## 2022-05-26 NOTE — Telephone Encounter (Signed)
Last refill: 04/24/22 #30, 0 Last OV: 09/23/21 dx. CPE

## 2022-06-22 ENCOUNTER — Other Ambulatory Visit: Payer: Self-pay | Admitting: Family Medicine

## 2022-06-22 DIAGNOSIS — F419 Anxiety disorder, unspecified: Secondary | ICD-10-CM

## 2022-07-24 ENCOUNTER — Other Ambulatory Visit: Payer: Self-pay | Admitting: Family Medicine

## 2022-08-25 ENCOUNTER — Other Ambulatory Visit: Payer: Self-pay | Admitting: Family Medicine

## 2022-08-25 DIAGNOSIS — E781 Pure hyperglyceridemia: Secondary | ICD-10-CM

## 2022-09-09 ENCOUNTER — Telehealth: Payer: Self-pay | Admitting: *Deleted

## 2022-09-09 NOTE — Telephone Encounter (Signed)
I connected with Rosey Bath on behalf of [name] [name] on 7/23 at [phone] by telephone and verified that I am speaking with the correct person using two identifiers. According to the patient's chart they are due for follow up with [facility]. Pt scheduled. There are no transportation issues at this time. Nothing further was needed at the end of our conversation.

## 2022-09-24 ENCOUNTER — Other Ambulatory Visit: Payer: Self-pay | Admitting: Family Medicine

## 2022-09-24 DIAGNOSIS — I10 Essential (primary) hypertension: Secondary | ICD-10-CM

## 2022-09-29 ENCOUNTER — Ambulatory Visit (INDEPENDENT_AMBULATORY_CARE_PROVIDER_SITE_OTHER): Payer: Medicare HMO | Admitting: Family Medicine

## 2022-09-29 VITALS — BP 167/55 | HR 63 | Temp 98.0°F | Ht 68.0 in | Wt 161.6 lb

## 2022-09-29 DIAGNOSIS — M1990 Unspecified osteoarthritis, unspecified site: Secondary | ICD-10-CM

## 2022-09-29 DIAGNOSIS — R739 Hyperglycemia, unspecified: Secondary | ICD-10-CM | POA: Diagnosis not present

## 2022-09-29 DIAGNOSIS — E781 Pure hyperglyceridemia: Secondary | ICD-10-CM

## 2022-09-29 DIAGNOSIS — J430 Unilateral pulmonary emphysema [MacLeod's syndrome]: Secondary | ICD-10-CM

## 2022-09-29 DIAGNOSIS — I10 Essential (primary) hypertension: Secondary | ICD-10-CM | POA: Diagnosis not present

## 2022-09-29 DIAGNOSIS — N183 Chronic kidney disease, stage 3 unspecified: Secondary | ICD-10-CM

## 2022-09-29 DIAGNOSIS — I700 Atherosclerosis of aorta: Secondary | ICD-10-CM

## 2022-09-29 DIAGNOSIS — G4700 Insomnia, unspecified: Secondary | ICD-10-CM

## 2022-09-29 DIAGNOSIS — F419 Anxiety disorder, unspecified: Secondary | ICD-10-CM

## 2022-09-29 LAB — COMPREHENSIVE METABOLIC PANEL
ALT: 10 U/L (ref 0–53)
AST: 20 U/L (ref 0–37)
Albumin: 4.6 g/dL (ref 3.5–5.2)
Alkaline Phosphatase: 56 U/L (ref 39–117)
BUN: 18 mg/dL (ref 6–23)
CO2: 24 mEq/L (ref 19–32)
Calcium: 10.2 mg/dL (ref 8.4–10.5)
Chloride: 94 mEq/L — ABNORMAL LOW (ref 96–112)
Creatinine, Ser: 1.44 mg/dL (ref 0.40–1.50)
GFR: 44.1 mL/min — ABNORMAL LOW (ref >60.00)
Glucose, Bld: 106 mg/dL — ABNORMAL HIGH (ref 70–99)
Potassium: 4.8 mEq/L (ref 3.5–5.1)
Sodium: 127 mEq/L — ABNORMAL LOW (ref 135–145)
Total Bilirubin: 0.4 mg/dL (ref 0.2–1.2)
Total Protein: 8 g/dL (ref 6.0–8.3)

## 2022-09-29 LAB — CBC
HCT: 41.9 % (ref 39.0–52.0)
Hemoglobin: 14.1 g/dL (ref 13.0–17.0)
MCHC: 33.6 g/dL (ref 30.0–36.0)
MCV: 96.3 fl (ref 78.0–100.0)
Platelets: 343 10*3/uL (ref 150.0–400.0)
RBC: 4.35 Mil/uL (ref 4.22–5.81)
RDW: 13.8 % (ref 11.5–15.5)
WBC: 5.7 10*3/uL (ref 4.0–10.5)

## 2022-09-29 LAB — LIPID PANEL
Cholesterol: 182 mg/dL (ref 0–200)
HDL: 91.8 mg/dL (ref >39.00)
LDL Cholesterol: 81 mg/dL (ref 0–99)
NonHDL: 90.4
Total CHOL/HDL Ratio: 2
Triglycerides: 48 mg/dL (ref 0.0–149.0)
VLDL: 9.6 mg/dL (ref 0.0–40.0)

## 2022-09-29 LAB — HEMOGLOBIN A1C: Hgb A1c MFr Bld: 5.2 % (ref 4.6–6.5)

## 2022-09-29 LAB — TSH: TSH: 2.06 u[IU]/mL (ref 0.35–5.50)

## 2022-09-29 NOTE — Assessment & Plan Note (Signed)
Follows with orthopedics. 

## 2022-09-29 NOTE — Assessment & Plan Note (Signed)
Elevated today though was well controlled at recent cardiology visit.  He has typically been well-controlled.  We will continue current regimen hydralazine 10 mg twice daily, amlodipine 10 mg daily, and lisinopril 40 mg daily per cardiology.  They will continue to monitor at home and let us know if persistently elevated.

## 2022-09-29 NOTE — Progress Notes (Signed)
   [name] [name] is a 88 y.o. male who presents today for an office visit.  Assessment/Plan:  Chronic Problems Addressed Today: Essential hypertension Elevated today though was well controlled at recent cardiology visit.  He has typically been well-controlled.  We will continue current regimen hydralazine 10 mg twice daily, amlodipine 10 mg daily, and lisinopril 40 mg daily per cardiology.  They will continue to monitor at home and let us know if persistently elevated.  HLD (hyperlipidemia) Check lipids.  Continue pravastatin 80 mg daily.  CKD (chronic kidney disease) stage 3, GFR 30-59 ml/min (HCC) Check c-Met.  Aortic atherosclerosis (HCC) Stable on aspirin and statin.  Insomnia Still having some issues with sleep.  Currently has Remeron 45 mg daily, gabapentin 200 mg daily, Seroquel 50 mg nightly, and Xanax as needed on medication list.  Patient's daughter thinks that he is not taking the Remeron however she is not sure.  She thinks that he is taking the rest of his medications.  They will double check when he gets home and we will restart Remeron 15 mg nightly if needed.  Anxiety Stable on Xanax as needed.  Osteoarthritis Follows with orthopedics.     Subjective:  HPI:  See Assessment / plan for status of chronic conditions. He has no acute concerns today.  He has been following with orthopedics and cardiology since our last visit.  Arthritis pain is stable.  He is doing well with medications does not need any refills today.       Objective:  Physical Exam: BP (!) 167/55   Pulse 63   Temp 98 F (36.7 C) (Temporal)   Ht 5\' 8"  (1.727 m)   Wt 161 lb 9.6 oz (73.3 kg)   SpO2 99%   BMI 24.57 kg/m   Gen: No acute distress, resting comfortably CV: Regular rate and rhythm with no murmurs appreciated Pulm: Normal work of breathing, clear to auscultation bilaterally with no crackles, wheezes, or rhonchi Neuro: Grossly normal, moves all extremities Psych: Normal affect and  thought content       M. Jimmey Ralph, MD 09/29/2022 10:46 AM

## 2022-09-29 NOTE — Assessment & Plan Note (Signed)
Check lipids.  Continue pravastatin 80 mg daily.

## 2022-09-29 NOTE — Assessment & Plan Note (Signed)
Stable on Xanax as needed.

## 2022-09-29 NOTE — Assessment & Plan Note (Signed)
Check c-Met. °

## 2022-09-29 NOTE — Assessment & Plan Note (Signed)
Stable on aspirin and statin. 

## 2022-09-29 NOTE — Patient Instructions (Signed)
It was very nice to see you today!  Please double check occasions when you get home and let us know if we need to update anything.  We will check blood work today.  Return in about 1 year (around 09/29/2023) for Annual Physical.   Take care, Dr Jimmey Ralph  PLEASE NOTE:  If you had any lab tests, please let us know if you have not heard back within a few days. You may see your results on mychart before we have a chance to review them but we will give you a call once they are reviewed by [place].   If we ordered any referrals today, please let us know if you have not heard from their office within the next week.   If you had any urgent prescriptions sent in today, please check with the pharmacy within an hour of our visit to make sure the prescription was transmitted appropriately.   Please try these tips to maintain a healthy lifestyle:  Eat at least 3 REAL meals and 1-2 snacks per day.  Aim for no more than 5 hours between eating.  If you eat breakfast, please do so within one hour of getting up.   Each meal should contain half fruits/vegetables, one quarter protein, and one quarter carbs (no bigger than a computer mouse)  Cut down on sweet beverages. This includes juice, soda, and sweet tea.   Drink at least 1 glass of water with each meal and aim for at least 8 glasses per day  Exercise at least 150 minutes every week.

## 2022-09-29 NOTE — Assessment & Plan Note (Signed)
Still having some issues with sleep.  Currently has Remeron 45 mg daily, gabapentin 200 mg daily, Seroquel 50 mg nightly, and Xanax as needed on medication list.  Patient's daughter thinks that he is not taking the Remeron however she is not sure.  She thinks that he is taking the rest of his medications.  They will double check when he gets home and we will restart Remeron 15 mg nightly if needed.

## 2022-09-30 ENCOUNTER — Other Ambulatory Visit: Payer: Self-pay | Admitting: *Deleted

## 2022-09-30 DIAGNOSIS — E870 Hyperosmolality and hypernatremia: Secondary | ICD-10-CM

## 2022-09-30 DIAGNOSIS — E871 Hypo-osmolality and hyponatremia: Secondary | ICD-10-CM

## 2022-09-30 NOTE — Progress Notes (Signed)
His sodium is a bit lower than his typical baseline.  Please have him come back for recheck be met.  Please place future order.  The rest of his labs are all stable and we can recheck in year.

## 2022-10-08 ENCOUNTER — Other Ambulatory Visit (INDEPENDENT_AMBULATORY_CARE_PROVIDER_SITE_OTHER): Payer: Medicare HMO

## 2022-10-08 DIAGNOSIS — E871 Hypo-osmolality and hyponatremia: Secondary | ICD-10-CM

## 2022-10-08 LAB — COMPREHENSIVE METABOLIC PANEL
ALT: 9 U/L (ref 0–53)
AST: 21 U/L (ref 0–37)
Albumin: 4.2 g/dL (ref 3.5–5.2)
Alkaline Phosphatase: 54 U/L (ref 39–117)
BUN: 15 mg/dL (ref 6–23)
CO2: 26 mEq/L (ref 19–32)
Calcium: 9.9 mg/dL (ref 8.4–10.5)
Chloride: 96 mEq/L (ref 96–112)
Creatinine, Ser: 1.41 mg/dL (ref 0.40–1.50)
GFR: 45.22 mL/min — ABNORMAL LOW (ref >60.00)
Glucose, Bld: 129 mg/dL — ABNORMAL HIGH (ref 70–99)
Potassium: 4.7 mEq/L (ref 3.5–5.1)
Sodium: 128 mEq/L — ABNORMAL LOW (ref 135–145)
Total Bilirubin: 0.4 mg/dL (ref 0.2–1.2)
Total Protein: 7.5 g/dL (ref 6.0–8.3)

## 2022-10-14 NOTE — Progress Notes (Signed)
His sodium is still low but stable. He has been around this range for the last several years. We can continue to monitor for now.  Katina Degree. Jimmey Ralph, MD 10/14/2022 8:28 AM

## 2022-10-15 ENCOUNTER — Ambulatory Visit (HOSPITAL_COMMUNITY)
Admission: RE | Admit: 2022-10-15 | Discharge: 2022-10-15 | Disposition: A | Discharge status: Home / Self Care | Payer: Medicare HMO | Source: Ambulatory Visit | Attending: Cardiology | Admitting: Cardiology

## 2022-10-15 DIAGNOSIS — I6523 Occlusion and stenosis of bilateral carotid arteries: Secondary | ICD-10-CM | POA: Insufficient documentation

## 2022-10-17 ENCOUNTER — Encounter: Payer: Self-pay | Admitting: Cardiology

## 2022-10-17 ENCOUNTER — Other Ambulatory Visit (HOSPITAL_COMMUNITY): Payer: Self-pay | Admitting: *Deleted

## 2022-10-17 DIAGNOSIS — I6523 Occlusion and stenosis of bilateral carotid arteries: Secondary | ICD-10-CM

## 2022-10-26 ENCOUNTER — Other Ambulatory Visit: Payer: Self-pay | Admitting: Family Medicine

## 2022-10-26 DIAGNOSIS — I10 Essential (primary) hypertension: Secondary | ICD-10-CM

## 2022-11-12 ENCOUNTER — Encounter (HOSPITAL_BASED_OUTPATIENT_CLINIC_OR_DEPARTMENT_OTHER): Payer: Self-pay | Admitting: Emergency Medicine

## 2022-11-12 ENCOUNTER — Other Ambulatory Visit: Payer: Self-pay

## 2022-11-12 ENCOUNTER — Emergency Department (HOSPITAL_BASED_OUTPATIENT_CLINIC_OR_DEPARTMENT_OTHER)
Admission: EM | Admit: 2022-11-12 | Discharge: 2022-11-12 | Disposition: A | Discharge status: Home / Self Care | Payer: Medicare HMO | Attending: Emergency Medicine | Admitting: Emergency Medicine

## 2022-11-12 DIAGNOSIS — H538 Other visual disturbances: Secondary | ICD-10-CM | POA: Insufficient documentation

## 2022-11-12 DIAGNOSIS — R7989 Other specified abnormal findings of blood chemistry: Secondary | ICD-10-CM | POA: Insufficient documentation

## 2022-11-12 DIAGNOSIS — Z7982 Long term (current) use of aspirin: Secondary | ICD-10-CM | POA: Diagnosis not present

## 2022-11-12 DIAGNOSIS — E871 Hypo-osmolality and hyponatremia: Secondary | ICD-10-CM | POA: Insufficient documentation

## 2022-11-12 DIAGNOSIS — R55 Syncope and collapse: Secondary | ICD-10-CM | POA: Diagnosis not present

## 2022-11-12 DIAGNOSIS — Z79899 Other long term (current) drug therapy: Secondary | ICD-10-CM | POA: Insufficient documentation

## 2022-11-12 DIAGNOSIS — R42 Dizziness and giddiness: Secondary | ICD-10-CM | POA: Diagnosis present

## 2022-11-12 LAB — URINALYSIS, ROUTINE W REFLEX MICROSCOPIC
Bilirubin Urine: NEGATIVE
Glucose, UA: NEGATIVE mg/dL
Hgb urine dipstick: NEGATIVE
Ketones, ur: NEGATIVE mg/dL
Leukocytes,Ua: NEGATIVE
Nitrite: NEGATIVE
Specific Gravity, Urine: 1.009 (ref 1.005–1.030)
pH: 5.5 (ref 5.0–8.0)

## 2022-11-12 LAB — CBC
HCT: 40 % (ref 39.0–52.0)
Hemoglobin: 13.7 g/dL (ref 13.0–17.0)
MCH: 31.9 pg (ref 26.0–34.0)
MCHC: 34.3 g/dL (ref 30.0–36.0)
MCV: 93.2 fL (ref 80.0–100.0)
Platelets: 304 10*3/uL (ref 150–400)
RBC: 4.29 MIL/uL (ref 4.22–5.81)
RDW: 13 % (ref 11.5–15.5)
WBC: 8.2 10*3/uL (ref 4.0–10.5)
nRBC: 0 % (ref 0.0–0.2)

## 2022-11-12 LAB — BASIC METABOLIC PANEL
Anion gap: 11 (ref 5–15)
BUN: 27 mg/dL — ABNORMAL HIGH (ref 8–23)
CO2: 23 mmol/L (ref 22–32)
Calcium: 9.7 mg/dL (ref 8.9–10.3)
Chloride: 95 mmol/L — ABNORMAL LOW (ref 98–111)
Creatinine, Ser: 1.62 mg/dL — ABNORMAL HIGH (ref 0.61–1.24)
GFR, Estimated: 41 mL/min — ABNORMAL LOW (ref >60)
Glucose, Bld: 99 mg/dL (ref 70–99)
Potassium: 4.4 mmol/L (ref 3.5–5.1)
Sodium: 129 mmol/L — ABNORMAL LOW (ref 135–145)

## 2022-11-12 LAB — CBG MONITORING, ED: Glucose-Capillary: 80 mg/dL (ref 70–99)

## 2022-11-12 MED ORDER — SODIUM CHLORIDE 0.9 % IV BOLUS
500.0000 mL | Freq: Once | INTRAVENOUS | Status: AC
  Administered 2022-11-12: 500 mL via INTRAVENOUS

## 2022-11-12 NOTE — ED Triage Notes (Signed)
Episode of dizziness and "felt like it was going dark" Lasted about few minutes, then resolved completely. Happened around 1:30pm today while golfing Recent dizziness about a week ago

## 2022-11-12 NOTE — ED Provider Notes (Signed)
[place] EMERGENCY DEPARTMENT AT [facility] Provider Note   CSN: [id number] Arrival date & time: 11/12/22  1557     History {Add pertinent medical, surgical, social history, OB history to HPI:1} Chief Complaint  Patient presents with   Dizziness    [name] [name] is a 88 y.o. male.  Patient brought in by family after an episode of seeing darkness and feeling lightheaded like he might pass out.  He said he was on his side-by-side going to look at the length of the grass out back when they experience this earlier today.  He said he drove back to the house and the feeling had gone.  Lasted maybe 5 to 10 minutes.  He said he had 1 other prior episode of this month ago but it was different than this.  It was not associated with any chest pain headache numbness weakness shortness of breath.  History of low sodium, maybe does not drink enough fluids per his daughter.  He said he feels low back to baseline and would like to be discharged.  The history is provided by the patient and a relative.  Dizziness Quality:  Lightheadedness Severity:  Moderate Onset quality:  Sudden Duration: 5-10 min. Timing:  Constant Progression:  Resolved Chronicity:  New Relieved by:  None tried Worsened by:  Nothing Ineffective treatments:  None tried Associated symptoms: vision changes   Associated symptoms: no chest pain, no diarrhea, no headaches, no nausea, no shortness of breath, no vomiting and no weakness        Home Medications Prior to Admission medications   Medication Sig Start Date End Date Taking? Authorizing Provider  ALPRAZolam Prudy Feeler) 0.5 MG tablet TAKE 1 TABLET BY MOUTH AT BEDTIME AS NEEDED FOR ANXIETY 06/23/22   Ardith Dark, MD  amLODipine (NORVASC) 10 MG tablet TAKE 1 TABLET BY MOUTH EVERY DAY 10/27/22   Ardith Dark, MD  aspirin 81 MG tablet Take 81 mg by mouth daily.    [provider]  gabapentin (NEURONTIN) 100 MG capsule TAKE 2 CAPSULES BY MOUTH AT BEDTIME  10/27/22   Ardith Dark, MD  hydrALAZINE (APRESOLINE) 10 MG tablet TAKE 1 TABLET BY MOUTH TWICE A DAY 11/05/21   Rollene Rotunda, MD  lisinopril (ZESTRIL) 40 MG tablet TAKE 1 TABLET BY MOUTH EVERY DAY 09/24/22   Ardith Dark, MD  mirtazapine (REMERON) 45 MG tablet TAKE 1 TABLET BY MOUTH EVERYDAY AT BEDTIME 08/26/22   Ardith Dark, MD  nitroGLYCERIN (NITROSTAT) 0.4 MG SL tablet Place 1 tablet (0.4 mg total) under the tongue every 5 (five) minutes as needed for chest pain. 10/07/19 09/29/22  Rollene Rotunda, MD  pravastatin (PRAVACHOL) 80 MG tablet TAKE 1 TABLET BY MOUTH EVERY DAY 08/25/22   Ardith Dark, MD  QUEtiapine (SEROQUEL) 50 MG tablet TAKE 1 TABLET BY MOUTH EVERYDAY AT BEDTIME 06/23/22   Ardith Dark, MD      Allergies    Patient has no known allergies.    Review of Systems   Review of Systems  Constitutional:  Negative for fever.  Eyes:  Positive for visual disturbance.  Respiratory:  Negative for shortness of breath.   Cardiovascular:  Negative for chest pain.  Gastrointestinal:  Negative for diarrhea, nausea and vomiting.  Neurological:  Positive for dizziness. Negative for weakness and headaches.    Physical Exam Updated Vital Signs BP (!) 154/93   Pulse 66   Temp 97.9 F (36.6 C)   Resp 18  SpO2 94%  Physical Exam Vitals and nursing note reviewed.  Constitutional:      General: He is not in acute distress.    Appearance: Normal appearance. He is well-developed.  HENT:     Head: Normocephalic and atraumatic.  Eyes:     Conjunctiva/sclera: Conjunctivae normal.  Cardiovascular:     Rate and Rhythm: Normal rate and regular rhythm.     Heart sounds: No murmur heard. Pulmonary:     Effort: Pulmonary effort is normal. No respiratory distress.     Breath sounds: Normal breath sounds.  Abdominal:     Palpations: Abdomen is soft.     Tenderness: There is no abdominal tenderness. There is no guarding or rebound.  Musculoskeletal:        General: No deformity.  Normal range of motion.     Cervical back: Neck supple.  Skin:    General: Skin is warm and dry.     Capillary Refill: Capillary refill takes less than 2 seconds.  Neurological:     General: No focal deficit present.     Mental Status: He is alert.     Sensory: No sensory deficit.     Motor: No weakness.     ED Results / Procedures / Treatments   Labs (all labs ordered are listed, but only abnormal results are displayed) Labs Reviewed  BASIC METABOLIC PANEL - Abnormal; Notable for the following components:      Result Value   Sodium 129 (*)    Chloride 95 (*)    BUN 27 (*)    Creatinine, Ser 1.62 (*)    GFR, Estimated 41 (*)    All other components within normal limits  URINALYSIS, ROUTINE W REFLEX MICROSCOPIC - Abnormal; Notable for the following components:   Protein, ur TRACE (*)    All other components within normal limits  CBC  CBG MONITORING, ED  CBG MONITORING, ED    EKG EKG Interpretation Date/Time:  Wednesday November 12 2022 16:23:46 EDT Ventricular Rate:  66 PR Interval:  122 QRS Duration:  84 QT Interval:  412 QTC Calculation: 431 R Axis:   69  Text Interpretation: Normal sinus rhythm with sinus arrhythmia Normal ECG When compared with ECG of 09-Jun-2016 12:52, No significant change since last tracing Confirmed by Meridee Score [phone]) on 11/12/2022 4:25:27 PM  Radiology No results found.  Procedures Procedures  {Document cardiac monitor, telemetry assessment procedure when appropriate:1}  Medications Ordered in ED Medications  sodium chloride 0.9 % bolus 500 mL (has no administration in time range)    ED Course/ Medical Decision Making/ A&P   {   Click here for ABCD2, HEART and other calculatorsREFRESH Note before signing :1}                              Medical Decision Making Amount and/or Complexity of Data Reviewed Labs: ordered.   This patient complains of ***; this involves an extensive number of treatment Options and is a  complaint that carries with it a high risk of complications and morbidity. The differential includes ***  I ordered, reviewed and interpreted labs, which included *** I ordered medication *** and reviewed PMP when indicated. I ordered imaging studies which included *** and I independently    visualized and interpreted imaging which showed *** Additional history obtained from *** Previous records obtained and reviewed *** I consulted *** and discussed lab and imaging findings and discussed disposition.  Cardiac monitoring reviewed, *** Social determinants considered, *** Critical Interventions: ***  After the interventions stated above, I reevaluated the patient and found *** Admission and further testing considered, ***   {Document critical care time when appropriate:1} {Document review of labs and clinical decision tools ie heart score, Chads2Vasc2 etc:1}  {Document your independent review of radiology images, and any outside records:1} {Document your discussion with family members, caretakers, and with consultants:1} {Document social determinants of health affecting pt's care:1} {Document your decision making why or why not admission, treatments were needed:1} Final Clinical Impression(s) / ED Diagnoses Final diagnoses:  None    Rx / DC Orders ED Discharge Orders     None

## 2022-11-12 NOTE — ED Notes (Signed)
Pt ambulatory to bathroom with no issue

## 2022-11-12 NOTE — Discharge Instructions (Signed)
Please continue your regular medications and keep well-hydrated.  Follow-up with your primary care doctor.  Return to the emergency department if any worsening or concerning symptoms

## 2022-11-13 ENCOUNTER — Telehealth: Payer: Self-pay | Admitting: Family Medicine

## 2022-11-13 NOTE — Telephone Encounter (Signed)
Patient was seen yesterday at ED

## 2022-11-13 NOTE — Telephone Encounter (Signed)
Pt was seen in [facility] on 11/12/22  Patient Name First: [name] Last: [name] Gender: Male DOB: [birth year] Age: 88 Y 3 M 21 D Return Phone Number: [phone] (Primary), [phone] (Secondary) Address: City/ State/ Zip: [place] [address] Client [place] [employer] [facility] Provider Jacquiline Doe- MD Contact Type Call Who Is Calling Patient / Member / Family / Caregiver Call Type Triage / Clinical Caller Name Rolanda Lundborg Relationship To Patient Daughter Return Phone Number [phone] (Primary) Chief Complaint VISION - sudden loss or sudden decrease (NOT blurred vision) Reason for Call Symptomatic / Request for Health Information Initial Comment Caller is from [place], with a patient, everything got dark and he was dizzy for about and now he feels. On the phone is the patient's daughter. He sees Dr.Parker Caller agrees with the statements. Translation No Nurse Assessment Nurse: Nunzio Cory, RN, Sherrie Date/Time (Eastern Time): 11/12/2022 2:50:04 PM Confirm and document reason for call. If symptomatic, describe symptoms. ---Caller states father had an episode of "everything got dark" and had dizziness. States episode lasted < 10 min. but not sure. States feels fine now. Was able to walk fine after episode. +fluids, +UOP in last hrs Does the patient have any new or worsening symptoms? ---Yes Will a triage be completed? ---Yes Related visit to physician within the last 2 weeks? ---No Does the PT have any chronic conditions? (i.e. diabetes, asthma, this includes High risk factors for pregnancy, etc.) ---Yes List chronic conditions. ---HTN, Heart Stent in 2008, Is this a behavioral health or substance abuse call? ---No Guidelines Guideline Title Affirmed Question Affirmed Notes Nurse Date/Time (Eastern Time) Dizziness - Lightheadedness Loss of vision or double vision Nunzio Cory, RN,  Charlton Amor 11/12/2022 2:54:59 PM Guidelines Guideline Title Affirmed Question Affirmed Notes Nurse Date/Time (Eastern Time) (Exception: Similar to previous migraines.) Disp. Time Lamount Cohen Time) Disposition Final User 11/12/2022 2:48:37 PM Send to Urgent Queue Wandra Mannan 11/12/2022 2:57:29 PM Go to ED Now (or PCP triage) Yes Nunzio Cory, RN, Sherrie Final Disposition 11/12/2022 2:57:29 PM Go to ED Now (or PCP triage) Yes Nunzio Cory, RN, Sherrie Caller Disagree/Comply Comply Caller Understands Yes PreDisposition InappropriateToAsk Care Advice Given Per Guideline GO TO ED/UCC NOW (OR PCP TRIAGE): * IF NO PCP (PRIMARY CARE PROVIDER) SECOND-LEVEL TRIAGE: You need to be seen within the next hour. Go to the ED/UCC at _____________ Hospital. Leave as soon as you can. ANOTHER ADULT SHOULD DRIVE: * It is better and safer if another adult drives instead of you. Comments User: [place] [facility], RN Date/Time (Eastern Time): 11/12/2022 2:56:13 PM BP 134/68 HR 64 Referrals [facility] - ED

## 2022-11-17 ENCOUNTER — Ambulatory Visit (INDEPENDENT_AMBULATORY_CARE_PROVIDER_SITE_OTHER): Payer: Medicare HMO | Admitting: Family Medicine

## 2022-11-17 VITALS — BP 144/64 | HR 64 | Temp 98.0°F | Ht 68.0 in | Wt 165.8 lb

## 2022-11-17 DIAGNOSIS — L570 Actinic keratosis: Secondary | ICD-10-CM

## 2022-11-17 DIAGNOSIS — R55 Syncope and collapse: Secondary | ICD-10-CM | POA: Diagnosis not present

## 2022-11-17 DIAGNOSIS — I10 Essential (primary) hypertension: Secondary | ICD-10-CM | POA: Diagnosis not present

## 2022-11-17 DIAGNOSIS — N183 Chronic kidney disease, stage 3 unspecified: Secondary | ICD-10-CM | POA: Diagnosis not present

## 2022-11-17 DIAGNOSIS — L989 Disorder of the skin and subcutaneous tissue, unspecified: Secondary | ICD-10-CM

## 2022-11-17 LAB — CBC
HCT: 42.5 % (ref 39.0–52.0)
Hemoglobin: 14.1 g/dL (ref 13.0–17.0)
MCHC: 33.3 g/dL (ref 30.0–36.0)
MCV: 96.2 fL (ref 78.0–100.0)
Platelets: 307 10*3/uL (ref 150.0–400.0)
RBC: 4.42 Mil/uL (ref 4.22–5.81)
RDW: 13.6 % (ref 11.5–15.5)
WBC: 7.4 10*3/uL (ref 4.0–10.5)

## 2022-11-17 LAB — COMPREHENSIVE METABOLIC PANEL
ALT: 10 U/L (ref 0–53)
AST: 22 U/L (ref 0–37)
Albumin: 4.3 g/dL (ref 3.5–5.2)
Alkaline Phosphatase: 56 U/L (ref 39–117)
BUN: 16 mg/dL (ref 6–23)
CO2: 24 meq/L (ref 19–32)
Calcium: 10 mg/dL (ref 8.4–10.5)
Chloride: 97 meq/L (ref 96–112)
Creatinine, Ser: 1.36 mg/dL (ref 0.40–1.50)
GFR: 47.18 mL/min — ABNORMAL LOW (ref >60.00)
Glucose, Bld: 92 mg/dL (ref 70–99)
Potassium: 4.4 meq/L (ref 3.5–5.1)
Sodium: 129 meq/L — ABNORMAL LOW (ref 135–145)
Total Bilirubin: 0.4 mg/dL (ref 0.2–1.2)
Total Protein: 7.7 g/dL (ref 6.0–8.3)

## 2022-11-17 NOTE — Progress Notes (Signed)
[name] [name] is a 88 y.o. male who presents today for an office visit.  Assessment/Plan:  New/Acute Problems: Near Syncope No recurrence since being in the ED few days ago.  Labs were consistent with slight dehydration.  We discussed importance of good oral hydration with recommendation to get at least 64 ounces of water per day.  Will recheck labs today.  Given that he has not had any recurrence of symptoms do not think we need to pursue further workup at this point.  Reassuring exam today.  He will let us know if he does have recurrence of symptoms and would consider referral to cardiology.  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  We discussed treatment options including referral for biopsy versus cryotherapy.  Patient elected to pursue cryotherapy today.  This was applied today.  See below procedure note.  He tolerated well.  He will let us know if not improving in the next couple weeks and we can refer to dermatology or perform biopsy at our office.  Chronic Problems Addressed Today: Essential hypertension Mildly elevated today though typically well-controlled.  They will continue current regimen per cardiology with hydralazine 10 mg twice daily, amlodipine 10 mg daily, and lisinopril 40 mg daily.  They will continue to monitor at home and let us know if persistently elevated.  CKD (chronic kidney disease) stage 3, GFR 30-59 ml/min (HCC) Mild elevation in creatinine with recent ED visit likely due to dehydration.  Will recheck today.     Subjective:  HPI:  See A/P for status of chronic conditions.  Patient is here today for ED follow-up.  Went to the ED 5 days ago with presyncopal episode.  Stated that he had an episode of feeling lightheaded like he was going pass out.  Vision went dark.  This lasted for 5 to 10 minutes before subsiding.  He did have an episode similar to this about a month ago.  No chest pain, numbness, weakness, headache, shortness  of breath.  In the ED had workup including labs which showed azotemia with elevated BUN and creatinine. He was told that he was dehydrated and given 1 liter of normal saline and discharged home. HE has done well the last 5 days. No recurrence of symptoms.   Patient reports that he is drinking about 1-2 cups of coffee per day and sips on some mountain dew. Otherwise does not drink not much fluid.   He also has a skin lesion on left arm. This has been present for the last several months.  Has a lot of irritation at the area.  Has thick callus to the area.       Objective:  Physical Exam: BP (!) 144/64   Pulse 64   Temp 98 F (36.7 C) (Temporal)   Ht 5\' 8"  (1.727 m)   Wt 165 lb 12.8 oz (75.2 kg)   SpO2 98%   BMI 25.21 kg/m   Gen: No acute distress, resting comfortably CV: Regular rate and rhythm with no murmurs appreciated Pulm: Normal work of breathing, clear to auscultation bilaterally with no crackles, wheezes, or rhonchi Skin: Approximately 1 cm diameter hyperkeratotic lesion on left lateral arm. Neuro: Grossly normal, moves all extremities Psych: Normal affect and thought content  Cryotherapy Procedure Note  Pre-operative Diagnosis: Actinic keratosis  Locations: Left ARm  Indications: Therapeutic  Procedure Details  Patient informed of risks (permanent scarring, infection, light or dark discoloration, bleeding, infection, weakness, numbness and recurrence of the lesion) and  benefits of the procedure and verbal informed consent obtained.  The areas are treated with liquid nitrogen therapy, frozen until ice ball extended 3 mm beyond lesion, allowed to thaw, and treated again. The patient tolerated procedure well.  The patient was instructed on post-op care, warned that there may be blister formation, redness and pain. Recommend OTC analgesia as needed for pain.  Condition: Stable  Complications: none.       Katina Degree. Jimmey Ralph, MD 11/17/2022 11:27 AM

## 2022-11-17 NOTE — Assessment & Plan Note (Addendum)
Mildly elevated today though typically well-controlled.  They will continue current regimen per cardiology with hydralazine 10 mg twice daily, amlodipine 10 mg daily, and lisinopril 40 mg daily.  They will continue to monitor at home and let us know if persistently elevated.

## 2022-11-17 NOTE — Patient Instructions (Signed)
It was very nice to see you today!  We will check blood work today.  Please make sure that you are getting plenty of fluids.  Let us know if you have any return of symptoms.  We froze a spot on your arm today.  Let us know if this does not fall for the next couple of weeks.  Return if symptoms worsen or fail to improve.   Take care, Dr Jimmey Ralph  PLEASE NOTE:  If you had any lab tests, please let us know if you have not heard back within a few days. You may see your results on mychart before we have a chance to review them but we will give you a call once they are reviewed by [place].   If we ordered any referrals today, please let us know if you have not heard from their office within the next week.   If you had any urgent prescriptions sent in today, please check with the pharmacy within an hour of our visit to make sure the prescription was transmitted appropriately.   Please try these tips to maintain a healthy lifestyle:  Eat at least 3 REAL meals and 1-2 snacks per day.  Aim for no more than 5 hours between eating.  If you eat breakfast, please do so within one hour of getting up.   Each meal should contain half fruits/vegetables, one quarter protein, and one quarter carbs (no bigger than a computer mouse)  Cut down on sweet beverages. This includes juice, soda, and sweet tea.   Drink at least 1 glass of water with each meal and aim for at least 8 glasses per day  Exercise at least 150 minutes every week.

## 2022-11-17 NOTE — Assessment & Plan Note (Signed)
Mild elevation in creatinine with recent ED visit likely due to dehydration.  Will recheck today.

## 2022-11-18 NOTE — Progress Notes (Signed)
His kidney numbers are back to his baseline.  Does not appear as if he is dehydrated.  He should make sure that he is getting plenty of fluids daily.  He should let us know if he has any recurrence in symptoms.

## 2022-11-26 ENCOUNTER — Other Ambulatory Visit: Payer: Self-pay | Admitting: Family Medicine

## 2022-11-26 DIAGNOSIS — E781 Pure hyperglyceridemia: Secondary | ICD-10-CM

## 2022-12-11 ENCOUNTER — Other Ambulatory Visit: Payer: Self-pay | Admitting: Cardiology

## 2022-12-25 ENCOUNTER — Other Ambulatory Visit: Payer: Self-pay | Admitting: *Deleted

## 2022-12-25 DIAGNOSIS — F419 Anxiety disorder, unspecified: Secondary | ICD-10-CM

## 2022-12-25 MED ORDER — ALPRAZOLAM 0.5 MG PO TABS
ORAL_TABLET | ORAL | 5 refills | Status: DC
Start: 2022-12-25 — End: 2023-01-02

## 2022-12-25 NOTE — Telephone Encounter (Signed)
Pharmacy requesting refill R Alprazolam

## 2022-12-26 NOTE — Telephone Encounter (Signed)
Please add these RX to the list of refills  Prescription Request  12/26/2022  LOV: Visit date not found  What is the name of the medication or equipment?  lisinopril (ZESTRIL) 40 MG tablet   hydrALAZINE (APRESOLINE) 10 MG tablet    Have you contacted your pharmacy to request a refill? No   Which pharmacy would you like this sent to? [id number] [place], [place] - [address] [place] [address] Phone: [phone] Fax: [phone]   Patient notified that their request is being sent to the clinical staff for review and that they should receive a response within 2 business days.   Please advise at Mobile [phone] (mobile)

## 2022-12-30 NOTE — Telephone Encounter (Signed)
ALPRAZolam (XANAX) 0.5 MG tablet  Was sent to the wrong pharmacy, it should have been sent to  [id number] [place], [place] - [address] [place] [address] Phone: [phone] Fax: [phone]  Pt is completely out and needs meds asap. Please advise.

## 2022-12-31 NOTE — Telephone Encounter (Signed)
ALPRAZolam (XANAX) 0.5 MG tablet  Was sent to the wrong pharmacy, it should have been sent to  [id number] [place], [place] - [address] [place] [address] Phone: [phone] Fax: [phone]  Pt is completely out and needs meds asap. Please advise.   NEEDS MEDS ASAP

## 2023-01-02 ENCOUNTER — Telehealth: Payer: Self-pay

## 2023-01-02 ENCOUNTER — Other Ambulatory Visit: Payer: Self-pay | Admitting: Family Medicine

## 2023-01-02 ENCOUNTER — Other Ambulatory Visit: Payer: Self-pay

## 2023-01-02 DIAGNOSIS — F419 Anxiety disorder, unspecified: Secondary | ICD-10-CM

## 2023-01-02 MED ORDER — ALPRAZOLAM 0.5 MG PO TABS
ORAL_TABLET | ORAL | 5 refills | Status: DC
Start: 2023-01-02 — End: 2023-07-06

## 2023-01-02 NOTE — Telephone Encounter (Signed)
Returned call and advised pt daughter Rosey Bath this has been corrected this morning for patient and apologized for any delay. Also, called [facility] [address] for patient to ensure it was received and would be ready for pt today. Pharmacist Kathlene November advised it would be ready later today.

## 2023-01-02 NOTE — Telephone Encounter (Signed)
Anxiety meds sent to mail order service by mistake and needs to be sent to [facility] [address]. Can you please approve and send to correct pharmacy for patient

## 2023-01-03 ENCOUNTER — Other Ambulatory Visit: Payer: Self-pay | Admitting: Cardiology

## 2023-02-01 ENCOUNTER — Other Ambulatory Visit: Payer: Self-pay | Admitting: Cardiology

## 2023-02-10 ENCOUNTER — Other Ambulatory Visit: Payer: Self-pay | Admitting: Cardiology

## 2023-02-19 ENCOUNTER — Other Ambulatory Visit: Payer: Self-pay | Admitting: Cardiology

## 2023-02-20 NOTE — Telephone Encounter (Signed)
 Patient request hydralazine (APRESOLINE) 10 MG tablet. Pt has already had three refill request. Last office visit was 10/11/20. He doesn't have an appointment scheduled either.

## 2023-02-24 ENCOUNTER — Other Ambulatory Visit: Payer: Self-pay

## 2023-02-24 MED ORDER — HYDRALAZINE HCL 10 MG PO TABS: 10.0000 mg | ORAL_TABLET | Freq: Two times a day (BID) | ORAL | 0 refills | Status: DC

## 2023-03-04 ENCOUNTER — Other Ambulatory Visit: Payer: Self-pay | Admitting: Family Medicine

## 2023-03-04 ENCOUNTER — Other Ambulatory Visit: Payer: Self-pay | Admitting: Cardiology

## 2023-03-04 DIAGNOSIS — I10 Essential (primary) hypertension: Secondary | ICD-10-CM

## 2023-03-04 DIAGNOSIS — E781 Pure hyperglyceridemia: Secondary | ICD-10-CM

## 2023-03-12 ENCOUNTER — Telehealth: Payer: Self-pay | Admitting: Cardiology

## 2023-03-12 MED ORDER — HYDRALAZINE HCL 10 MG PO TABS: 10.0000 mg | ORAL_TABLET | Freq: Two times a day (BID) | ORAL | 0 refills | Status: DC

## 2023-03-12 NOTE — Telephone Encounter (Signed)
*  STAT* If patient is at the pharmacy, call can be transferred to refill team.   1. Which medications need to be refilled? (please list name of each medication and dose if known) hydrALAZINE (APRESOLINE) 10 MG tablet   2. Which pharmacy/location (including street and city if local pharmacy) is medication to be sent to?  [id number] [place],  - [address]   3. Do they need a 30 day or 90 day supply? 90

## 2023-03-27 ENCOUNTER — Other Ambulatory Visit: Payer: Self-pay | Admitting: Family Medicine

## 2023-03-27 DIAGNOSIS — I10 Essential (primary) hypertension: Secondary | ICD-10-CM

## 2023-03-31 ENCOUNTER — Encounter: Payer: Self-pay | Admitting: Cardiology

## 2023-03-31 NOTE — Telephone Encounter (Signed)
error 

## 2023-04-03 ENCOUNTER — Ambulatory Visit: Payer: Medicare HMO | Admitting: Adult Health

## 2023-04-04 ENCOUNTER — Other Ambulatory Visit: Payer: Self-pay | Admitting: Cardiology

## 2023-04-05 ENCOUNTER — Other Ambulatory Visit: Payer: Self-pay | Admitting: Family Medicine

## 2023-04-05 DIAGNOSIS — E781 Pure hyperglyceridemia: Secondary | ICD-10-CM

## 2023-04-22 NOTE — Progress Notes (Signed)
 Cardiology Clinic Note   Patient Name: [name] [name] Date of Encounter: 04/24/2023  Primary Care Provider:  Ardith Dark, MD Primary Cardiologist:  Rollene Rotunda, MD  Patient Profile    [name] [name] 89 year old male presents to the clinic today for follow-up evaluation of his coronary artery disease, near syncope, and essential hypertension.  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  He was seen in follow-up by Dr. Antoine Poche 07/19/2021.  During that time he remained stable from a cardiac standpoint.  He continued to be active.  He did note some dizziness with standing.  His diastolic blood pressure was running low in the 50s.  His systolic blood pressure was stable.  He denied chest pain.  He denied palpitations presyncope and syncope.  He did note some mild episodes of orthostasis that were transient.  Reducing blood pressure medication if dizziness increased was discussed.  He was continued on his medication regimen.  Repeat carotid ultrasounds were planned for August.  His carotid ultrasound 10/15/2022 showed 40-59% bilateral ICA stenosis.  Follow-up ultrasounds were planned for 1 year.  He was seen and evaluated in the emergency department  on 11/12/2022.  He reported an episode of seen darkness and feeling lightheaded.  He reported that he was driving on his side-by-side when he experienced the issue.  He turned around his vehicle and drove back to the house.  His feeling dissipated.  He reported that the episode lasted for 5-10 minutes.  He noted he had 1 episode earlier in the month.  He denied chest pain and shortness of breath.  He indicated that he continue to follow a low-sodium diet and may not be hydrating well.  His BMP showed a sodium of 129 and creatinine of 1.62.  His CBC was unremarkable.  He received half a liter of IV fluids.  His EKG showed normal sinus rhythm 66 bpm.  He was instructed to follow-up with his PCP.  He was instructed to maintain p.o. hydration.  He presents to the clinic today for follow-up evaluation and states he has been doing well.  He reports that his nitroglycerin turned into powder.  He presents with his daughter.  He continues to do well from a cardiac standpoint.  He continues to be somewhat physically active.  He has no cardiac complaints today.  His blood pressure is slightly elevated at 144/64 and on recheck is 134/62.  He reports that he has been eating a low-sodium diet.  I will refill his nitroglycerin, hydralazine, amlodipine, and lisinopril.  Will plan follow-up in around 9 months..  Today he denies chest pain, shortness of breath, lower extremity edema, fatigue, palpitations, melena, hematuria, hemoptysis, diaphoresis, weakness, presyncope, syncope, orthopnea, and PND.  Home Medications    Prior to Admission medications   Medication Sig Start Date End Date Taking? Authorizing Provider  ALPRAZolam Prudy Feeler) 0.5 MG tablet TAKE 1 TABLET BY MOUTH AT BEDTIME AS NEEDED FOR ANXIETY 01/02/23   Jeani Sow, MD  amLODipine (NORVASC) 10 MG tablet TAKE 1 TABLET BY MOUTH EVERY DAY 03/27/23   Ardith Dark, MD  aspirin 81 MG tablet Take 81 mg by mouth daily.    [provider]  gabapentin  (NEURONTIN) 100 MG capsule TAKE 2 CAPSULES BY MOUTH AT BEDTIME 04/06/23   Ardith Dark, MD  hydrALAZINE (APRESOLINE) 10 MG tablet TAKE 1 TABLET BY MOUTH TWICE A DAY 04/06/23   Rollene Rotunda, MD  lisinopril (ZESTRIL) 40 MG tablet TAKE 1 TABLET BY MOUTH EVERY DAY 03/27/23   Ardith Dark, MD  mirtazapine (REMERON) 45 MG tablet TAKE 1 TABLET BY MOUTH EVERYDAY AT BEDTIME 08/26/22   Ardith Dark, MD  nitroGLYCERIN (NITROSTAT) 0.4 MG SL tablet Place 1 tablet (0.4 mg total) under the tongue every 5 (five) minutes as needed for chest pain. 10/07/19 09/29/22  Rollene Rotunda, MD  pravastatin (PRAVACHOL) 80 MG tablet TAKE 1 TABLET BY MOUTH EVERY DAY 04/06/23   Ardith Dark, MD  QUEtiapine (SEROQUEL) 50 MG tablet TAKE 1 TABLET BY MOUTH EVERYDAY AT BEDTIME 06/23/22   Ardith Dark, MD    Family History    Family History  Problem Relation Age of Onset   Heart attack Mother 71   Heart attack Father 77   Hyperlipidemia Brother    He indicated that his mother is deceased. He indicated that his father is deceased. He indicated that his brother is deceased. He indicated that his maternal grandmother is deceased. He indicated that his maternal grandfather is deceased. He indicated that his paternal grandmother is deceased. He indicated that his paternal grandfather is deceased.  Social History    Social History   Socioeconomic History   Marital status: Widowed    Spouse name: Not on file   Number of children: Not on file   Years of education: Not on file   Highest education level: Not on file  Occupational History   Occupation: RETIRED  Tobacco Use   Smoking status: Former    Current packs/day: 0.00    Average packs/day: 1.5 packs/day for 20.0 years (30.0 ttl pk-yrs)    Types: Cigarettes    Start date: 02/18/1976    Quit date: 02/18/1996    Years since quitting: 27.1   Smokeless tobacco: Never  Vaping Use   Vaping status: Never Used  Substance and Sexual Activity   Alcohol use: Yes     Alcohol/week: 14.0 standard drinks of alcohol    Types: 14 Cans of beer per week    Comment: more when on vacation   Drug use: No   Sexual activity: Not Currently    Partners: Female    Comment: widowed  Other Topics Concern   Not on file  Social History Narrative   Not on file   Social Drivers of Health   Financial Resource Strain: Low Risk  (12/16/2021)   Overall Financial Resource Strain (CARDIA)    Difficulty of Paying Living Expenses: Not hard at all  Food Insecurity: No Food Insecurity (12/16/2021)   Hunger Vital Sign    Worried About Running Out of Food in the Last Year: Never true    Ran Out of Food in the Last Year: Never true  Transportation Needs: No Transportation  Needs (09/09/2022)   PRAPARE - Administrator, Civil Service (Medical): No    Lack of Transportation (Non-Medical): No  Physical Activity: Sufficiently Active (12/16/2021)   Exercise Vital Sign    Days of Exercise per Week: 5 days    Minutes of Exercise per Session: 30 min  Stress: No Stress Concern Present (12/16/2021)   [employer] of Occupational Health - Occupational Stress Questionnaire    Feeling of Stress : Not at all  Social Connections: Socially Isolated (12/16/2021)   Social Connection and Isolation Panel [NHANES]    Frequency of Communication with Friends and Family: Twice a week    Frequency of Social Gatherings with Friends and Family: More than three times a week    Attends Religious Services: Never    Database administrator or Organizations: No    Attends Banker Meetings: Never    Marital Status: Widowed  Intimate Partner Violence: Not At Risk (12/16/2021)   Humiliation, Afraid, Rape, and Kick questionnaire    Fear of Current or Ex-Partner: No    Emotionally Abused: No    Physically Abused: No    Sexually Abused: No     Review of Systems    General:  No chills, fever, night sweats or weight changes.  Cardiovascular:  No chest pain, dyspnea on  exertion, edema, orthopnea, palpitations, paroxysmal nocturnal dyspnea. Dermatological: No rash, lesions/masses Respiratory: No cough, dyspnea Urologic: No hematuria, dysuria Abdominal:   No nausea, vomiting, diarrhea, bright red blood per rectum, melena, or hematemesis Neurologic:  No visual changes, wkns, changes in mental status. All other systems reviewed and are otherwise negative except as noted above.  Physical Exam    VS:  BP 134/62   Pulse 61   Ht 5\' 6"  (1.676 m)   Wt 162 lb 9.6 oz (73.8 kg)   SpO2 98%   BMI 26.24 kg/m  , BMI Body mass index is 26.24 kg/m. GEN: Well nourished, well developed, in no acute distress. HEENT: normal. Neck: Supple, no JVD, carotid bruits, or masses. Cardiac: RRR, no murmurs, rubs, or gallops. No clubbing, cyanosis, edema.  Radials/DP/PT 2+ and equal bilaterally.  Respiratory:  Respirations regular and unlabored, clear to auscultation bilaterally. GI: Soft, nontender, nondistended, BS + x 4. MS: no deformity or atrophy. Skin: warm and dry, no rash. Neuro:  Strength and sensation are intact. Psych: Normal affect.  Accessory Clinical Findings    Recent Labs: 09/29/2022: TSH 2.06 11/17/2022: ALT 10; BUN 16; Creatinine, Ser 1.36; Hemoglobin 14.1; Platelets 307.0; Potassium 4.4; Sodium 129   Recent Lipid Panel    Component Value Date/Time   CHOL 182 09/29/2022 1105   CHOL 196 10/07/2019 1136   TRIG 48.0 09/29/2022 1105   HDL 91.80 09/29/2022 1105   HDL 111 10/07/2019 1136   CHOLHDL 2 09/29/2022 1105   VLDL 9.6 09/29/2022 1105   LDLCALC 81 09/29/2022 1105   LDLCALC 75 10/07/2019 1136   LDLDIRECT 78.8 09/10/2011 0801         ECG personally reviewed by me today- EKG Interpretation Date/Time:  Friday April 24 2023 10:25:59 EST Ventricular Rate:  61 PR Interval:  152 QRS Duration:  84 QT Interval:  394 QTC Calculation: 396 R Axis:   43  Text Interpretation: Normal sinus rhythm Minimal voltage criteria for LVH, may be normal variant  ( Sokolow-Lyon ) When compared with ECG of 12-Nov-2022 16:23, No significant change was found Confirmed by Edd Fabian [phone]) on 04/24/2023 10:29:13 AM  Carotid ultrasound 10/15/2022   Summary:  Right Carotid: Velocities in the right ICA are consistent with a 40-59%                 stenosis. The ECA appears <50% stenosed.   Left Carotid: Velocities in the left ICA are consistent with a 40-59%  stenosis.               The ECA appears >50% stenosed.   Vertebrals:  Bilateral vertebral arteries demonstrate antegrade flow.  Subclavians: Normal flow hemodynamics were seen in bilateral subclavian               arteries.   *See table(s) above for measurements and observations.  Suggest follow up study in 12 months.    Electronically signed by Lorine Bears MD on 10/16/2022 at 2:28:34 PM.        Final       Assessment & Plan   1.  Coronary artery disease-continues to deny anginal type symptoms.  Remains physically active.  Denies exertional chest discomfort. Continue amlodipine, aspirin, lisinopril, pravastatin, sublingual nitroglycerin as needed Heart healthy low-sodium diet Maintain physical activity  Hyperlipidemia-LDL 81 on 09/29/22. High-fiber diet Continue pravastatin, aspirin Repeat fasting lipids and LFTs 8/25  Essential hypertension-BP today 144/64. Continue amlodipine, lisinopril, hydralazine Maintain blood pressure log Heart healthy low-sodium diet  Near syncope-emergency department visit 11/12/2022.  Symptoms felt to be related to dehydration.  Lab work showed elevated creatinine.  He received IV fluids.  No further episodes. Maintain p.o. hydration Change positions slowly  Carotid bruit-on last check bilateral 40-59% stenosis. Plan for repeat carotid ultrasound 8/25. Continue aspirin, pravastatin  Disposition: Follow-up with Dr. Antoine Poche or me in 9 months.   Thomasene Ripple. Yashika Mask NP-C     04/24/2023, 10:42 AM [facility] [address] Office [phone] Fax [phone]    I spent 14 minutes examining this patient, reviewing medications, and using patient centered shared decision making involving their cardiac care.   I spent  20 minutes reviewing past medical history,  medications, and prior cardiac tests.

## 2023-04-24 ENCOUNTER — Encounter: Payer: Self-pay | Admitting: General Practice

## 2023-04-24 ENCOUNTER — Ambulatory Visit: Payer: Medicare HMO | Attending: General Practice | Admitting: General Practice

## 2023-04-24 VITALS — BP 134/62 | HR 61 | Ht 66.0 in | Wt 162.6 lb

## 2023-04-24 DIAGNOSIS — I6523 Occlusion and stenosis of bilateral carotid arteries: Secondary | ICD-10-CM

## 2023-04-24 DIAGNOSIS — E785 Hyperlipidemia, unspecified: Secondary | ICD-10-CM

## 2023-04-24 DIAGNOSIS — R55 Syncope and collapse: Secondary | ICD-10-CM

## 2023-04-24 DIAGNOSIS — I10 Essential (primary) hypertension: Secondary | ICD-10-CM

## 2023-04-24 DIAGNOSIS — I2510 Atherosclerotic heart disease of native coronary artery without angina pectoris: Secondary | ICD-10-CM

## 2023-04-24 DIAGNOSIS — E781 Pure hyperglyceridemia: Secondary | ICD-10-CM

## 2023-04-24 MED ORDER — AMLODIPINE BESYLATE 10 MG PO TABS: 10.0000 mg | ORAL_TABLET | Freq: Every day | ORAL | 2 refills | Status: DC

## 2023-04-24 MED ORDER — PRAVASTATIN SODIUM 80 MG PO TABS: 80.0000 mg | ORAL_TABLET | Freq: Every day | ORAL | 2 refills | Status: DC

## 2023-04-24 MED ORDER — LISINOPRIL 40 MG PO TABS: 40.0000 mg | ORAL_TABLET | Freq: Every day | ORAL | 2 refills | Status: DC

## 2023-04-24 MED ORDER — HYDRALAZINE HCL 10 MG PO TABS: 10.0000 mg | ORAL_TABLET | Freq: Two times a day (BID) | ORAL | 2 refills | Status: DC

## 2023-04-24 MED ORDER — NITROGLYCERIN 0.4 MG SL SUBL: 0.4000 mg | SUBLINGUAL_TABLET | SUBLINGUAL | 1 refills | Status: DC | PRN

## 2023-04-24 NOTE — Patient Instructions (Signed)
 Medication Instructions:  The current medical regimen is effective;  continue present plan and medications as directed. Please refer to the Current Medication list given to you today.  *If you need a refill on your cardiac medications before your next appointment, please call your pharmacy*  Lab Work: NONE    Follow-Up: At [facility], you and your health needs are our priority.  As part of our continuing mission to provide you with exceptional heart care, we have created designated Provider Care Teams.  These Care Teams include your primary Cardiologist (physician) and Advanced Practice Providers (APPs -  Physician Assistants and Nurse Practitioners) who all work together to provide you with the care you need, when you need it.  We recommend signing up for the patient portal called "MyChart".  Sign up information is provided on this After Visit Summary.  MyChart is used to connect with patients for Virtual Visits (Telemedicine).  Patients are able to view lab/test results, encounter notes, upcoming appointments, etc.  Non-urgent messages can be sent to your provider as well.   To learn more about what you can do with MyChart, go to [email or web address].    Your next appointment:   9 month(s)  Provider:   Rollene Rotunda, MD  or Edd Fabian, FNP        Other Instructions INCREASE PHYSICAL ACTIVITY-AS TOLERATED PLEASE READ AND FOLLOW ATTACHED  SALTY 6 STAY ACTIVE        s

## 2023-06-06 ENCOUNTER — Other Ambulatory Visit: Payer: Self-pay | Admitting: Family Medicine

## 2023-07-04 ENCOUNTER — Other Ambulatory Visit: Payer: Self-pay | Admitting: Family Medicine

## 2023-07-04 DIAGNOSIS — F419 Anxiety disorder, unspecified: Secondary | ICD-10-CM

## 2023-09-02 ENCOUNTER — Other Ambulatory Visit: Payer: Self-pay | Admitting: Family Medicine

## 2023-10-15 ENCOUNTER — Ambulatory Visit (HOSPITAL_COMMUNITY)
Admission: RE | Admit: 2023-10-15 | Discharge: 2023-10-15 | Disposition: A | Discharge status: Home / Self Care | Source: Ambulatory Visit | Attending: Cardiology | Admitting: Cardiology

## 2023-10-15 ENCOUNTER — Other Ambulatory Visit: Payer: Self-pay | Admitting: General Practice

## 2023-10-15 DIAGNOSIS — I6523 Occlusion and stenosis of bilateral carotid arteries: Secondary | ICD-10-CM | POA: Insufficient documentation

## 2023-10-18 ENCOUNTER — Ambulatory Visit: Payer: Self-pay | Admitting: Cardiology

## 2023-10-18 DIAGNOSIS — I6523 Occlusion and stenosis of bilateral carotid arteries: Secondary | ICD-10-CM

## 2023-11-03 ENCOUNTER — Other Ambulatory Visit: Payer: Self-pay | Admitting: Family Medicine

## 2023-11-03 ENCOUNTER — Other Ambulatory Visit: Payer: Self-pay | Admitting: General Practice

## 2023-11-03 DIAGNOSIS — E781 Pure hyperglyceridemia: Secondary | ICD-10-CM

## 2023-11-03 NOTE — Telephone Encounter (Unsigned)
 Copied from CRM [id number]. Topic: Clinical - Medication Refill >> Nov 03, 2023  5:01 PM Harlene ORN wrote: Medication: ALPRAZolam  (XANAX ) 0.5 MG tablet  Has the patient contacted their pharmacy? Yes (Agent: If no, request that the patient contact the pharmacy for the refill. If patient does not wish to contact the pharmacy document the reason why and proceed with request.) (Agent: If yes, when and what did the pharmacy advise?)  This is the patient's preferred pharmacy:   [facility] [phone] [address], [place] - [address] [place] [address] Phone: [phone] Fax: [phone]  Is this the correct pharmacy for this prescription? Yes If no, delete pharmacy and type the correct one.   Has the prescription been filled recently? Yes  Is the patient out of the medication? Yes  Has the patient been seen for an appointment in the last year OR does the patient have an upcoming appointment? No  Can we respond through MyChart? No  Agent: Please be advised that Rx refills may take up to 3 business days. We ask that you follow-up with your pharmacy.

## 2023-11-06 ENCOUNTER — Telehealth: Payer: Self-pay

## 2023-11-06 ENCOUNTER — Other Ambulatory Visit: Payer: Self-pay | Admitting: Family Medicine

## 2023-11-06 DIAGNOSIS — F419 Anxiety disorder, unspecified: Secondary | ICD-10-CM

## 2023-11-06 NOTE — Telephone Encounter (Signed)
 Copied from CRM [id number]. Topic: Clinical - Prescription Issue >> Nov 06, 2023 12:31 PM Suzen RAMAN wrote: Reason for CRM: Patient daughter called to check on the status of medication refill from the pharmacy for ALPRAZolam  (XANAX ) 0.5 MG tablet. Caller advised that medication is still currently pending and awaiting provider approval. Caller would like a call back with a status update. Patient is currently out of medication and unable to sleep for the past two days with medication.  Verneita Ill [phone]

## 2023-11-09 NOTE — Telephone Encounter (Signed)
 Rx sent 11/06/23 by PCP

## 2023-11-24 ENCOUNTER — Ambulatory Visit: Admitting: Family Medicine

## 2023-11-24 VITALS — BP 140/60 | HR 85 | Temp 98.1°F | Wt 163.0 lb

## 2023-11-24 DIAGNOSIS — L989 Disorder of the skin and subcutaneous tissue, unspecified: Secondary | ICD-10-CM | POA: Diagnosis not present

## 2023-11-24 DIAGNOSIS — J430 Unilateral pulmonary emphysema [MacLeod's syndrome]: Secondary | ICD-10-CM

## 2023-11-24 DIAGNOSIS — I10 Essential (primary) hypertension: Secondary | ICD-10-CM | POA: Diagnosis not present

## 2023-11-24 DIAGNOSIS — M25561 Pain in right knee: Secondary | ICD-10-CM

## 2023-11-24 DIAGNOSIS — G8929 Other chronic pain: Secondary | ICD-10-CM

## 2023-11-24 DIAGNOSIS — M1990 Unspecified osteoarthritis, unspecified site: Secondary | ICD-10-CM

## 2023-11-24 DIAGNOSIS — G4700 Insomnia, unspecified: Secondary | ICD-10-CM | POA: Diagnosis not present

## 2023-11-24 DIAGNOSIS — R059 Cough, unspecified: Secondary | ICD-10-CM

## 2023-11-24 DIAGNOSIS — F419 Anxiety disorder, unspecified: Secondary | ICD-10-CM | POA: Diagnosis not present

## 2023-11-24 DIAGNOSIS — N183 Chronic kidney disease, stage 3 unspecified: Secondary | ICD-10-CM

## 2023-11-24 DIAGNOSIS — E781 Pure hyperglyceridemia: Secondary | ICD-10-CM | POA: Diagnosis not present

## 2023-11-24 DIAGNOSIS — R739 Hyperglycemia, unspecified: Secondary | ICD-10-CM

## 2023-11-24 DIAGNOSIS — K117 Disturbances of salivary secretion: Secondary | ICD-10-CM

## 2023-11-24 LAB — POCT RAPID STREP A (OFFICE): Rapid Strep A Screen: POSITIVE — AB

## 2023-11-24 LAB — POC COVID19 BINAXNOW: SARS Coronavirus 2 Ag: NEGATIVE

## 2023-11-24 MED ORDER — GABAPENTIN 300 MG PO CAPS: 300.0000 mg | ORAL_CAPSULE | Freq: Three times a day (TID) | ORAL | 3 refills | Status: AC

## 2023-11-24 MED ORDER — AMOXICILLIN-POT CLAVULANATE 875-125 MG PO TABS: 1.0000 | ORAL_TABLET | Freq: Two times a day (BID) | ORAL | 0 refills | Status: AC

## 2023-11-24 MED ORDER — METHYLPREDNISOLONE ACETATE 40 MG/ML IJ SUSP
40.0000 mg | Freq: Once | INTRAMUSCULAR | Status: AC
  Administered 2023-11-24: 80 mg via INTRAMUSCULAR

## 2023-11-24 NOTE — Assessment & Plan Note (Signed)
 He is on pravastatin  80 mg daily.  Check lipids.

## 2023-11-24 NOTE — Assessment & Plan Note (Signed)
 No red flags.  We we will check labs today.  Did discuss potential treatment options however recommended against this due to potential side effects.  Recommended gum or hard candies to help with secretion management.

## 2023-11-24 NOTE — Assessment & Plan Note (Signed)
 Symptoms are still not controlled.  He is currently on Remeron  45 mg daily, gabapentin  200 mg daily, Seroquel  50 mg nightly and Xanax  as needed.  We discussed adjusting dose of medications.  Due to his ongoing issues with osteoarthritis pain would be reasonable for us  to increase dose of gabapentin .  Will go to 300 mg nightly.  They will follow-up with us  in a few weeks and MyChart we can adjust as needed.

## 2023-11-24 NOTE — Assessment & Plan Note (Signed)
 Mildly elevated today though typically well-controlled on on regimen per cardiology with hydralazine  10 mg twice daily, amlodipine  10 mg daily, and lisinopril  40 mg daily.

## 2023-11-24 NOTE — Assessment & Plan Note (Signed)
 Overall his anxiety is well-controlled with Remeron  45 mg nightly and alprazolam  as needed nightly.

## 2023-11-24 NOTE — Progress Notes (Signed)
 [name] [name] is a 89 y.o. male who presents today for an office visit.  Assessment/Plan:  New/Acute Problems: Cough  Consistent with mild COPD flare.  Overall reassuring exam without any signs of respiratory distress and he is satting at 95% on room air.  Will start Augmentin.  Give 80 mg of Depo-Medrol  today.  He has tolerated this well in the past.  We also did discuss starting albuterol inhaler however he would like to hold off on this for now.  We discussed reasons to return to care  Fall Patient [place] the beach couple [place].  He attributes this to being in an unfamiliar place when he is trying to find his way around in the dark.  Did not have any significant injuries from the fall.  Has not had any falls since then.  We did discuss referral to see physical therapy however they declined.  They will let us  know if he has any recurrence.  Skin lesion Improved in appearance compared to last year however still persistent since cryotherapy a year ago.  Differential includes a large actinic keratosis versus keratoacanthoma.  We did discuss referral to dermatology for excisional biopsy and further management however he would like to pursue repeat cryotherapy.  This was performed today.  See below procedure note.  If not improving will need referral to dermatology.   Chronic Problems Addressed Today: Insomnia Symptoms are still not controlled.  He is currently on Remeron  45 mg daily, gabapentin  200 mg daily, Seroquel  50 mg nightly and Xanax  as needed.  We discussed adjusting dose of medications.  Due to his ongoing issues with osteoarthritis pain would be reasonable for us  to increase dose of gabapentin .  Will go to 300 mg nightly.  They will follow-up with us  in a few weeks and MyChart we can adjust as needed.  COPD with emphysema (HCC) Patient with mild flare as above.  We are treating with Depo-Medrol  injection and Augmentin.  Discussed starting albuterol inhaler and airway  declined.  Anxiety Overall his anxiety is well-controlled with Remeron  45 mg nightly and alprazolam  as needed nightly.  CKD (chronic kidney disease) stage 3, GFR 30-59 ml/min (HCC) Check c-Met with labs.  Essential hypertension Mildly elevated today though typically well-controlled on on regimen per cardiology with hydralazine  10 mg twice daily, amlodipine  10 mg daily, and lisinopril  40 mg daily.  HLD (hyperlipidemia) He is on pravastatin  80 mg daily.  Check lipids.  Osteoarthritis Continue management per orthopedics.  We are increasing his dose of gabapentin  as above which hopefully should help some with his pain management.  Sialorrhea No red flags.  We we will check labs today.  Did discuss potential treatment options however recommended against this due to potential side effects.  Recommended gum or hard candies to help with secretion management.     Subjective:  HPI:  See assessment / plan for status of chronic conditions.   Discussed the use of AI scribe software for clinical note transcription with the patient, who gave verbal consent to proceed.  History of Present Illness [name] [name] is an 89 year old male who presents with cough and congestion.  He has been experiencing cough and congestion for approximately one week, which began after attending a surprise party. He woke up with runny eyes, a sore throat, and a cough the morning after the event. Several other attendees experienced similar symptoms. He has been coughing up green phlegm, which causes shortness of breath and difficulty breathing  when expectorating. No wheezing unless actively coughing up phlegm.  He has a history of knee pain described as 'bone to bone' and recently received a steroid injection and had fluid drawn from the knee, which provided temporary relief. He is currently taking gabapentin  200 mg.  He experiences difficulty falling asleep, often lying awake until 3 or 4 AM despite taking his  medication before bed. He is currently on Seroquel , which he feels may not be as effective as it once was.  He has been experiencing excessive drooling over the past six months, which he describes as 'slobbering' that wets his neck. No heartburn, reflux, or indigestion. He has not noticed this symptom since developing his current cold.  He had a fall in early August while at the beach, which resulted in minor skin abrasions but no significant injuries. He attributes the fall to unfamiliar surroundings and poor lighting.  He inquires about the use of turmeric for arthritis pain, as recommended by his brother.  Does not need any other refills today.  He has a skin lesion on his left arm that we froze last year.  He does think it is improved in characteristic since last year however did not fall off as anticipated.         Objective:  Physical Exam: BP (!) 140/60   Pulse 85   Temp 98.1 F (36.7 C) (Temporal)   Wt 163 lb (73.9 kg)   SpO2 95%   BMI 26.31 kg/m   Gen: No acute distress, resting comfortably CV: Regular rate and rhythm with no murmurs appreciated Pulm: Normal work of breathing, clear to auscultation bilaterally with no crackles, wheezes, or rhonchi Skin: Large approximately 1.5 cm keratotic lesion on left lateral arm. Neuro: Grossly normal, moves all extremities Psych: Normal affect and thought content  Cryotherapy Procedure Note  Pre-operative Diagnosis: Suspicious lesion  Locations: Left Arm  Indications: Therapeutic  Procedure Details  Patient informed of risks (permanent scarring, infection, light or dark discoloration, bleeding, infection, weakness, numbness and recurrence of the lesion) and benefits of the procedure and verbal informed consent obtained.  The areas are treated with liquid nitrogen therapy, frozen until ice ball extended 3 mm beyond lesion, allowed to thaw, and treated again. The patient tolerated procedure well.  The patient was instructed on  post-op care, warned that there may be blister formation, redness and pain. Recommend OTC analgesia as needed for pain.  Condition: Stable  Complications: none.  Time Spent: 45 minutes of total time was spent on the date of the encounter performing the following actions: chart review prior to seeing the patient, obtaining history, performing a medically necessary exam, counseling on the treatment plan, placing orders, and documenting in our EHR. This does not include time spent performing above procedure.        Worth HERO. Kennyth, MD 11/24/2023 3:19 PM

## 2023-11-24 NOTE — Assessment & Plan Note (Signed)
 Check c-Met with labs.

## 2023-11-24 NOTE — Assessment & Plan Note (Signed)
 Patient with mild flare as above.  We are treating with Depo-Medrol  injection and Augmentin.  Discussed starting albuterol inhaler and airway declined.

## 2023-11-24 NOTE — Patient Instructions (Signed)
 It was very nice to see you today!  VISIT SUMMARY: During your visit, we addressed your cough and congestion, knee pain, difficulty sleeping, excessive drooling, and a skin lesion. We provided treatments and made adjustments to your medications to help manage these issues.  YOUR PLAN: ACUTE COUGH WITH CONGESTION AND STREPTOCOCCAL PHARYNGITIS: You have a cough and congestion with a positive strep test, suggesting potential bronchitis. -We gave you a cortisone shot to help with your symptoms. -Take Augmentin 875 mg, one tablet twice daily for 10 days.  OSTEOARTHRITIS OF KNEE: You have chronic knee pain that temporarily improves with steroid injections. -We will increase your gabapentin  to 300 mg at night to help with pain and sleep. -You may also try a turmeric supplement for pain relief.  INSOMNIA: You have difficulty falling asleep, and your current medication may not be effective. -We will increase your Seroquel  dosage to help improve your sleep.  EXCESSIVE SALIVATION (SIALORRHEA): You have been experiencing excessive drooling, likely due to age-related changes. -Try using hard candy or gum to help reduce the drooling.  BENIGN SKIN LESION: You have a recurrent skin lesion that is likely seborrheic keratosis or a wart. -We treated the lesion by freezing it with cold spray.  Return if symptoms worsen or fail to improve.   Take care, Dr Kennyth  PLEASE NOTE:  If you had any lab tests, please let us  know if you have not heard back within a few days. You may see your results on mychart before we have a chance to review them but we will give you a call once they are reviewed by us .   If we ordered any referrals today, please let us  know if you have not heard from their office within the next week.   If you had any urgent prescriptions sent in today, please check with the pharmacy within an hour of our visit to make sure the prescription was transmitted appropriately.   Please try these  tips to maintain a healthy lifestyle:  Eat at least 3 REAL meals and 1-2 snacks per day.  Aim for no more than 5 hours between eating.  If you eat breakfast, please do so within one hour of getting up.   Each meal should contain half fruits/vegetables, one quarter protein, and one quarter carbs (no bigger than a computer mouse)  Cut down on sweet beverages. This includes juice, soda, and sweet tea.   Drink at least 1 glass of water with each meal and aim for at least 8 glasses per day  Exercise at least 150 minutes every week.

## 2023-11-24 NOTE — Assessment & Plan Note (Signed)
 Continue management per orthopedics.  We are increasing his dose of gabapentin  as above which hopefully should help some with his pain management.

## 2023-11-25 ENCOUNTER — Telehealth: Payer: Self-pay | Admitting: *Deleted

## 2023-11-25 ENCOUNTER — Other Ambulatory Visit: Payer: Self-pay | Admitting: *Deleted

## 2023-11-25 DIAGNOSIS — E875 Hyperkalemia: Secondary | ICD-10-CM

## 2023-11-25 LAB — CBC
HCT: 39.8 % (ref 39.0–52.0)
Hemoglobin: 13.1 g/dL (ref 13.0–17.0)
MCHC: 33 g/dL (ref 30.0–36.0)
MCV: 95.3 fl (ref 78.0–100.0)
Platelets: 311 K/uL (ref 150.0–400.0)
RBC: 4.17 Mil/uL — ABNORMAL LOW (ref 4.22–5.81)
RDW: 13.3 % (ref 11.5–15.5)
WBC: 10.9 K/uL — ABNORMAL HIGH (ref 4.0–10.5)

## 2023-11-25 LAB — TSH: TSH: 1.02 u[IU]/mL (ref 0.35–5.50)

## 2023-11-25 LAB — COMPREHENSIVE METABOLIC PANEL WITH GFR
ALT: 9 U/L (ref 0–53)
AST: 20 U/L (ref 0–37)
Albumin: 4.2 g/dL (ref 3.5–5.2)
Alkaline Phosphatase: 61 U/L (ref 39–117)
BUN: 15 mg/dL (ref 6–23)
CO2: 27 meq/L (ref 19–32)
Calcium: 9.5 mg/dL (ref 8.4–10.5)
Chloride: 92 meq/L — ABNORMAL LOW (ref 96–112)
Creatinine, Ser: 1.37 mg/dL (ref 0.40–1.50)
GFR: 46.44 mL/min — ABNORMAL LOW (ref >60.00)
Glucose, Bld: 126 mg/dL — ABNORMAL HIGH (ref 70–99)
Potassium: 7.5 meq/L (ref 3.5–5.1)
Sodium: 128 meq/L — ABNORMAL LOW (ref 135–145)
Total Bilirubin: 0.5 mg/dL (ref 0.2–1.2)
Total Protein: 7.9 g/dL (ref 6.0–8.3)

## 2023-11-25 LAB — LIPID PANEL
Cholesterol: 159 mg/dL (ref 0–200)
HDL: 70.6 mg/dL (ref >39.00)
LDL Cholesterol: 77 mg/dL (ref 0–99)
NonHDL: 88.41
Total CHOL/HDL Ratio: 2
Triglycerides: 57 mg/dL (ref 0.0–149.0)
VLDL: 11.4 mg/dL (ref 0.0–40.0)

## 2023-11-25 LAB — HEMOGLOBIN A1C: Hgb A1c MFr Bld: 5.5 % (ref 4.6–6.5)

## 2023-11-25 NOTE — Telephone Encounter (Signed)
 Spoke with patient daughter, stated will Bring Patient to redraw blood work tomorrow or Friday

## 2023-11-25 NOTE — Telephone Encounter (Signed)
 [facility]ice [facility] call with critical report  Potassium 7.5 Blood possible partial hemolyzed per Darice Sermon with Dr Kennyth, Dr Kennyth recommend to repeat blood test  Molly from [place] at [facility] lab spoke with Patient Daughter this morning for recollection Daughter stated patient is unable to come for blood work till Friday.. Send email with [facility] address to patient Daughter   LVM at [phone] return call

## 2023-11-26 ENCOUNTER — Other Ambulatory Visit

## 2023-11-26 ENCOUNTER — Ambulatory Visit: Payer: Self-pay | Admitting: Family Medicine

## 2023-11-26 DIAGNOSIS — E875 Hyperkalemia: Secondary | ICD-10-CM

## 2023-11-26 NOTE — Progress Notes (Signed)
 Please see phone note regarding his potassium.  They are coming back to recheck this ASAP.  His white blood cell count was elevated however this is probably due to the his recent infection.  We can check this again when he comes back for his next visit.  All his other labs are at goal and we can recheck in a year.

## 2023-11-27 LAB — COMPLETE METABOLIC PANEL WITHOUT GFR
AG Ratio: 1.2 (calc) (ref 1.0–2.5)
ALT: 11 U/L (ref 9–46)
AST: 19 U/L (ref 10–35)
Albumin: 3.8 g/dL (ref 3.6–5.1)
Alkaline phosphatase (APISO): 55 U/L (ref 35–144)
BUN/Creatinine Ratio: 17 (calc) (ref 6–22)
BUN: 25 mg/dL (ref 7–25)
CO2: 25 mmol/L (ref 20–32)
Calcium: 9.6 mg/dL (ref 8.6–10.3)
Chloride: 97 mmol/L — ABNORMAL LOW (ref 98–110)
Creat: 1.48 mg/dL — ABNORMAL HIGH (ref 0.70–1.22)
Globulin: 3.1 g/dL (ref 1.9–3.7)
Glucose, Bld: 119 mg/dL — ABNORMAL HIGH (ref 65–99)
Potassium: 4.2 mmol/L (ref 3.5–5.3)
Sodium: 132 mmol/L — ABNORMAL LOW (ref 135–146)
Total Bilirubin: 0.3 mg/dL (ref 0.2–1.2)
Total Protein: 6.9 g/dL (ref 6.1–8.1)

## 2023-11-28 ENCOUNTER — Other Ambulatory Visit: Payer: Self-pay | Admitting: Family Medicine

## 2023-11-28 DIAGNOSIS — I10 Essential (primary) hypertension: Secondary | ICD-10-CM

## 2023-11-30 ENCOUNTER — Ambulatory Visit: Payer: Self-pay | Admitting: Family Medicine

## 2023-11-30 NOTE — Progress Notes (Signed)
 Sodium and potassium levels are trending back to normal.  Do not need to do any other testing at this point though we can recheck at his next office visit.

## 2023-12-09 ENCOUNTER — Emergency Department (HOSPITAL_COMMUNITY)

## 2023-12-09 ENCOUNTER — Other Ambulatory Visit: Payer: Self-pay

## 2023-12-09 ENCOUNTER — Emergency Department (HOSPITAL_COMMUNITY)
Admission: EM | Admit: 2023-12-09 | Discharge: 2023-12-09 | Disposition: A | Discharge status: Home / Self Care | Attending: Emergency Medicine | Admitting: Emergency Medicine

## 2023-12-09 ENCOUNTER — Encounter (HOSPITAL_COMMUNITY): Payer: Self-pay

## 2023-12-09 DIAGNOSIS — D72829 Elevated white blood cell count, unspecified: Secondary | ICD-10-CM | POA: Insufficient documentation

## 2023-12-09 DIAGNOSIS — R1030 Lower abdominal pain, unspecified: Secondary | ICD-10-CM | POA: Diagnosis not present

## 2023-12-09 DIAGNOSIS — K5900 Constipation, unspecified: Secondary | ICD-10-CM | POA: Diagnosis not present

## 2023-12-09 DIAGNOSIS — Z7982 Long term (current) use of aspirin: Secondary | ICD-10-CM | POA: Insufficient documentation

## 2023-12-09 DIAGNOSIS — R339 Retention of urine, unspecified: Secondary | ICD-10-CM | POA: Insufficient documentation

## 2023-12-09 LAB — URINALYSIS, ROUTINE W REFLEX MICROSCOPIC
Bilirubin Urine: NEGATIVE
Glucose, UA: NEGATIVE mg/dL
Hgb urine dipstick: NEGATIVE
Ketones, ur: NEGATIVE mg/dL
Leukocytes,Ua: NEGATIVE
Nitrite: NEGATIVE
Protein, ur: 100 mg/dL — AB
Specific Gravity, Urine: 1.015 (ref 1.005–1.030)
pH: 5 (ref 5.0–8.0)

## 2023-12-09 LAB — I-STAT CHEM 8, ED
BUN: 14 mg/dL (ref 8–23)
Calcium, Ion: 1.24 mmol/L (ref 1.15–1.40)
Chloride: 97 mmol/L — ABNORMAL LOW (ref 98–111)
Creatinine, Ser: 1.3 mg/dL — ABNORMAL HIGH (ref 0.61–1.24)
Glucose, Bld: 130 mg/dL — ABNORMAL HIGH (ref 70–99)
HCT: 46 % (ref 39.0–52.0)
Hemoglobin: 15.6 g/dL (ref 13.0–17.0)
Potassium: 4.3 mmol/L (ref 3.5–5.1)
Sodium: 130 mmol/L — ABNORMAL LOW (ref 135–145)
TCO2: 22 mmol/L (ref 22–32)

## 2023-12-09 LAB — CBC WITH DIFFERENTIAL/PLATELET
Abs Immature Granulocytes: 0.11 K/uL — ABNORMAL HIGH (ref 0.00–0.07)
Basophils Absolute: 0.1 K/uL (ref 0.0–0.1)
Basophils Relative: 0 %
Eosinophils Absolute: 0.1 K/uL (ref 0.0–0.5)
Eosinophils Relative: 0 %
HCT: 42.1 % (ref 39.0–52.0)
Hemoglobin: 14.3 g/dL (ref 13.0–17.0)
Immature Granulocytes: 1 %
Lymphocytes Relative: 4 %
Lymphs Abs: 0.8 K/uL (ref 0.7–4.0)
MCH: 31.8 pg (ref 26.0–34.0)
MCHC: 34 g/dL (ref 30.0–36.0)
MCV: 93.8 fL (ref 80.0–100.0)
Monocytes Absolute: 1.2 K/uL — ABNORMAL HIGH (ref 0.1–1.0)
Monocytes Relative: 6 %
Neutro Abs: 18.6 K/uL — ABNORMAL HIGH (ref 1.7–7.7)
Neutrophils Relative %: 89 %
Platelets: 404 K/uL — ABNORMAL HIGH (ref 150–400)
RBC: 4.49 MIL/uL (ref 4.22–5.81)
RDW: 13.2 % (ref 11.5–15.5)
WBC: 20.9 K/uL — ABNORMAL HIGH (ref 4.0–10.5)
nRBC: 0 % (ref 0.0–0.2)

## 2023-12-09 LAB — COMPREHENSIVE METABOLIC PANEL WITH GFR
ALT: 20 U/L (ref 0–44)
AST: 28 U/L (ref 15–41)
Albumin: 4 g/dL (ref 3.5–5.0)
Alkaline Phosphatase: 77 U/L (ref 38–126)
Anion gap: 10 (ref 5–15)
BUN: 13 mg/dL (ref 8–23)
CO2: 21 mmol/L — ABNORMAL LOW (ref 22–32)
Calcium: 9.4 mg/dL (ref 8.9–10.3)
Chloride: 96 mmol/L — ABNORMAL LOW (ref 98–111)
Creatinine, Ser: 1.34 mg/dL — ABNORMAL HIGH (ref 0.61–1.24)
GFR, Estimated: 51 mL/min — ABNORMAL LOW (ref >60)
Glucose, Bld: 129 mg/dL — ABNORMAL HIGH (ref 70–99)
Potassium: 4.2 mmol/L (ref 3.5–5.1)
Sodium: 127 mmol/L — ABNORMAL LOW (ref 135–145)
Total Bilirubin: 0.9 mg/dL (ref 0.0–1.2)
Total Protein: 8.3 g/dL — ABNORMAL HIGH (ref 6.5–8.1)

## 2023-12-09 MED ORDER — GLYCERIN (ADULT) 2 G RE SUPP: 1.0000 | RECTAL | 0 refills | Status: AC | PRN

## 2023-12-09 MED ORDER — POLYETHYLENE GLYCOL 3350 17 GM/SCOOP PO POWD: 17.0000 g | Freq: Every day | ORAL | 0 refills | Status: AC

## 2023-12-09 MED ORDER — MAGNESIUM CITRATE PO SOLN: 1.0000 | Freq: Once | ORAL | 0 refills | Status: AC

## 2023-12-09 NOTE — ED Triage Notes (Addendum)
 Pt has not urinated in over 12 hours and is c/o of lower abdominal pain

## 2023-12-09 NOTE — ED Provider Notes (Signed)
 [place] EMERGENCY DEPARTMENT AT [facility] Provider Note   CSN: [id number] Arrival date & time: 12/09/23  9190     Patient presents with: Urinary Retention   [name] [name] is a 89 y.o. male.   HPI   89 year old male presents emergency department with concern for urinary retention.  Patient states he was last able to void urine the night before last.  Complains of some fullness in the lower abdomen.  He has chronic constipation which is ongoing.  But he otherwise denies any fever, chills, vomiting, diarrhea.  He has no acute abdominal/flank pain.  Has otherwise been compliant with medications.  Prior to Admission medications   Medication Sig Start Date End Date Taking? Authorizing Provider  ALPRAZolam  (XANAX ) 0.5 MG tablet TAKE 1 TABLET BY MOUTH AT BEDTIME AS NEEDED FOR ANXIETY 11/06/23   Kennyth Worth HERO, MD  amLODipine  (NORVASC ) 10 MG tablet Take 1 tablet (10 mg total) by mouth daily. 04/24/23   Emelia Josefa HERO, NP  amoxicillin-clavulanate (AUGMENTIN) 875-125 MG tablet Take 1 tablet by mouth 2 (two) times daily. 11/24/23   Parker, Caleb M, MD  aspirin 81 MG tablet Take 81 mg by mouth daily.    [provider]  gabapentin  (NEURONTIN ) 300 MG capsule Take 1 capsule (300 mg total) by mouth 3 (three) times daily. 11/24/23   Kennyth Worth HERO, MD  hydrALAZINE  (APRESOLINE ) 10 MG tablet Take 1 tablet (10 mg total) by mouth 2 (two) times daily. 04/24/23   Emelia Josefa HERO, NP  lisinopril  (ZESTRIL ) 40 MG tablet TAKE 1 TABLET BY MOUTH EVERY DAY 11/30/23   Kennyth Worth HERO, MD  mirtazapine  (REMERON ) 45 MG tablet TAKE 1 TABLET BY MOUTH EVERYDAY AT BEDTIME 09/03/23   Kennyth Worth HERO, MD  nitroGLYCERIN  (NITROSTAT ) 0.4 MG SL tablet PLACE 1 TABLET UNDER THE TONGUE EVERY 5 MINUTES AS NEEDED FOR CHEST PAIN. 10/15/23   Emelia Josefa HERO, NP  pravastatin  (PRAVACHOL ) 80 MG tablet TAKE 1 TABLET BY MOUTH EVERY DAY 11/04/23   Kennyth Worth HERO, MD  QUEtiapine  (SEROQUEL ) 50 MG tablet TAKE 1 TABLET BY  MOUTH EVERYDAY AT BEDTIME 06/08/23   Kennyth Worth HERO, MD    Allergies: Patient has no known allergies.    Review of Systems  Constitutional:  Positive for fatigue. Negative for fever.  Respiratory:  Negative for shortness of breath.   Cardiovascular:  Negative for chest pain.  Gastrointestinal:  Negative for abdominal distention, abdominal pain, diarrhea and vomiting.       + Suprapubic fullness  Genitourinary:  Positive for difficulty urinating. Negative for penile swelling, scrotal swelling and testicular pain.  Skin:  Negative for rash.  Neurological:  Negative for headaches.    Updated Vital Signs BP (!) 129/57 (BP Location: Right Arm)   Pulse 66   Temp (!) 97.4 F (36.3 C) (Oral)   Resp 18   Ht 5' 7 (1.702 m)   Wt 74.8 kg   SpO2 100%   BMI 25.84 kg/m   Physical Exam Vitals and nursing note reviewed.  Constitutional:      General: He is not in acute distress.    Appearance: Normal appearance.  HENT:     Head: Normocephalic.     Mouth/Throat:     Mouth: Mucous membranes are moist.  Cardiovascular:     Rate and Rhythm: Normal rate.  Pulmonary:     Effort: Pulmonary effort is normal. No respiratory distress.  Abdominal:     Palpations: Abdomen is soft.  Tenderness: There is no abdominal tenderness. There is no right CVA tenderness or left CVA tenderness.     Comments: Suprapubic firmness  Skin:    General: Skin is warm.  Neurological:     Mental Status: He is alert and oriented to person, place, and time. Mental status is at baseline.  Psychiatric:        Mood and Affect: Mood normal.     (all labs ordered are listed, but only abnormal results are displayed) Labs Reviewed  CBC WITH DIFFERENTIAL/PLATELET - Abnormal; Notable for the following components:      Result Value   WBC 20.9 (*)    Platelets 404 (*)    Neutro Abs 18.6 (*)    Monocytes Absolute 1.2 (*)    Abs Immature Granulocytes 0.11 (*)    All other components within normal limits  I-STAT  CHEM 8, ED - Abnormal; Notable for the following components:   Sodium 130 (*)    Chloride 97 (*)    Creatinine, Ser 1.30 (*)    Glucose, Bld 130 (*)    All other components within normal limits  COMPREHENSIVE METABOLIC PANEL WITH GFR  URINALYSIS, ROUTINE W REFLEX MICROSCOPIC    EKG: None  Radiology: No results found.   Procedures   Medications Ordered in the ED - No data to display                                  Medical Decision Making Amount and/or Complexity of Data Reviewed Labs: ordered. Radiology: ordered.  Risk OTC drugs.   89 year old male presents emergency department with concern for difficulty with urination.  Was last able to pass urine yesterday.  No history of urinary retention in the past.  Bedside bladder scan shows over 600 mL of urine.  Foley will be placed.  Vitals are stable, patient is afebrile.  Will plan for lab evaluation with urinalysis as well as abdominal x-ray and renal ultrasound.  Blood work is reassuring, renal function is normal.  Urinalysis shows no new urine tract infection.  There is note of leukocytosis but patient recently completed a steroid regimen for strep throat.  X-ray shows constipation, no obstruction, no s/s impaction.  Discussed with the patient multiple constipation regimens.  We will attempt suppositories and MiraLAX cleanout and possible magnesium citrate if needed.  Patient and daughter at bedside are comfortable with this discharge plan.  Foley will stay in place and will follow-up with urology as an outpatient.  Patient at this time appears safe and stable for discharge and close outpatient follow up. Discharge plan and strict return to ED precautions discussed, patient verbalizes understanding and agreement.     Final diagnoses:  None    ED Discharge Orders     None          Bari Roxie HERO, DO 12/09/23 1604

## 2023-12-09 NOTE — ED Triage Notes (Signed)
 Pt reports he has been unable to urinate since yesterday. Denies pain, but says theres pressure, feels like he has to go, thinks it is leaking out of him.

## 2023-12-09 NOTE — ED Notes (Signed)
 NT bladder scanned pt pt is retaining of urine in bladder

## 2023-12-09 NOTE — Discharge Instructions (Signed)
 You have been seen and discharged from the emergency department.  You were found to have urinary retention and constipation.  You were treated with a Foley catheter.  This will stay in place until you follow-up as an outpatient with urology.  Please call their office to schedule an appointment today.  In regards to the constipation we have chosen a suppository MiraLAX regimen.    MiraLAX cleanout is as follows.  Mix half of a MiraLAX bottle (119 g) and a 32 ounce Gatorade bottle until dissolved.   Drink 8 ounces of this mixture every 15 to 30 minutes until you achieve relief.  If after the first 32 ounces of Gatorade you have not achieved relief you may mix the remainder of the MiraLAX bottle in the second 32 ounce of Gatorade and drink this in 8 ounce increments as well.  Once you have achieved relief he may continue a dose of MiraLAX daily to help with continued relief.    You have also been prescribed suppositories.  Please use 1 in conjunction with the MiraLAX cleanout and then use afterwards as needed.  If you are unsuccessful with this treatment in the next couple days you may use magnesium citrate.  Follow-up with your primary doctor.  Take home medications as prescribed. If you have any worsening symptoms or further concerns for your health please return to an emergency department for further evaluation.

## 2023-12-10 ENCOUNTER — Telehealth: Payer: Self-pay | Admitting: Family Medicine

## 2023-12-10 NOTE — Telephone Encounter (Signed)
 Pt was seen in ED on 12/09/23  Patient Name First: [name] Last: [name] Gender: Male DOB: Jul 01, 1934 Age: 89 Y 4 M 18 D Return Phone Number: [phone] (Primary) Address: City/ State/ Zip: [place] [address] Client [facility] - Human resources officer [facility] Provider Kennyth Bart- MD Contact Type Call Who Is Calling Patient / Member / Family / Caregiver Call Type Triage / Clinical Caller Name Verneita Ill Caller Phone Number [phone] Relationship To Patient Daughter Return Phone Number [phone] (Primary) Chief Complaint URINATE - sudden inability to urinate Reason for Call Symptomatic / Request for Health Information Initial Comment Caller says the patient has not urinated in over 8 hours and he is constipated. Translation No Nurse Assessment Nurse: Easter, RN, Timothy Date/Time (Eastern Time): 12/09/2023 6:17:16 AM Confirm and document reason for call. If symptomatic, describe symptoms. ---Caller states he has not had a bowel movement in over two day, he has incontinence, and he has not urinated in 12 hours. Caller states his bladder is plum full. Does the patient have any new or worsening symptoms? ---Yes Will a triage be completed? ---Yes Related visit to physician within the last 2 weeks? ---No Does the PT have any chronic conditions? (i.e. diabetes, asthma, this includes High risk factors for pregnancy, etc.) ---No Is this a behavioral health or substance abuse call? ---No Guidelines Guideline Title Affirmed Question Affirmed Notes Nurse Date/Time (Eastern Time) Urinary Symptoms [1] Unable to urinate (or only a few drops) > 4 hours AND [2] bladder feels very full (e.g., palpable [name], RN, [name] 12/09/2023 6:18:46 AM Guidelines Guideline Title Affirmed Question Affirmed Notes Nurse Date/Time (Eastern Time) bladder or strong urge to urinate) Disp. Time [name] Time)  Disposition Final User 12/09/2023 6:11:50 AM Send to Urgent Queue Adine Rattan 12/09/2023 6:22:50 AM Go to ED Now Yes Easter, RN, [name] Final Disposition 12/09/2023 6:22:50 AM Go to ED Now Yes Easter, RN, [name] [name] Disagree/Comply Comply Caller Understands Yes PreDisposition InappropriateToAsk Care Advice Given Per Guideline GO TO ED NOW: * You need to be seen in the Emergency Department. * Go to the ED at ___________ Hospital. * Leave now. Drive carefully. CARE ADVICE given per Urinary Symptoms (Adult) guideline. Referrals [facility] - ED

## 2023-12-11 NOTE — Telephone Encounter (Signed)
 See note

## 2023-12-15 ENCOUNTER — Other Ambulatory Visit: Payer: Self-pay | Admitting: General Practice

## 2024-01-31 ENCOUNTER — Other Ambulatory Visit: Payer: Self-pay | Admitting: General Practice

## 2024-01-31 ENCOUNTER — Other Ambulatory Visit: Payer: Self-pay | Admitting: Family Medicine

## 2024-02-06 ENCOUNTER — Other Ambulatory Visit: Payer: Self-pay | Admitting: General Practice

## 2024-02-09 ENCOUNTER — Other Ambulatory Visit: Payer: Self-pay | Admitting: Family Medicine

## 2024-02-09 DIAGNOSIS — E781 Pure hyperglyceridemia: Secondary | ICD-10-CM

## 2024-02-26 ENCOUNTER — Other Ambulatory Visit: Payer: Self-pay | Admitting: Family Medicine

## 2024-02-26 DIAGNOSIS — I10 Essential (primary) hypertension: Secondary | ICD-10-CM
